# Patient Record
Sex: Female | Born: 1950 | Race: White | Hispanic: No | Marital: Married | Smoking: Never smoker
Health system: Southern US, Community
[De-identification: ages and names within clinical notes are randomized; demographics above are authoritative.]

## PROBLEM LIST (undated history)

## (undated) DIAGNOSIS — N200 Calculus of kidney: Secondary | ICD-10-CM

## (undated) DIAGNOSIS — E785 Hyperlipidemia, unspecified: Secondary | ICD-10-CM

## (undated) DIAGNOSIS — M1990 Unspecified osteoarthritis, unspecified site: Secondary | ICD-10-CM

## (undated) DIAGNOSIS — J189 Pneumonia, unspecified organism: Secondary | ICD-10-CM

## (undated) DIAGNOSIS — D369 Benign neoplasm, unspecified site: Secondary | ICD-10-CM

## (undated) DIAGNOSIS — H269 Unspecified cataract: Secondary | ICD-10-CM

## (undated) DIAGNOSIS — K219 Gastro-esophageal reflux disease without esophagitis: Secondary | ICD-10-CM

## (undated) DIAGNOSIS — B019 Varicella without complication: Secondary | ICD-10-CM

## (undated) DIAGNOSIS — T8859XA Other complications of anesthesia, initial encounter: Secondary | ICD-10-CM

## (undated) DIAGNOSIS — T7840XA Allergy, unspecified, initial encounter: Secondary | ICD-10-CM

## (undated) DIAGNOSIS — I10 Essential (primary) hypertension: Secondary | ICD-10-CM

## (undated) HISTORY — DX: Pneumonia, unspecified organism: J18.9

## (undated) HISTORY — PX: SPINE SURGERY: SHX786

## (undated) HISTORY — DX: Calculus of kidney: N20.0

## (undated) HISTORY — DX: Gastro-esophageal reflux disease without esophagitis: K21.9

## (undated) HISTORY — PX: EYE SURGERY: SHX253

## (undated) HISTORY — DX: Varicella without complication: B01.9

## (undated) HISTORY — DX: Essential (primary) hypertension: I10

## (undated) HISTORY — DX: Unspecified cataract: H26.9

## (undated) HISTORY — DX: Benign neoplasm, unspecified site: D36.9

## (undated) HISTORY — DX: Allergy, unspecified, initial encounter: T78.40XA

## (undated) HISTORY — DX: Hyperlipidemia, unspecified: E78.5

## (undated) HISTORY — DX: Unspecified osteoarthritis, unspecified site: M19.90

---

## 2004-11-15 ENCOUNTER — Ambulatory Visit: Payer: Self-pay | Admitting: Unknown Physician Specialty

## 2004-11-17 ENCOUNTER — Ambulatory Visit: Payer: Self-pay | Admitting: Urology

## 2004-11-18 ENCOUNTER — Ambulatory Visit: Payer: Self-pay | Admitting: Urology

## 2005-12-06 ENCOUNTER — Ambulatory Visit: Payer: Self-pay | Admitting: Unknown Physician Specialty

## 2006-12-05 ENCOUNTER — Ambulatory Visit: Payer: Self-pay | Admitting: Gastroenterology

## 2006-12-07 ENCOUNTER — Ambulatory Visit: Payer: Self-pay | Admitting: Unknown Physician Specialty

## 2007-12-11 ENCOUNTER — Ambulatory Visit: Payer: Self-pay | Admitting: Unknown Physician Specialty

## 2008-02-28 ENCOUNTER — Ambulatory Visit: Payer: Self-pay | Admitting: Specialist

## 2008-05-21 ENCOUNTER — Ambulatory Visit: Payer: Self-pay | Admitting: Specialist

## 2008-10-24 DIAGNOSIS — D369 Benign neoplasm, unspecified site: Secondary | ICD-10-CM

## 2008-10-24 HISTORY — DX: Benign neoplasm, unspecified site: D36.9

## 2008-12-15 ENCOUNTER — Ambulatory Visit: Payer: Self-pay | Admitting: Internal Medicine

## 2009-12-17 ENCOUNTER — Ambulatory Visit: Payer: Self-pay | Admitting: Internal Medicine

## 2010-10-04 ENCOUNTER — Ambulatory Visit: Payer: Self-pay | Admitting: Specialist

## 2010-11-19 ENCOUNTER — Ambulatory Visit: Payer: Self-pay | Admitting: Anesthesiology

## 2010-12-08 ENCOUNTER — Ambulatory Visit: Payer: Self-pay | Admitting: Anesthesiology

## 2010-12-18 ENCOUNTER — Ambulatory Visit: Payer: Self-pay | Admitting: Surgery

## 2010-12-21 ENCOUNTER — Ambulatory Visit: Payer: Self-pay | Admitting: Surgery

## 2010-12-21 ENCOUNTER — Ambulatory Visit: Payer: Self-pay | Admitting: Internal Medicine

## 2010-12-22 ENCOUNTER — Ambulatory Visit: Payer: Self-pay | Admitting: Urology

## 2011-03-25 ENCOUNTER — Other Ambulatory Visit (HOSPITAL_COMMUNITY): Payer: Self-pay | Admitting: Neurosurgery

## 2011-03-25 ENCOUNTER — Encounter (HOSPITAL_COMMUNITY)
Admission: RE | Admit: 2011-03-25 | Discharge: 2011-03-25 | Disposition: A | Discharge status: Home / Self Care | Payer: BC Managed Care – PPO | Source: Ambulatory Visit | Attending: Neurosurgery | Admitting: Neurosurgery

## 2011-03-25 ENCOUNTER — Ambulatory Visit (HOSPITAL_COMMUNITY)
Admission: RE | Admit: 2011-03-25 | Discharge: 2011-03-25 | Disposition: A | Discharge status: Home / Self Care | Payer: BC Managed Care – PPO | Source: Ambulatory Visit | Attending: Neurosurgery | Admitting: Neurosurgery

## 2011-03-25 DIAGNOSIS — M5416 Radiculopathy, lumbar region: Secondary | ICD-10-CM

## 2011-03-25 DIAGNOSIS — Z01818 Encounter for other preprocedural examination: Secondary | ICD-10-CM | POA: Insufficient documentation

## 2011-03-25 DIAGNOSIS — Z01810 Encounter for preprocedural cardiovascular examination: Secondary | ICD-10-CM | POA: Insufficient documentation

## 2011-03-25 DIAGNOSIS — Z01812 Encounter for preprocedural laboratory examination: Secondary | ICD-10-CM | POA: Insufficient documentation

## 2011-03-25 DIAGNOSIS — IMO0002 Reserved for concepts with insufficient information to code with codable children: Secondary | ICD-10-CM | POA: Insufficient documentation

## 2011-03-25 DIAGNOSIS — M47816 Spondylosis without myelopathy or radiculopathy, lumbar region: Secondary | ICD-10-CM

## 2011-03-25 DIAGNOSIS — M47817 Spondylosis without myelopathy or radiculopathy, lumbosacral region: Secondary | ICD-10-CM | POA: Insufficient documentation

## 2011-03-25 LAB — COMPREHENSIVE METABOLIC PANEL
AST: 31 U/L (ref 0–37)
BUN: 10 mg/dL (ref 6–23)
CO2: 30 mEq/L (ref 19–32)
Calcium: 9.6 mg/dL (ref 8.4–10.5)
Chloride: 102 mEq/L (ref 96–112)
Creatinine, Ser: 0.64 mg/dL (ref 0.4–1.2)
GFR calc Af Amer: >60 mL/min (ref >60)
GFR calc non Af Amer: >60 mL/min (ref >60)
Glucose, Bld: 87 mg/dL (ref 70–99)
Total Bilirubin: 0.4 mg/dL (ref 0.3–1.2)

## 2011-03-25 LAB — TYPE AND SCREEN
ABO/RH(D): B POS
Antibody Screen: NEGATIVE

## 2011-03-25 LAB — SURGICAL PCR SCREEN: Staphylococcus aureus: POSITIVE — AB

## 2011-03-25 LAB — CBC
HCT: 40.5 % (ref 36.0–46.0)
Hemoglobin: 13.7 g/dL (ref 12.0–15.0)
MCH: 31.3 pg (ref 26.0–34.0)
MCHC: 33.8 g/dL (ref 30.0–36.0)
MCV: 92.5 fL (ref 78.0–100.0)
RBC: 4.38 MIL/uL (ref 3.87–5.11)

## 2011-03-25 LAB — ABO/RH: ABO/RH(D): B POS

## 2011-03-31 ENCOUNTER — Inpatient Hospital Stay (HOSPITAL_COMMUNITY)
Admission: RE | Admit: 2011-03-31 | Discharge: 2011-04-01 | DRG: 756 | Disposition: A | Discharge status: Home / Self Care | Payer: BC Managed Care – PPO | Source: Ambulatory Visit | Attending: Neurosurgery | Admitting: Neurosurgery

## 2011-03-31 ENCOUNTER — Inpatient Hospital Stay (HOSPITAL_COMMUNITY): Payer: BC Managed Care – PPO

## 2011-03-31 DIAGNOSIS — M48061 Spinal stenosis, lumbar region without neurogenic claudication: Principal | ICD-10-CM | POA: Diagnosis present

## 2011-03-31 DIAGNOSIS — I10 Essential (primary) hypertension: Secondary | ICD-10-CM | POA: Diagnosis present

## 2011-03-31 DIAGNOSIS — M4310 Spondylolisthesis, site unspecified: Secondary | ICD-10-CM | POA: Diagnosis present

## 2011-04-21 NOTE — Op Note (Signed)
[name], [name]                ACCOUNT NO.:  [id number]  MEDICAL RECORD NO.:  [id number]  LOCATION:  3038                         FACILITY:  [facility]  PHYSICIAN:  Reinaldo Meeker, M.D. 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  PROCEDURE:  Left L4-5, maximum access TLIF with PEEK interbody spacer followed by invasive GBR pedicle screw fixation of L4-5.  SURGEON:  Reinaldo Meeker, MD.  PROCEDURE IN DETAIL:  After placing in the prone position, the patient's back was prepped and draped in the usual sterile fashion.  AP fluoroscopy was used prior to incision to identify the pedicles of L4- L5.  Small incisions were made lateral to the pedicles and Jamshidi needle was passed from lateral to medial direction through the pedicle. This was followed in AP and lateral fluoroscopy and also with intraoperative EMG monitoring and showed no breech of the pedicle. Jamshidi needles were removed.  Guidewires were placed through.  The two incisions were then connected.  Sequential dilation was carried out and then we tapped at L4 and placed a 6.5 x 40-mm screw at that level and tapped at L5, and placed a 6.5 x 35-mm screw at that level.  This was followed in excellent position with the retractor blades attached. Tract was then attached to the screws through the hoop shim and we then opened the retractor in standard fashion after secured into the table arm.  We then placed the mediolateral blade.  We then spent approximately 10 minutes removing soft tissue from the anatomy to identify the lamina of L4-L5 and the facet joint.  We then did a generous laminotomy by removing the inferior three-quarters of the L4 lamina, the medial three-quarters of facet joint, the  superior third of the L5 lamina.  Residual bone and bone dressing were removed and saved for use later in the case.  L4-L5 nerve roots were both identified and decompressed more so than needed for transforaminal lumbar interbody fusion.  We then coagulated the disk and thoroughly cleaned it out with a variety of pituitary rongeurs and curettes.  At this time, we did sequential distraction and distracted the disk space up to 10 mm size. We felt this was a good fit for cage.  We used rotating cutter.  Prior to placing the interbody spacer, we placed a mixture of EquivaBone, autologous bone, and OsteoSet Plus in the midline to help with the interbody fusion.  We then placed a 10-mm TLIF cage in standard fashion after they were filled with the same mixture.  Fluoroscopy showed to be in excellent position. We kicked into a transverse position.  We then irrigated copiously and any bleeding controlled bipolar coagulation and Gelfoam.  Then removed the hoop shim from the top of the screws, attached to the polyaxial heads.  We then measured and chose a 25-mm dual ball rod.  We then advanced the screws in final position, attached the dual ball rod from bottom to the top, and then placed  the top loading nuts with torque and counter torque.  Final fluoroscopy in AP and lateral direction showed excellent placement of the interbody spacer, screws, and rod.  Wound was then closed in multiple layers of Vicryl in the muscle fascia, subcutaneous, subcuticular tissues, and staples were placed on the skin.  Sterile dressings then applied.  The patient was extubated and taken to recovery room in stable condition.          ______________________________ Reinaldo Meeker, M.D.     ROK/MEDQ  D:  03/31/2011  T:  04/01/2011  Job:  [id number]  Electronically Signed by Aliene Beams M.D. on 04/21/2011 10:36:10 AM

## 2011-09-06 ENCOUNTER — Other Ambulatory Visit: Payer: Self-pay | Admitting: Internal Medicine

## 2011-11-02 ENCOUNTER — Other Ambulatory Visit: Payer: Self-pay | Admitting: Internal Medicine

## 2012-01-05 ENCOUNTER — Ambulatory Visit: Payer: Self-pay | Admitting: Internal Medicine

## 2012-01-24 ENCOUNTER — Other Ambulatory Visit: Payer: Self-pay | Admitting: Internal Medicine

## 2012-01-24 NOTE — Telephone Encounter (Signed)
Left message asking patient to return my call.

## 2012-01-24 NOTE — Telephone Encounter (Signed)
Patient has not been seen yet.

## 2012-01-25 LAB — HM COLONOSCOPY: HM Colonoscopy: NORMAL

## 2012-01-26 MED ORDER — LISINOPRIL 5 MG PO TABS: 5.0000 mg | ORAL_TABLET | Freq: Every day | ORAL | Status: DC

## 2012-02-13 ENCOUNTER — Ambulatory Visit: Payer: Self-pay | Admitting: Internal Medicine

## 2012-02-26 IMAGING — CR DG CHEST 2V
2 series · 2 of 2 positions shown · non-contrast
Comparison: None.

CLINICAL DATA: Preop for cervical spine fusion

CHEST - 2 VIEW

[view not recorded (1 of 2)]
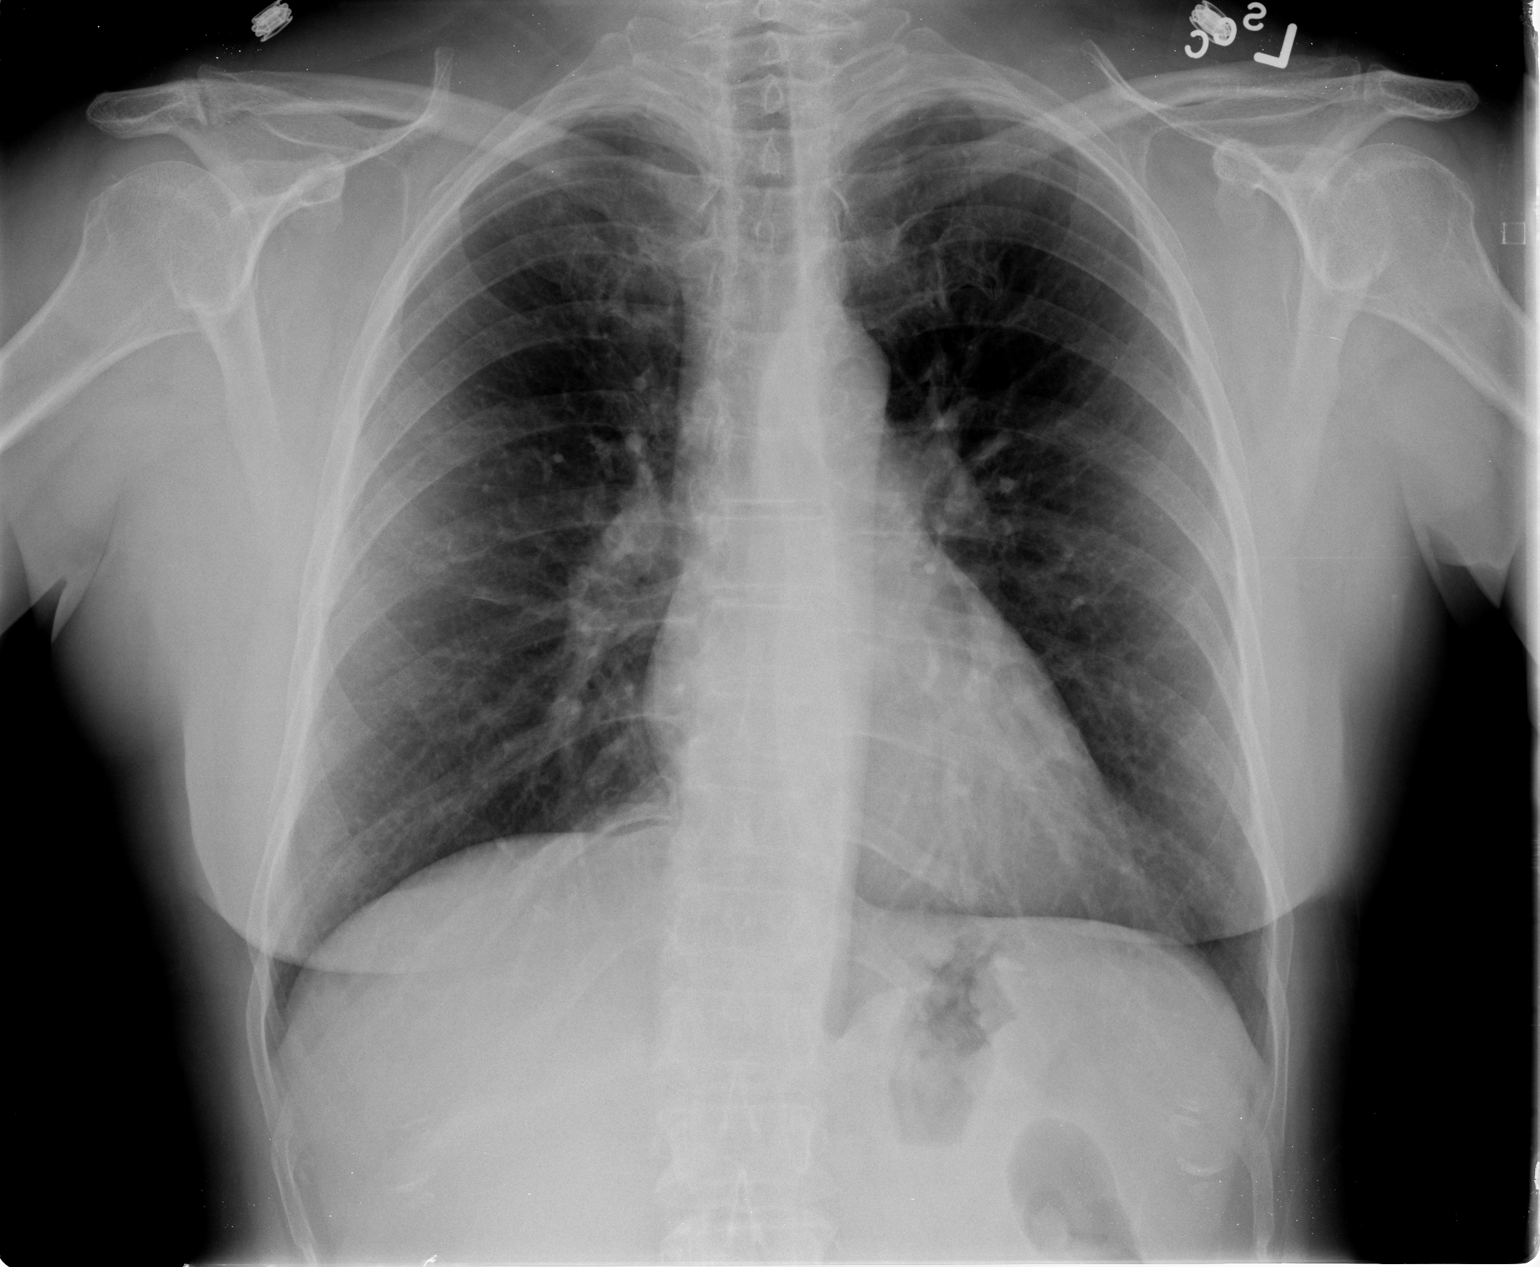

[view not recorded (2 of 2)]
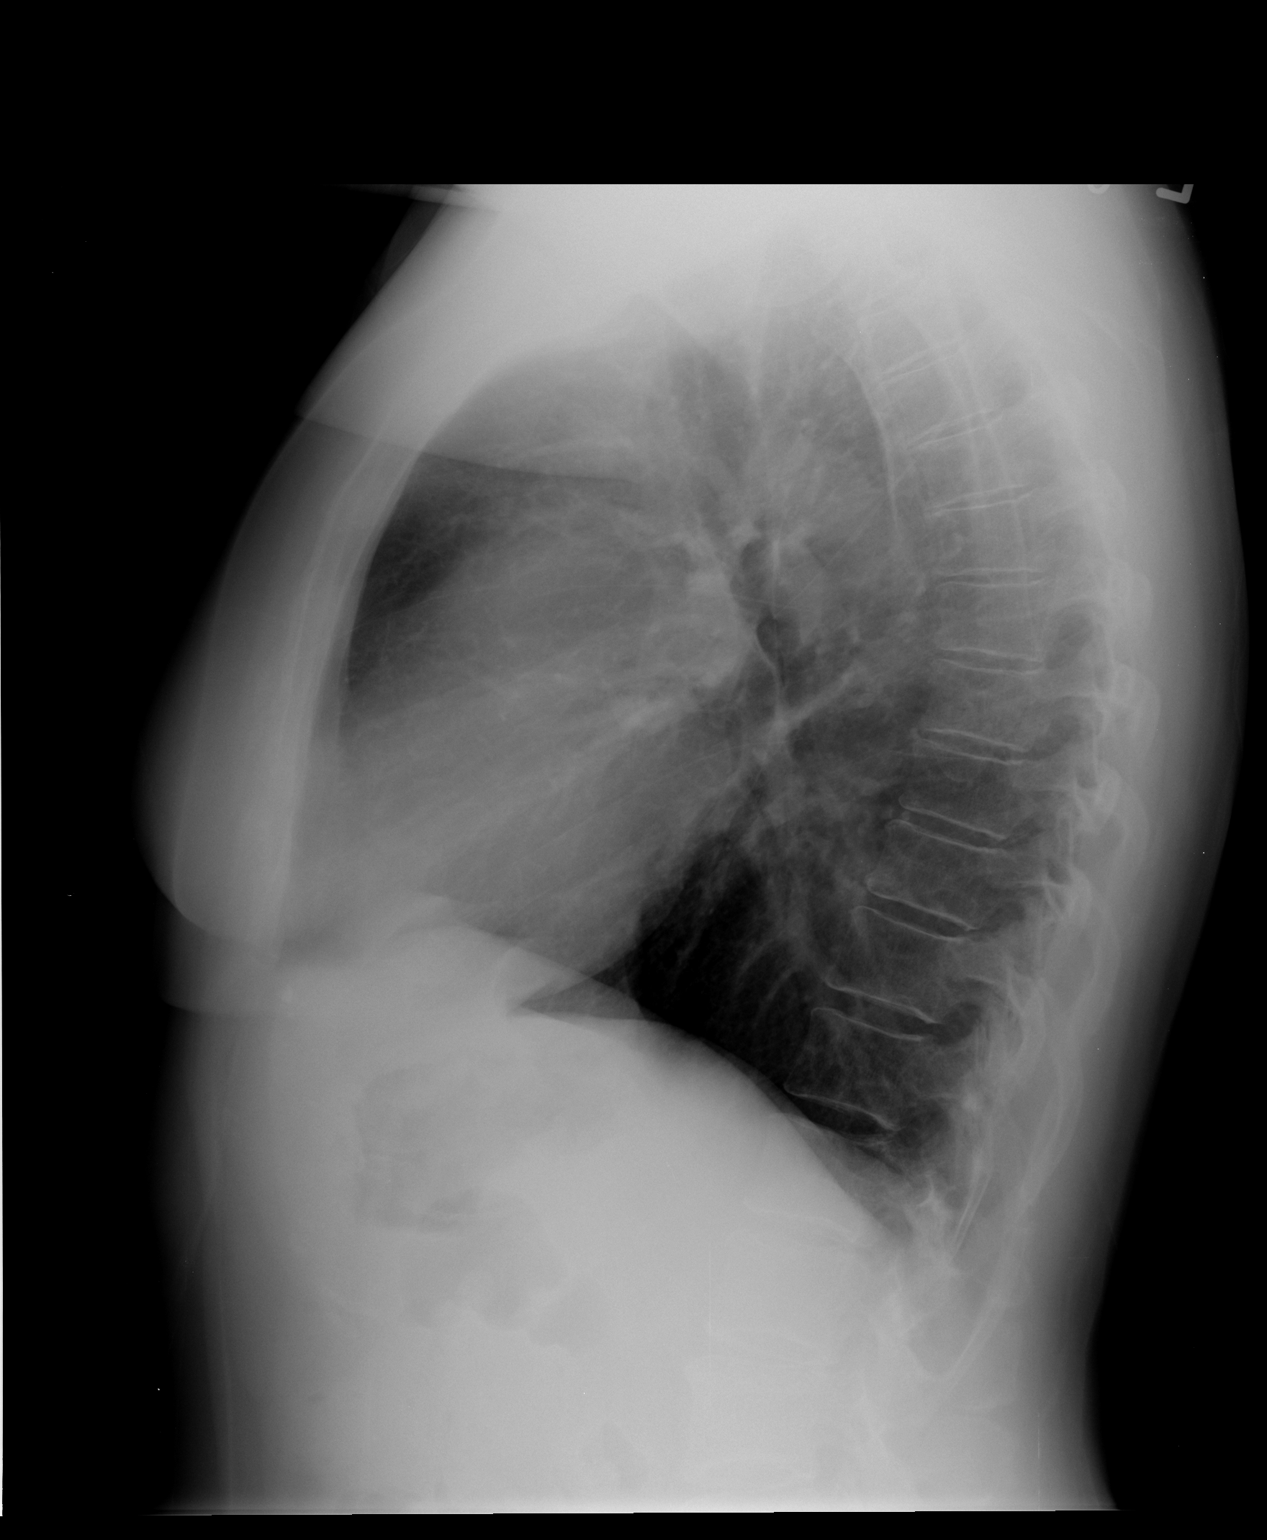

[2 of 2 positions shown; findings below may reference images not displayed]

FINDINGS: The lungs are clear.  Mediastinal contours appear normal.
The heart is within normal limits in size.  No bony abnormality is
seen.
IMPRESSION: No active lung disease.

## 2012-03-03 IMAGING — RF DG LUMBAR SPINE 2-3V
1 series · 2 of 2 positions shown · non-contrast
Comparison: None.

CLINICAL DATA: L4-5 fusion.

LUMBAR SPINE - 2-3 VIEW

[Series 1: run · 2 of 2 slices shown]
[im 1/2]
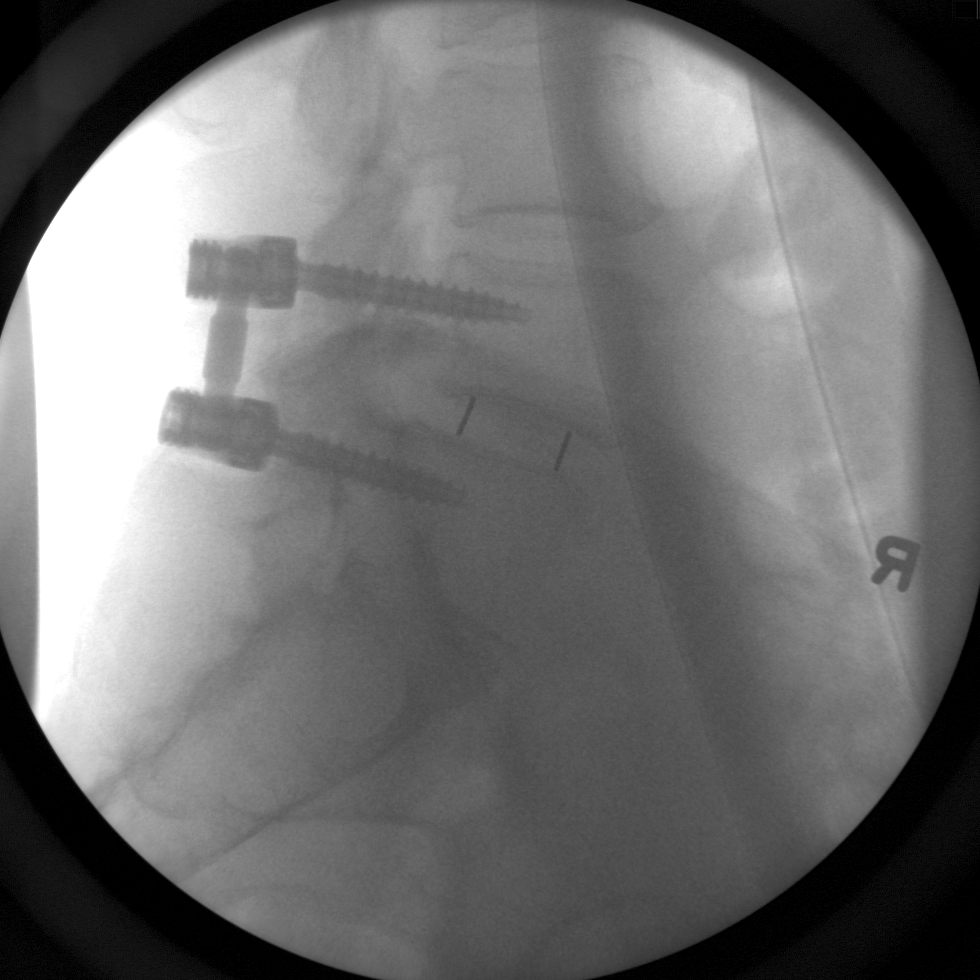
[im 2/2]
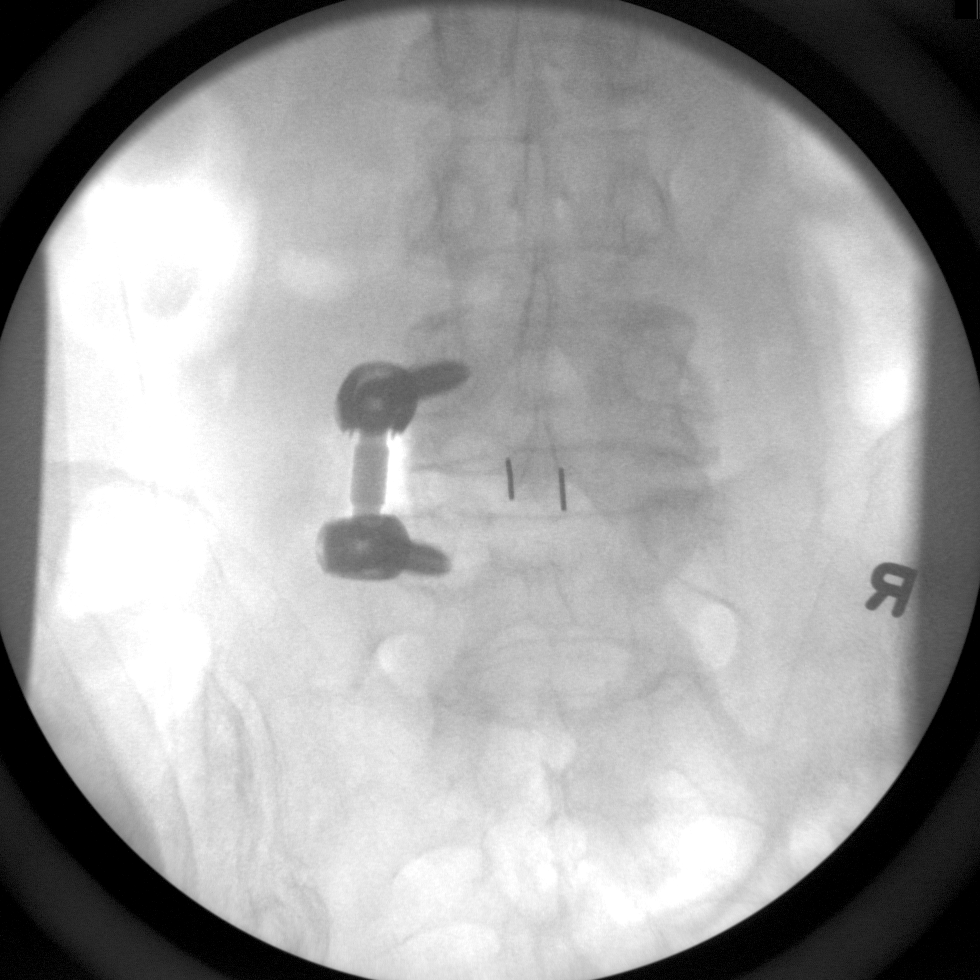

[2 of 2 positions shown; findings below may reference images not displayed]

FINDINGS: Two intraoperative spot images demonstrate posterior
fusion with left pedicle screws at L4-5.  Slight anterolisthesis of
L4 on L5.  No hardware complicating feature.
IMPRESSION: L4-5 posterior fusion.

## 2013-02-13 ENCOUNTER — Ambulatory Visit: Payer: Self-pay | Admitting: Internal Medicine

## 2013-06-26 ENCOUNTER — Encounter: Payer: Self-pay | Admitting: Internal Medicine

## 2013-06-26 ENCOUNTER — Ambulatory Visit (INDEPENDENT_AMBULATORY_CARE_PROVIDER_SITE_OTHER): Payer: BC Managed Care – PPO | Admitting: Internal Medicine

## 2013-06-26 VITALS — BP 132/72 | HR 91 | Temp 98.2°F | Resp 14 | Ht 62.0 in | Wt 140.5 lb

## 2013-06-26 DIAGNOSIS — Z832 Family history of diseases of the blood and blood-forming organs and certain disorders involving the immune mechanism: Secondary | ICD-10-CM

## 2013-06-26 DIAGNOSIS — I10 Essential (primary) hypertension: Secondary | ICD-10-CM

## 2013-06-26 DIAGNOSIS — E785 Hyperlipidemia, unspecified: Secondary | ICD-10-CM

## 2013-06-26 NOTE — Progress Notes (Signed)
Patient ID: [name], female   DOB: Aug 26, 1951, 62 y.o.   MRN: [id number]   Patient Active Problem List   Diagnosis Date Noted  . Essential hypertension, benign 06/26/2013  . Other and unspecified hyperlipidemia 06/26/2013  . Family history of clotting disorder 06/26/2013    Subjective:  CC:   Chief Complaint  Patient presents with  . Establish Care    HPI:   [name] is a 62 y.o. female who presents as a new patient to establish primary care with themultiple chronic complaints .   Former patient of Veneda Melter for the last 2 years,  Due for blood work.  To include a workup for a clotting disorder bc of a half sister who was diagnosed with one recently ,  However has no history of VTE or bleeding diathesis and had uneventful back surgery a few years ago with no problems  Mother died in 2023-05-20 in [place] .  Has been travelling and spending a lot of time with family and friends, and [place] in [place],  [place] and [place].  Chronic sinus problems.resolved for several years,  Now recurring since late august.  Fullness in ears, and sinuses worse on the right side.,  Has discomfort with chewing   Developed allergic reaction to mildew while mowing grass at the [place].   Aug 26th developed dizziness, facial pain while doing the yardwork,  Lasted several days.   Started using nasonex .  Having some dizziness with sudden position changes .  Has not taken meclizine or decongestants because she has developed elevated blood pressure  Prior treatment for chronic sinusitis with prolonged septra dose for 14 days (stopped it early from 21 intended due to allergic reaction ) by [facility] for 3-4 years was symptom free, And is scheduled to see [employer] tomorrow.    History of back surgery June 2012  By Kritzer L4-L5 fusion with instrumentation for persistent back pain and radiculitis and spondylolisthesis   History of multiple tick bites with diagnosis of erlichiosis  remotely. Had 3 months of intermittent symptoms of arthralgias, tinnitus,    Had tick bites in 20-May-2023 with "hot spots" (2nd [facility] joint right hand ,  Right knee) that lasted a few hours.   Past Medical History  Diagnosis Date  . Arthritis   . Chicken pox   . GERD (gastroesophageal reflux disease)   . Allergy   . Hyperlipidemia   . Hypertension   . Adenomatous polyps 2010    colon  . Pneumonia     Past Surgical History  Procedure Laterality Date  . Spine surgery      Family History  Problem Relation Age of Onset  . Cancer Mother   . Stroke Mother   . Heart disease Mother   . Hypertension Mother   . Diabetes Mother   . Heart disease Father   . Hypertension Father     History   Social History  . Marital Status: Married    Spouse Name: N/A    Number of Children: N/A  . Years of Education: N/A   Occupational History  . Not on file.   Social History Main Topics  . Smoking status: Never Smoker   . Smokeless tobacco: Never Used  . Alcohol Use: 4.2 oz/week    7 Glasses of wine per week  . Drug Use: No  . Sexual Activity: Yes   Other Topics Concern  . Not on file   Social History Narrative  . No  narrative on file    Allergies  Allergen Reactions  . Contrast Media [Iodinated Diagnostic Agents] Swelling  . Iodine Swelling  . Milk-Related Compounds Diarrhea and Nausea And Vomiting  . Corn-Containing Products     headaches  . Penicillins     Childhood does not know reaction  . Sulfa Antibiotics Hives     Review of Systems:   The remainder of the review of systems was negative except those addressed in the HPI.    Objective:  BP 132/72  Pulse 91  Temp(Src) 98.2 F (36.8 C) (Oral)  Resp 14  Ht 5\' 2"  (1.575 m)  Wt 140 lb 8 oz (63.73 kg)  BMI 25.69 kg/m2  SpO2 98%  General appearance: alert, cooperative and appears stated age Ears: normal TM's and external ear canals both ears Throat: lips, mucosa, and tongue normal; teeth and gums normal Neck: no  adenopathy, no carotid bruit, supple, symmetrical, trachea midline and thyroid not enlarged, symmetric, no tenderness/mass/nodules Back: symmetric, no curvature. ROM normal. No CVA tenderness. Lungs: clear to auscultation bilaterally Heart: regular rate and rhythm, S1, S2 normal, no murmur, click, rub or gallop Abdomen: soft, non-tender; bowel sounds normal; no masses,  no organomegaly Pulses: 2+ and symmetric Skin: Skin color, texture, turgor normal. No rashes or lesions Lymph nodes: Cervical, supraclavicular, and axillary nodes normal.  Assessment and Plan:  Family history of clotting disorder Unclear what clotting disorder .  Patient ha sno history of VTE and no history of excessive bleeding with prior surgeries but is requesting screening.  Will wait for records.    Other and unspecified hyperlipidemia She is opposed to resuming statins sincde her memory reportedly improved.  Return for repeat panel   Essential hypertension, benign Well controlled on current regimen. Renal function reportedly stable, no changes today.   A total of 45 minutes was spent with patient more than half of which was spent in counseling, reviewing records from other prviders and coordination of care.  Updated Medication List Outpatient Encounter Prescriptions as of 06/26/2013  Medication Sig Dispense Refill  . lisinopril (PRINIVIL,ZESTRIL) 5 MG tablet Take 1 tablet (5 mg total) by mouth daily.  30 tablet  0  . mometasone (NASONEX) 50 MCG/ACT nasal spray Place 2 sprays into the nose as needed.      Marland Kitchen omeprazole (PRILOSEC) 20 MG capsule TAKE ONE CAPSULE BY MOUTH EVERY DAY  90 capsule  3  . [DISCONTINUED] SINGULAIR 10 MG tablet TAKE 1 TABLET AT BEDTIME  30 tablet  5   No facility-administered encounter medications on file as of 06/26/2013.     Orders Placed This Encounter  Procedures  . HM MAMMOGRAPHY  . HM DEXA SCAN  . HM PAP SMEAR  . HM COLONOSCOPY    No Follow-up on file.

## 2013-06-26 NOTE — Patient Instructions (Addendum)
You can try Afrin nasal spray twice daily for a maximum of 5 daysif your dizziness is still bothering you   Sudafed PE is an oral decongestant that you may also tolerate without a significant rise in blood pressure. 10 mg every 6 hours as needed   If you find the order for the clotting test,  Let me know   We will schedule your annual physical  In 3 months and fasting labs as soon as we get the records from Dr Alison Murray.

## 2013-06-29 ENCOUNTER — Encounter: Payer: Self-pay | Admitting: Internal Medicine

## 2013-06-29 NOTE — Assessment & Plan Note (Signed)
Well controlled on current regimen. Renal function  reportedly  stable, no changes today. 

## 2013-06-29 NOTE — Assessment & Plan Note (Signed)
Unclear what clotting disorder .  Patient ha sno history of VTE and no history of excessive bleeding with prior surgeries but is requesting screening.  Will wait for records.

## 2013-06-29 NOTE — Assessment & Plan Note (Signed)
She is opposed to resuming statins sincde her memory reportedly improved.  Return for repeat panel

## 2013-08-27 ENCOUNTER — Telehealth: Payer: Self-pay | Admitting: Internal Medicine

## 2013-08-27 NOTE — Telephone Encounter (Signed)
Chart updated

## 2013-08-27 NOTE — Telephone Encounter (Signed)
Mr. Lei brought in script from [facility] stating that [name] has received her flu shot.  Copy made for scanning.

## 2013-08-28 ENCOUNTER — Other Ambulatory Visit: Payer: Self-pay | Admitting: Internal Medicine

## 2013-09-26 ENCOUNTER — Other Ambulatory Visit (HOSPITAL_COMMUNITY)
Admission: RE | Admit: 2013-09-26 | Discharge: 2013-09-26 | Disposition: A | Discharge status: Home / Self Care | Payer: BC Managed Care – PPO | Source: Ambulatory Visit | Attending: Internal Medicine | Admitting: Internal Medicine

## 2013-09-26 ENCOUNTER — Ambulatory Visit (INDEPENDENT_AMBULATORY_CARE_PROVIDER_SITE_OTHER): Payer: BC Managed Care – PPO | Admitting: Internal Medicine

## 2013-09-26 ENCOUNTER — Encounter: Payer: Self-pay | Admitting: Internal Medicine

## 2013-09-26 VITALS — BP 122/70 | HR 80 | Temp 98.7°F | Resp 12 | Ht 62.0 in | Wt 136.5 lb

## 2013-09-26 DIAGNOSIS — Z124 Encounter for screening for malignant neoplasm of cervix: Secondary | ICD-10-CM

## 2013-09-26 DIAGNOSIS — Z23 Encounter for immunization: Secondary | ICD-10-CM

## 2013-09-26 DIAGNOSIS — Z888 Allergy status to other drugs, medicaments and biological substances status: Secondary | ICD-10-CM

## 2013-09-26 DIAGNOSIS — R5381 Other malaise: Secondary | ICD-10-CM

## 2013-09-26 DIAGNOSIS — J329 Chronic sinusitis, unspecified: Secondary | ICD-10-CM

## 2013-09-26 DIAGNOSIS — Z789 Other specified health status: Secondary | ICD-10-CM

## 2013-09-26 DIAGNOSIS — Z1239 Encounter for other screening for malignant neoplasm of breast: Secondary | ICD-10-CM

## 2013-09-26 DIAGNOSIS — H6590 Unspecified nonsuppurative otitis media, unspecified ear: Secondary | ICD-10-CM

## 2013-09-26 DIAGNOSIS — Z01419 Encounter for gynecological examination (general) (routine) without abnormal findings: Secondary | ICD-10-CM | POA: Insufficient documentation

## 2013-09-26 DIAGNOSIS — E785 Hyperlipidemia, unspecified: Secondary | ICD-10-CM

## 2013-09-26 DIAGNOSIS — Z1151 Encounter for screening for human papillomavirus (HPV): Secondary | ICD-10-CM | POA: Insufficient documentation

## 2013-09-26 DIAGNOSIS — E559 Vitamin D deficiency, unspecified: Secondary | ICD-10-CM

## 2013-09-26 DIAGNOSIS — Z0000 Encounter for general adult medical examination without abnormal findings: Secondary | ICD-10-CM

## 2013-09-26 LAB — CBC WITH DIFFERENTIAL/PLATELET
Eosinophils Absolute: 0.1 10*3/uL (ref 0.0–0.7)
Lymphocytes Relative: 11.2 % — ABNORMAL LOW (ref 12.0–46.0)
MCHC: 34.1 g/dL (ref 30.0–36.0)
MCV: 90.7 fl (ref 78.0–100.0)
Monocytes Absolute: 0.6 10*3/uL (ref 0.1–1.0)
Neutrophils Relative %: 79.8 % — ABNORMAL HIGH (ref 43.0–77.0)
Platelets: 186 10*3/uL (ref 150.0–400.0)
RBC: 4.78 Mil/uL (ref 3.87–5.11)
WBC: 8.1 10*3/uL (ref 4.5–10.5)

## 2013-09-26 LAB — COMPREHENSIVE METABOLIC PANEL
ALT: 21 U/L (ref 0–35)
AST: 22 U/L (ref 0–37)
Albumin: 4.2 g/dL (ref 3.5–5.2)
BUN: 14 mg/dL (ref 6–23)
CO2: 28 mEq/L (ref 19–32)
Calcium: 9.6 mg/dL (ref 8.4–10.5)
Chloride: 103 mEq/L (ref 96–112)
Creatinine, Ser: 0.8 mg/dL (ref 0.4–1.2)
GFR: 77.79 mL/min (ref >60.00)
Potassium: 4.5 mEq/L (ref 3.5–5.1)

## 2013-09-26 LAB — LIPID PANEL
Cholesterol: 244 mg/dL — ABNORMAL HIGH (ref 0–200)
Triglycerides: 76 mg/dL (ref 0.0–149.0)

## 2013-09-26 LAB — TSH: TSH: 2.46 u[IU]/mL (ref 0.35–5.50)

## 2013-09-26 MED ORDER — LEVOFLOXACIN 500 MG PO TABS: 500.0000 mg | ORAL_TABLET | Freq: Every day | ORAL | Status: DC

## 2013-09-26 MED ORDER — AZELASTINE-FLUTICASONE 137-50 MCG/ACT NA SUSP: 2.0000 | Freq: Every day | NASAL | Status: DC

## 2013-09-26 NOTE — Patient Instructions (Addendum)
You had your annual wellness exam today  We will schedule your 3D mammogram next year at [facility] at the appropriate time on or after April .  You received the Shingles vaccine today.  We will contact you with the bloodwork results  Tell Ed that I do NOT check my e mail every day because I prefer to remain "old school"  But here it is [email or web address]   You have mild irritation of the right eardrum .  It does not appear to be a bacterial infection (yet) but if your sympotms do not improve with the following regimen,  Start the levaquin and a probiotic.    I also advise use of the following OTC meds to help with your other symptoms.   Take generic OTC benadryl 25 mg up to every 8 hours for the drainage,  Sudafed PE  10 to 30 mg every 8 hours for the congestion, you may substitute Afrin nasal spray for the nighttime dose of sudafed PE  If needed to prevent insomnia.  flushes your sinuses twice daily with Simply Saline (do over the sink because if you do it right you will spit out globs of mucus)  Gargle with salt water as needed for sore throat.   Please take a probiotic ( Align, Floraque or Culturelle) any time you take an  antibiotic to prevent a serious antibiotic associated diarrhea  Called clostirudium dificile colitis (as well as to prevent  a vaginal yeast infection)

## 2013-09-26 NOTE — Progress Notes (Signed)
Patient ID: [name], female   DOB: [birth year], 62 y.o.   MRN: [id number]  Subjective:     [name] is a 62 y.o. female and is here for a comprehensive physical exam. The patient reports Annual exam persistent sinus congestion since August. She was treated recently by [facility] after her last visit with [employer] maneuvers and Dymista for vertigo and eustachian tube dysfunction.  Has not had sinus problems for several years since [facility] treated her for chronic sinusitis with extended therapy Septra (taken for 2-3 weeks until she developed a rash) .  Lost voice yesterday, has been having a lot of  PND   Mammogram was done in April at [place].     History   Social History  . Marital Status: Married    Spouse Name: N/A    Number of Children: N/A  . Years of Education: N/A   Occupational History  . Not on file.   Social History Main Topics  . Smoking status: Never Smoker   . Smokeless tobacco: Never Used  . Alcohol Use: 4.2 oz/week    7 Glasses of wine per week  . Drug Use: No  . Sexual Activity: Yes   Other Topics Concern  . Not on file   Social History Narrative  . No narrative on file   Health Maintenance  Topic Date Due  . Influenza Vaccine  05/24/2014  . Mammogram  02/14/2015  . Pap Smear  09/26/2016  . Tetanus/tdap  12/24/2020  . Colonoscopy  01/24/2022  . Zostavax  Completed    The following portions of the patient's history were reviewed and updated as appropriate: allergies, current medications, past family history, past medical history, past social history, past surgical history and problem list.  Review of Systems A comprehensive review of systems was negative.   Objective:   BP 122/70  Pulse 80  Temp(Src) 98.7 F (37.1 C) (Oral)  Resp 12  Ht 5\' 2"  (1.575 m)  Wt 136 lb 8 oz (61.916 kg)  BMI 24.96 kg/m2  SpO2 97%  General Appearance:    Alert, cooperative, no distress, appears stated age  Head:    Normocephalic, without  obvious abnormality, atraumatic  Eyes:    PERRL, conjunctiva/corneas clear, EOM's intact, fundi    benign, both eyes  Ears:    Normal TM's and external ear canals, both ears  Nose:   Nares normal, septum midline, mucosa normal, no drainage    or sinus tenderness  Throat:   Lips, mucosa, and tongue normal; teeth and gums normal  Neck:   Supple, symmetrical, trachea midline, no adenopathy;    thyroid:  no enlargement/tenderness/nodules; no carotid   bruit or JVD  Back:     Symmetric, no curvature, ROM normal, no CVA tenderness  Lungs:     Clear to auscultation bilaterally, respirations unlabored  Chest Wall:    No tenderness or deformity   Heart:    Regular rate and rhythm, S1 and S2 normal, no murmur, rub   or gallop  Breast Exam:    No tenderness, masses, or nipple abnormality  Abdomen:     Soft, non-tender, bowel sounds active all four quadrants,    no masses, no organomegaly  Genitalia:    Normal female without lesion, discharge or tenderness  Rectal:    deferred  Extremities:   Extremities normal, atraumatic, no cyanosis or edema  Pulses:   2+ and symmetric all extremities  Skin:   Skin color, texture, turgor  normal, no rashes or lesions  Lymph nodes:   Cervical, supraclavicular, and axillary nodes normal  Neurologic:   CNII-XII intact, normal strength, sensation and reflexes    throughout   Assessment and Plan:  Routine general medical examination at a health care facility Annual comprehensive exam was done including breast, pelvic and PAP smear. All screenings have been addressed .   Screening for malignant neoplasm of the cervix PAP smear was normal,  HPV screen negative.  Repeat in 2 to 3  years    Other and unspecified hyperlipidemia Treated LDL in Feb was 123.  Untreated LDL is 181,  She stopped statin therapy in March due to perceived deficits in short term memory which have reportedly improved in March .  10 yr risk of CAD is 8.8% using Framingham risk calculator.  New  ACC guidelines recommend starting patients aged 83 or higher on moderate intensity statin therapy for LDL between 70-189 and 10 yr risk of CAD >  7.5% .  Lab Results  Component Value Date   CHOL 244* 09/26/2013   HDL 52.90 09/26/2013   LDLDIRECT 181.4 09/26/2013   TRIG 76.0 09/26/2013   CHOLHDL 5 09/26/2013      Statin intolerance She stopped statin therapy in March due to perceived deficits in short term memory which have reportedly improved in March .  Sinusitis, chronic Exam was abnormal with bilateral injected TMs and sinus congestion.  She has had symptloms singce August. Levaquin, Dymista, decongestant, probiotic.  Referral to [facility] per patient request   Updated Medication List Outpatient Encounter Prescriptions as of 09/26/2013  Medication Sig  . Azelastine-Fluticasone 137-50 MCG/ACT SUSP Place 1 spray into the nose 2 (two) times daily.  Marland Kitchen omeprazole (PRILOSEC) 20 MG capsule TAKE ONE CAPSULE BY MOUTH EVERY DAY  . Azelastine-Fluticasone (DYMISTA) 137-50 MCG/ACT SUSP Place 2 Squirts into the nose daily. In each nostril  . levofloxacin (LEVAQUIN) 500 MG tablet Take 1 tablet (500 mg total) by mouth daily.  Marland Kitchen lisinopril (PRINIVIL,ZESTRIL) 5 MG tablet Take 1 tablet (5 mg total) by mouth daily.  . [DISCONTINUED] levofloxacin (LEVAQUIN) 500 MG tablet Take 1 tablet (500 mg total) by mouth daily.  . [DISCONTINUED] mometasone (NASONEX) 50 MCG/ACT nasal spray Place 2 sprays into the nose as needed.

## 2013-09-26 NOTE — Progress Notes (Signed)
Pre-visit discussion using our clinic review tool. No additional management support is needed unless otherwise documented below in the visit note.  

## 2013-09-28 ENCOUNTER — Encounter: Payer: Self-pay | Admitting: Internal Medicine

## 2013-09-28 DIAGNOSIS — Z0000 Encounter for general adult medical examination without abnormal findings: Secondary | ICD-10-CM | POA: Insufficient documentation

## 2013-09-28 DIAGNOSIS — J329 Chronic sinusitis, unspecified: Secondary | ICD-10-CM | POA: Insufficient documentation

## 2013-09-28 DIAGNOSIS — Z124 Encounter for screening for malignant neoplasm of cervix: Secondary | ICD-10-CM | POA: Insufficient documentation

## 2013-09-28 DIAGNOSIS — Z789 Other specified health status: Secondary | ICD-10-CM | POA: Insufficient documentation

## 2013-09-28 NOTE — Assessment & Plan Note (Signed)
She stopped statin therapy in March due to perceived deficits in short term memory which have reportedly improved in March .

## 2013-09-28 NOTE — Assessment & Plan Note (Addendum)
Exam was abnormal with bilateral injected TMs and sinus congestion.  She has had symptloms singce August. Levaquin, Dymista, decongestant, probiotic.  Referral to [facility] per patient request

## 2013-09-28 NOTE — Assessment & Plan Note (Signed)
PAP smear was normal,  HPV screen negative.  Repeat in 2 to 3  years

## 2013-09-28 NOTE — Assessment & Plan Note (Addendum)
Treated LDL in Feb was 123.  Untreated LDL is 181,  She stopped statin therapy in March due to perceived deficits in short term memory which have reportedly improved in March .  10 yr risk of CAD is 8.8% using Framingham risk calculator.  New ACC guidelines recommend starting patients aged 64 or higher on moderate intensity statin therapy for LDL between 70-189 and 10 yr risk of CAD >  7.5% .  Lab Results  Component Value Date   CHOL 244* 09/26/2013   HDL 52.90 09/26/2013   LDLDIRECT 181.4 09/26/2013   TRIG 76.0 09/26/2013   CHOLHDL 5 09/26/2013

## 2013-09-28 NOTE — Assessment & Plan Note (Signed)
Annual comprehensive exam was done including breast, pelvic and PAP smear. All screenings have been addressed .  

## 2013-09-30 ENCOUNTER — Encounter: Payer: Self-pay | Admitting: *Deleted

## 2013-10-01 LAB — HM PAP SMEAR: HM Pap smear: NORMAL

## 2013-10-31 ENCOUNTER — Telehealth: Payer: Self-pay | Admitting: *Deleted

## 2013-10-31 DIAGNOSIS — J329 Chronic sinusitis, unspecified: Secondary | ICD-10-CM

## 2013-10-31 MED ORDER — AZELASTINE-FLUTICASONE 137-50 MCG/ACT NA SUSP: 2.0000 | Freq: Every day | NASAL | Status: DC

## 2013-10-31 MED ORDER — OMEPRAZOLE 20 MG PO CPDR: DELAYED_RELEASE_CAPSULE | ORAL | Status: DC

## 2013-10-31 NOTE — Telephone Encounter (Signed)
Patient called for refill on dymista and omeprazole sent to pharmacy electronically.

## 2013-11-26 ENCOUNTER — Telehealth: Payer: Self-pay | Admitting: Emergency Medicine

## 2013-11-26 ENCOUNTER — Encounter (INDEPENDENT_AMBULATORY_CARE_PROVIDER_SITE_OTHER): Payer: Self-pay

## 2013-11-26 ENCOUNTER — Ambulatory Visit: Payer: BC Managed Care – PPO | Admitting: Internal Medicine

## 2013-11-26 ENCOUNTER — Encounter: Payer: Self-pay | Admitting: Adult Health

## 2013-11-26 ENCOUNTER — Ambulatory Visit (INDEPENDENT_AMBULATORY_CARE_PROVIDER_SITE_OTHER): Payer: BC Managed Care – PPO | Admitting: Adult Health

## 2013-11-26 VITALS — BP 130/80 | HR 80 | Temp 98.3°F | Wt 140.0 lb

## 2013-11-26 DIAGNOSIS — R300 Dysuria: Secondary | ICD-10-CM

## 2013-11-26 LAB — POCT URINALYSIS DIPSTICK
Bilirubin, UA: NEGATIVE
Glucose, UA: NEGATIVE
Ketones, UA: NEGATIVE
LEUKOCYTES UA: NEGATIVE
NITRITE UA: NEGATIVE
PH UA: 6
PROTEIN UA: NEGATIVE
Spec Grav, UA: >=1.03
UROBILINOGEN UA: 0.2

## 2013-11-26 MED ORDER — CIPROFLOXACIN HCL 250 MG PO TABS: 250.0000 mg | ORAL_TABLET | Freq: Two times a day (BID) | ORAL | Status: DC

## 2013-11-26 NOTE — Telephone Encounter (Signed)
Patient has been r/s for tomorrow on Raquel's schedule. The patient has a possible uti? Can we work her in today? Please advise

## 2013-11-26 NOTE — Progress Notes (Signed)
   Subjective:    Patient ID: [name], female    DOB: 22-Mar-1951, 63 y.o.   MRN: [id number]  HPI Patient is a 63 year old female who presents to clinic with dysuria. Symptoms have worsened in the last 4 days. She denies fever, hematuria. She has not taken any over-the-counter products.  Current Outpatient Prescriptions on File Prior to Visit  Medication Sig Dispense Refill  . Azelastine-Fluticasone (DYMISTA) 137-50 MCG/ACT SUSP Place 2 Squirts into the nose daily. In each nostril  23 g  11  . omeprazole (PRILOSEC) 20 MG capsule TAKE ONE CAPSULE BY MOUTH EVERY DAY  90 capsule  3   No current facility-administered medications on file prior to visit.     Review of Systems  Constitutional: Negative for fever and chills.  Genitourinary: Positive for dysuria, urgency and frequency. Negative for hematuria and flank pain.  All other systems reviewed and are negative.       Objective:   Physical Exam  Constitutional: She is oriented to person, place, and time. No distress.  Cardiovascular: Normal rate and regular rhythm.   Pulmonary/Chest: Effort normal. No respiratory distress.  Genitourinary:  No costovertebral angle tenderness or suprapubic tenderness  Musculoskeletal: Normal range of motion.  Neurological: She is alert and oriented to person, place, and time.  Skin: Skin is warm and dry.  Psychiatric: She has a normal mood and affect. Her behavior is normal. Judgment and thought content normal.          Assessment & Plan:

## 2013-11-26 NOTE — Telephone Encounter (Signed)
Already addressed

## 2013-11-26 NOTE — Addendum Note (Signed)
Addended by: Despina Hidden on: 11/26/2013 09:57 AM   Modules accepted: Orders

## 2013-11-26 NOTE — Assessment & Plan Note (Signed)
UA shows possible urinary tract infection. Send urine for culture. Start Cipro. RTC if symptoms are not improved within 4-5 days.

## 2013-11-26 NOTE — Telephone Encounter (Signed)
Pt placed on RR schedule for 930a

## 2013-11-26 NOTE — Patient Instructions (Signed)
  Start Cipro 1 tablet twice a day for 5 days.  We will notify you of the results of the urine culture once it is available and I have a chance to review it.  Drink plenty of fluids to keep urine straw-colored.  Please let us know if her symptoms persist.

## 2013-11-26 NOTE — Progress Notes (Signed)
Pre-visit discussion using our clinic review tool. No additional management support is needed unless otherwise documented below in the visit note.  

## 2013-11-27 ENCOUNTER — Ambulatory Visit: Payer: BC Managed Care – PPO | Admitting: Adult Health

## 2013-12-20 ENCOUNTER — Telehealth: Payer: Self-pay | Admitting: Internal Medicine

## 2013-12-20 MED ORDER — LISINOPRIL 5 MG PO TABS: 5.0000 mg | ORAL_TABLET | Freq: Every day | ORAL | Status: DC

## 2013-12-20 NOTE — Telephone Encounter (Signed)
Patient needs her lisinopril 5 mg called into [facility] on [address]. This is a new prescription from our office.

## 2013-12-20 NOTE — Telephone Encounter (Signed)
Refill sent.

## 2014-01-21 ENCOUNTER — Encounter: Payer: Self-pay | Admitting: Emergency Medicine

## 2014-02-14 ENCOUNTER — Ambulatory Visit: Payer: Self-pay | Admitting: Internal Medicine

## 2014-03-12 ENCOUNTER — Telehealth: Payer: Self-pay | Admitting: Internal Medicine

## 2014-03-12 MED ORDER — LISINOPRIL 5 MG PO TABS: 5.0000 mg | ORAL_TABLET | Freq: Every day | ORAL | Status: DC

## 2014-03-12 NOTE — Telephone Encounter (Signed)
Dr Derrel Nip called wanting to get refill  On lisinopril   [facility] [address]  He would like this called in today they are leaving town tomorrow

## 2014-03-12 NOTE — Telephone Encounter (Signed)
Patient notified script sent 

## 2014-03-18 ENCOUNTER — Encounter: Payer: Self-pay | Admitting: Internal Medicine

## 2014-03-28 ENCOUNTER — Ambulatory Visit: Payer: BC Managed Care – PPO | Admitting: Internal Medicine

## 2014-04-07 ENCOUNTER — Encounter: Payer: Self-pay | Admitting: Internal Medicine

## 2014-04-07 ENCOUNTER — Ambulatory Visit (INDEPENDENT_AMBULATORY_CARE_PROVIDER_SITE_OTHER): Payer: BC Managed Care – PPO | Admitting: Internal Medicine

## 2014-04-07 VITALS — BP 124/66 | HR 69 | Resp 16 | Ht 62.0 in | Wt 138.2 lb

## 2014-04-07 DIAGNOSIS — Z79899 Other long term (current) drug therapy: Secondary | ICD-10-CM

## 2014-04-07 DIAGNOSIS — I10 Essential (primary) hypertension: Secondary | ICD-10-CM

## 2014-04-07 DIAGNOSIS — E785 Hyperlipidemia, unspecified: Secondary | ICD-10-CM

## 2014-04-07 MED ORDER — DOXYCYCLINE HYCLATE 100 MG PO CAPS: 100.0000 mg | ORAL_CAPSULE | Freq: Two times a day (BID) | ORAL | Status: DC

## 2014-04-07 NOTE — Progress Notes (Signed)
Patient ID: [name], female   DOB: 15-Oct-1951, 63 y.o.   MRN: [id number]   Patient Active Problem List   Diagnosis Date Noted  . Routine general medical examination at a health care facility 09/28/2013  . Screening for malignant neoplasm of the cervix 09/28/2013  . Statin intolerance 09/28/2013  . Sinusitis, chronic 09/28/2013  . Essential hypertension, benign 06/26/2013  . Other and unspecified hyperlipidemia 06/26/2013  . Family history of clotting disorder 06/26/2013    Subjective:  CC:   Chief Complaint  Patient presents with  . Follow-up    6 month followup    HPI:   [name] is a 63 y.o. female who presents for 6 month follow up on chronic conditions including hyperlipidemia and chronic sinusitis  She has developed a light rash on the left side of her neck after working in the yard.  Using hydrocortisone cream on it. .    Wants to consider the newer alternative to statin therapy due to history of memory issues with pravastatin   Had another tick bite 4 weeks ago on left posterior thigh while at [facility].  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 Arthritis   . Chicken pox   . GERD (gastroesophageal reflux disease)   . Allergy   . Hyperlipidemia   . Hypertension   . Adenomatous polyps 2010    colon  . Pneumonia     Past Surgical History  Procedure Laterality Date  . Spine surgery         The following portions of the patient's history were reviewed and updated as appropriate: Allergies, current medications, and problem list.    Review of Systems:   Patient denies headache, fevers, malaise, unintentional weight loss, skin rash, eye pain, sinus congestion and sinus pain, sore throat, dysphagia,  hemoptysis , cough, dyspnea, wheezing, chest pain, palpitations, orthopnea, edema,  abdominal pain, nausea, melena, diarrhea, constipation, flank pain, dysuria, hematuria, urinary  Frequency, nocturia, numbness, tingling, seizures,  Focal weakness, Loss of consciousness,  Tremor, insomnia, depression, anxiety, and suicidal ideation.     History   Social History  . Marital Status: Married    Spouse Name: N/A    Number of Children: N/A  . Years of Education: N/A   Occupational History  . Not on file.   Social History Main Topics  . Smoking status: Never Smoker   . Smokeless tobacco: Never Used  . Alcohol Use: 4.2 oz/week    7 Glasses of wine per week  . Drug Use: No  . Sexual Activity: Yes   Other Topics Concern  . Not on file   Social History Narrative  . No narrative on file    Objective:  Filed Vitals:   04/07/14 1647  BP: 124/66  Pulse: 69  Resp: 16     General appearance: alert, cooperative and appears stated age Ears: normal TM's and external ear canals both ears Throat: lips, mucosa, and tongue normal; teeth and gums normal Neck: no adenopathy, no carotid bruit, supple, symmetrical, trachea midline and thyroid not enlarged, symmetric, no tenderness/mass/nodules Back: symmetric, no curvature. ROM normal. No CVA tenderness. Lungs: clear to auscultation bilaterally Heart: regular rate and rhythm, S1, S2 normal, no murmur, click, rub or gallop Abdomen: soft, non-tender; bowel sounds normal; no masses,  no  organomegaly Pulses: 2+ and symmetric Skin: faint macular rash on left anterior neck  Lymph nodes: Cervical, supraclavicular, and axillary nodes normal.  Assessment and Plan:  Essential hypertension, benign Well controlled on current regimen. Renal function stable, no changes today.  Lab Results  Component Value Date   CREATININE 0.8 04/07/2014    Lab Results  Component Value Date   NA 139 04/07/2014   K 4.0 04/07/2014   CL 107 04/07/2014   CO2 25 04/07/2014     Other and unspecified hyperlipidemia Untreated due to perceived memory  loss while taking statin .    A total of 25 minutes was spent with patient more than half of which was spent in counseling, reviewing prior records with patient and coordination of care.  Updated Medication List Outpatient Encounter Prescriptions as of 04/07/2014  Medication Sig  . lisinopril (PRINIVIL,ZESTRIL) 5 MG tablet Take 1 tablet (5 mg total) by mouth daily.  Marland Kitchen omeprazole (PRILOSEC) 20 MG capsule TAKE ONE CAPSULE BY MOUTH EVERY DAY  . doxycycline (VIBRAMYCIN) 100 MG capsule Take 1 capsule (100 mg total) by mouth 2 (two) times daily.  . [DISCONTINUED] Azelastine-Fluticasone (DYMISTA) 137-50 MCG/ACT SUSP Place 2 Squirts into the nose daily. In each nostril  . [DISCONTINUED] ciprofloxacin (CIPRO) 250 MG tablet Take 1 tablet (250 mg total) by mouth 2 (two) times daily.     Orders Placed This Encounter  Procedures  . Comp Met (CMET)    Return in about 6 months (around 10/07/2014).

## 2014-04-07 NOTE — Progress Notes (Signed)
Pre visit review using our clinic review tool, if applicable. No additional management support is needed unless otherwise documented below in the visit note. 

## 2014-04-07 NOTE — Patient Instructions (Signed)
You are doing well  No changes today  Will e mail you your labs from today  Ask Ed which rx he would prefer

## 2014-04-08 LAB — COMPREHENSIVE METABOLIC PANEL
ALT: 22 U/L (ref 0–35)
AST: 22 U/L (ref 0–37)
Albumin: 4.2 g/dL (ref 3.5–5.2)
Alkaline Phosphatase: 54 U/L (ref 39–117)
BILIRUBIN TOTAL: 0.4 mg/dL (ref 0.2–1.2)
BUN: 17 mg/dL (ref 6–23)
CO2: 25 meq/L (ref 19–32)
CREATININE: 0.8 mg/dL (ref 0.4–1.2)
Calcium: 9.5 mg/dL (ref 8.4–10.5)
Chloride: 107 mEq/L (ref 96–112)
GFR: 82.46 mL/min (ref >60.00)
Glucose, Bld: 108 mg/dL — ABNORMAL HIGH (ref 70–99)
Potassium: 4 mEq/L (ref 3.5–5.1)
Sodium: 139 mEq/L (ref 135–145)
Total Protein: 6.7 g/dL (ref 6.0–8.3)

## 2014-04-08 NOTE — Assessment & Plan Note (Signed)
Untreated due to perceived memory loss while taking statin .

## 2014-04-08 NOTE — Assessment & Plan Note (Signed)
Well controlled on current regimen. Renal function stable, no changes today.  Lab Results  Component Value Date   CREATININE 0.8 04/07/2014    Lab Results  Component Value Date   NA 139 04/07/2014   K 4.0 04/07/2014   CL 107 04/07/2014   CO2 25 04/07/2014

## 2014-04-09 ENCOUNTER — Encounter: Payer: Self-pay | Admitting: *Deleted

## 2014-06-09 ENCOUNTER — Telehealth: Payer: Self-pay | Admitting: Internal Medicine

## 2014-06-09 MED ORDER — LISINOPRIL 5 MG PO TABS: 5.0000 mg | ORAL_TABLET | Freq: Every day | ORAL | Status: DC

## 2014-06-09 MED ORDER — OMEPRAZOLE 20 MG PO CPDR: DELAYED_RELEASE_CAPSULE | ORAL | Status: DC

## 2014-06-09 NOTE — Telephone Encounter (Signed)
Scripts have been called to pharmacy as requested.

## 2014-06-09 NOTE — Telephone Encounter (Signed)
The patient is going out of town today needing prescription sent to the pharmacy and a call when it has been done.   lisinopril (PRINIVIL,ZESTRIL) 5 MG tablet  omeprazole (PRILOSEC) 20 MG capsule

## 2014-06-19 DIAGNOSIS — J018 Other acute sinusitis: Secondary | ICD-10-CM | POA: Diagnosis not present

## 2014-06-19 DIAGNOSIS — R490 Dysphonia: Secondary | ICD-10-CM | POA: Diagnosis not present

## 2014-06-19 DIAGNOSIS — K219 Gastro-esophageal reflux disease without esophagitis: Secondary | ICD-10-CM | POA: Diagnosis not present

## 2014-06-19 DIAGNOSIS — R6889 Other general symptoms and signs: Secondary | ICD-10-CM | POA: Diagnosis not present

## 2014-06-19 DIAGNOSIS — R1310 Dysphagia, unspecified: Secondary | ICD-10-CM | POA: Diagnosis not present

## 2014-07-03 ENCOUNTER — Ambulatory Visit: Payer: Self-pay | Admitting: Otolaryngology

## 2014-07-03 ENCOUNTER — Telehealth: Payer: Self-pay | Admitting: *Deleted

## 2014-07-03 DIAGNOSIS — Z803 Family history of malignant neoplasm of breast: Secondary | ICD-10-CM

## 2014-07-03 DIAGNOSIS — R1310 Dysphagia, unspecified: Secondary | ICD-10-CM | POA: Diagnosis not present

## 2014-07-03 NOTE — Telephone Encounter (Signed)
Dr Derrel Nip called states his wife's mother and sister has a history of Breast Cancer and is wanting to know if Lysha should have the BRCA 1 & 2 Genetic Testing.  Please advise

## 2014-07-04 NOTE — Telephone Encounter (Signed)
Yes, but I prefer to send her to a Comptroller for the testing and pretest counselling.  The cancer center at [facility] offers this, so unless they want it done elsewhere,  ll make a referral there

## 2014-07-04 NOTE — Telephone Encounter (Signed)
Referral is in process as requested 

## 2014-07-04 NOTE — Telephone Encounter (Signed)
Pt's husband notified, verbalized understanding. Agreeable to [facility] referral for genetics counseling. Advised [facility] would call once appointment scheduled.

## 2014-07-11 ENCOUNTER — Ambulatory Visit: Payer: Self-pay | Admitting: Internal Medicine

## 2014-07-31 DIAGNOSIS — J387 Other diseases of larynx: Secondary | ICD-10-CM | POA: Diagnosis not present

## 2014-07-31 DIAGNOSIS — H903 Sensorineural hearing loss, bilateral: Secondary | ICD-10-CM | POA: Diagnosis not present

## 2014-07-31 DIAGNOSIS — H6982 Other specified disorders of Eustachian tube, left ear: Secondary | ICD-10-CM | POA: Diagnosis not present

## 2014-09-02 ENCOUNTER — Ambulatory Visit (INDEPENDENT_AMBULATORY_CARE_PROVIDER_SITE_OTHER): Payer: Medicare Other | Admitting: *Deleted

## 2014-09-02 ENCOUNTER — Other Ambulatory Visit: Payer: Self-pay | Admitting: Internal Medicine

## 2014-09-02 DIAGNOSIS — J320 Chronic maxillary sinusitis: Secondary | ICD-10-CM

## 2014-09-02 DIAGNOSIS — Z23 Encounter for immunization: Secondary | ICD-10-CM

## 2014-09-02 MED ORDER — PHENYLEPHRINE HCL 10 MG PO TABS
10.0000 mg | ORAL_TABLET | ORAL | Status: DC | PRN
Start: 2014-09-02 — End: 2015-04-01

## 2014-09-02 MED ORDER — AZELASTINE-FLUTICASONE 137-50 MCG/ACT NA SUSP: 2.0000 | Freq: Every day | NASAL | Status: DC

## 2014-09-10 DIAGNOSIS — N898 Other specified noninflammatory disorders of vagina: Secondary | ICD-10-CM | POA: Diagnosis not present

## 2014-09-10 DIAGNOSIS — N950 Postmenopausal bleeding: Secondary | ICD-10-CM | POA: Diagnosis not present

## 2014-09-10 DIAGNOSIS — R350 Frequency of micturition: Secondary | ICD-10-CM | POA: Diagnosis not present

## 2014-09-29 ENCOUNTER — Encounter: Payer: BC Managed Care – PPO | Admitting: Internal Medicine

## 2014-10-01 ENCOUNTER — Ambulatory Visit (INDEPENDENT_AMBULATORY_CARE_PROVIDER_SITE_OTHER): Payer: Medicare Other | Admitting: Internal Medicine

## 2014-10-01 ENCOUNTER — Encounter: Payer: Self-pay | Admitting: Internal Medicine

## 2014-10-01 VITALS — BP 126/78 | HR 71 | Temp 98.6°F | Resp 14 | Ht 61.5 in | Wt 135.5 lb

## 2014-10-01 DIAGNOSIS — E559 Vitamin D deficiency, unspecified: Secondary | ICD-10-CM

## 2014-10-01 DIAGNOSIS — R5383 Other fatigue: Secondary | ICD-10-CM

## 2014-10-01 DIAGNOSIS — Z0000 Encounter for general adult medical examination without abnormal findings: Secondary | ICD-10-CM | POA: Diagnosis not present

## 2014-10-01 DIAGNOSIS — E785 Hyperlipidemia, unspecified: Secondary | ICD-10-CM

## 2014-10-01 DIAGNOSIS — Z803 Family history of malignant neoplasm of breast: Secondary | ICD-10-CM

## 2014-10-01 MED ORDER — CIPROFLOXACIN HCL 250 MG PO TABS: 250.0000 mg | ORAL_TABLET | Freq: Two times a day (BID) | ORAL | Status: DC

## 2014-10-01 MED ORDER — OMEPRAZOLE 40 MG PO CPDR: 40.0000 mg | DELAYED_RELEASE_CAPSULE | Freq: Every day | ORAL | Status: DC

## 2014-10-01 MED ORDER — LEVOFLOXACIN 500 MG PO TABS: 500.0000 mg | ORAL_TABLET | Freq: Every day | ORAL | Status: DC

## 2014-10-01 NOTE — Progress Notes (Signed)
Patient ID: [name], female   DOB: 04-28-1951, 63 y.o.   MRN: [id number]  The patient is here for annual Medicare wellness examination and management of other chronic and acute problems.   The risk factors are reflected in the social history.  The roster of all physicians providing medical care to patient - is listed in the Snapshot section of the chart.  Activities of daily living:  The patient is 100% independent in all ADLs: dressing, toileting, feeding as well as independent mobility  Home safety : The patient has smoke detectors in the home. They wear seatbelts.  There are no firearms at home. There is no violence in the home.   There is no risks for hepatitis, STDs or HIV. There is no   history of blood transfusion. They have no travel history to infectious disease endemic areas of the world.  The patient has seen their dentist in the last six month. They have seen their eye doctor in the last year. They admit to slight hearing difficulty with regard to whispered voices and some television programs.  They have deferred audiologic testing in the last year.  They do not  have excessive sun exposure. Discussed the need for sun protection: hats, long sleeves and use of sunscreen if there is significant sun exposure.   Diet: the importance of a healthy diet is discussed. They do have a healthy diet.  The benefits of regular aerobic exercise were discussed. She walks 4 times per week ,  20 minutes.   Depression screen: there are no signs or vegative symptoms of depression- irritability, change in appetite, anhedonia, sadness/tearfullness.  Cognitive assessment: the patient manages all their financial and personal affairs and is actively engaged. They could relate day,date,year and events; recalled 2/3 objects at 3 minutes; performed clock-face test normally.  The following portions of the patient's history were reviewed and updated as appropriate: allergies, current medications, past  family history, past medical history,  past surgical history, past social history  and problem list.  Visual acuity was not assessed per patient preference since she has regular follow up with her ophthalmologist. Hearing and body mass index were assessed and reviewed.   During the course of the visit the patient was educated and counseled about appropriate screening and preventive services including : fall prevention , diabetes screening, nutrition counseling, colorectal cancer screening, and recommended immunizations.    Objective:  BP 126/78 mmHg  Pulse 71  Temp(Src) 98.6 F (37 C) (Oral)  Resp 14  Ht 5' 1.5" (1.562 m)  Wt 135 lb 8 oz (61.462 kg)  BMI 25.19 kg/m2  SpO2 98% General appearance: alert, cooperative and appears stated age Head: Normocephalic, without obvious abnormality, atraumatic Eyes: conjunctivae/corneas clear. PERRL, EOM's intact. Fundi benign. Ears: normal TM's and external ear canals both ears Nose: Nares normal. Septum midline. Mucosa normal. No drainage or sinus tenderness. Throat: lips, mucosa, and tongue normal; teeth and gums normal Neck: no adenopathy, no carotid bruit, no JVD, supple, symmetrical, trachea midline and thyroid not enlarged, symmetric, no tenderness/mass/nodules Lungs: clear to auscultation bilaterally Breasts: normal appearance, no masses or tenderness Heart: regular rate and rhythm, S1, S2 normal, no murmur, click, rub or gallop Abdomen: soft, non-tender; bowel sounds normal; no masses,  no organomegaly Extremities: extremities normal, atraumatic, no cyanosis or edema Pulses: 2+ and symmetric Skin: Skin color, texture, turgor normal. No rashes or lesions Neurologic: Alert and oriented X 3, normal strength and tone. Normal symmetric reflexes. Normal coordination and gait.  Assesment and plan  Medicare welcome visit Annual wellness  exam was done as well as a comprehensive physical exam and management of acute and chronic conditions .   During the course of the visit the patient was educated and counseled about appropriate screening and preventive services including :  diabetes screening, lipid analysis with projected  10 year  risk for CAD , nutrition counseling, colorectal cancer screening, and recommended immunizations.  Printed recommendations for health maintenance screenings was given.    Updated Medication List Outpatient Encounter Prescriptions as of 10/01/2014  Medication Sig  . lisinopril (PRINIVIL,ZESTRIL) 5 MG tablet Take 1 tablet (5 mg total) by mouth daily.  Marland Kitchen omeprazole (PRILOSEC) 40 MG capsule Take 1 capsule (40 mg total) by mouth daily.  . phenylephrine (SUDAFED PE) 10 MG TABS tablet Take 1 tablet (10 mg total) by mouth every 4 (four) hours as needed.  . [DISCONTINUED] omeprazole (PRILOSEC) 20 MG capsule TAKE ONE CAPSULE BY MOUTH EVERY DAY (Patient taking differently: 40 mg. TAKE ONE CAPSULE BY MOUTH EVERY DAY)  . [DISCONTINUED] omeprazole (PRILOSEC) 40 MG capsule Take 40 mg by mouth daily.  . Azelastine-Fluticasone (DYMISTA) 137-50 MCG/ACT SUSP Place 2 Squirts into the nose daily. In each nostril (Patient not taking: Reported on 10/01/2014)  . ciprofloxacin (CIPRO) 250 MG tablet Take 1 tablet (250 mg total) by mouth 2 (two) times daily.  Marland Kitchen doxycycline (VIBRAMYCIN) 100 MG capsule Take 1 capsule (100 mg total) by mouth 2 (two) times daily. (Patient not taking: Reported on 10/01/2014)  . levofloxacin (LEVAQUIN) 500 MG tablet Take 1 tablet (500 mg total) by mouth daily.  . [DISCONTINUED] ciprofloxacin (CIPRO) 250 MG tablet Take 1 tablet (250 mg total) by mouth 2 (two) times daily.

## 2014-10-01 NOTE — Assessment & Plan Note (Signed)

## 2014-10-01 NOTE — Patient Instructions (Signed)
You had your annual Medicare wellness exam today  We will schedule your mammogram next April.  Please make an appt for fasting abs at your leisure  We will contact you with the bloodwork results  Health Maintenance Adopting a healthy lifestyle and getting preventive care can go a long way to promote health and wellness. Talk with your health care provider about what schedule of regular examinations is right for you. This is a good chance for you to check in with your provider about disease prevention and staying healthy. In between checkups, there are plenty of things you can do on your own. Experts have done a lot of research about which lifestyle changes and preventive measures are most likely to keep you healthy. Ask your health care provider for more information. WEIGHT AND DIET  Eat a healthy diet  Be sure to include plenty of vegetables, fruits, low-fat dairy products, and lean protein.  Do not eat a lot of foods high in solid fats, added sugars, or salt.  Get regular exercise. This is one of the most important things you can do for your health.  Most adults should exercise for at least 150 minutes each week. The exercise should increase your heart rate and make you sweat (moderate-intensity exercise).  Most adults should also do strengthening exercises at least twice a week. This is in addition to the moderate-intensity exercise.  Maintain a healthy weight  Body mass index (BMI) is a measurement that can be used to identify possible weight problems. It estimates body fat based on height and weight. Your health care provider can help determine your BMI and help you achieve or maintain a healthy weight.  For females 68 years of age and older:   A BMI below 18.5 is considered underweight.  A BMI of 18.5 to 24.9 is normal.  A BMI of 25 to 29.9 is considered overweight.  A BMI of 30 and above is considered obese.  Watch levels of cholesterol and blood lipids  You should  start having your blood tested for lipids and cholesterol at 65 years of age, then have this test every 5 years.  You may need to have your cholesterol levels checked more often if:  Your lipid or cholesterol levels are high.  You are older than 65 years of age.  You are at high risk for heart disease.  CANCER SCREENING   Lung Cancer  Lung cancer screening is recommended for adults 15-26 years old who are at high risk for lung cancer because of a history of smoking.  A yearly low-dose CT scan of the lungs is recommended for people who:  Currently smoke.  Have quit within the past 15 years.  Have at least a 30-pack-year history of smoking. A pack year is smoking an average of one pack of cigarettes a day for 1 year.  Yearly screening should continue until it has been 15 years since you quit.  Yearly screening should stop if you develop a health problem that would prevent you from having lung cancer treatment.  Breast Cancer  Practice breast self-awareness. This means understanding how your breasts normally appear and feel.  It also means doing regular breast self-exams. Let your health care provider know about any changes, no matter how small.  If you are in your 20s or 30s, you should have a clinical breast exam (CBE) by a health care provider every 1-3 years as part of a regular health exam.  If you are 40 or older,  have a CBE every year. Also consider having a breast X-ray (mammogram) every year.  If you have a family history of breast cancer, talk to your health care provider about genetic screening.  If you are at high risk for breast cancer, talk to your health care provider about having an MRI and a mammogram every year.  Breast cancer gene (BRCA) assessment is recommended for women who have family members with BRCA-related cancers. BRCA-related cancers include:  Breast.  Ovarian.  Tubal.  Peritoneal cancers.  Results of the assessment will determine the  need for genetic counseling and BRCA1 and BRCA2 testing. Cervical Cancer Routine pelvic examinations to screen for cervical cancer are no longer recommended for nonpregnant women who are considered low risk for cancer of the pelvic organs (ovaries, uterus, and vagina) and who do not have symptoms. A pelvic examination may be necessary if you have symptoms including those associated with pelvic infections. Ask your health care provider if a screening pelvic exam is right for you.   The Pap test is the screening test for cervical cancer for women who are considered at risk.  If you had a hysterectomy for a problem that was not cancer or a condition that could lead to cancer, then you no longer need Pap tests.  If you are older than 65 years, and you have had normal Pap tests for the past 10 years, you no longer need to have Pap tests.  If you have had past treatment for cervical cancer or a condition that could lead to cancer, you need Pap tests and screening for cancer for at least 20 years after your treatment.  If you no longer get a Pap test, assess your risk factors if they change (such as having a new sexual partner). This can affect whether you should start being screened again.  Some women have medical problems that increase their chance of getting cervical cancer. If this is the case for you, your health care provider may recommend more frequent screening and Pap tests.  The human papillomavirus (HPV) test is another test that may be used for cervical cancer screening. The HPV test looks for the virus that can cause cell changes in the cervix. The cells collected during the Pap test can be tested for HPV.  The HPV test can be used to screen women 26 years of age and older. Getting tested for HPV can extend the interval between normal Pap tests from three to five years.  An HPV test also should be used to screen women of any age who have unclear Pap test results.  After 65 years of age,  women should have HPV testing as often as Pap tests.  Colorectal Cancer  This type of cancer can be detected and often prevented.  Routine colorectal cancer screening usually begins at 65 years of age and continues through 65 years of age.  Your health care provider may recommend screening at an earlier age if you have risk factors for colon cancer.  Your health care provider may also recommend using home test kits to check for hidden blood in the stool.  A small camera at the end of a tube can be used to examine your colon directly (sigmoidoscopy or colonoscopy). This is done to check for the earliest forms of colorectal cancer.  Routine screening usually begins at age 87.  Direct examination of the colon should be repeated every 5-10 years through 65 years of age. However, you may need to be screened  more often if early forms of precancerous polyps or small growths are found. Skin Cancer  Check your skin from head to toe regularly.  Tell your health care provider about any new moles or changes in moles, especially if there is a change in a mole's shape or color.  Also tell your health care provider if you have a mole that is larger than the size of a pencil eraser.  Always use sunscreen. Apply sunscreen liberally and repeatedly throughout the day.  Protect yourself by wearing long sleeves, pants, a wide-brimmed hat, and sunglasses whenever you are outside. HEART DISEASE, DIABETES, AND HIGH BLOOD PRESSURE   Have your blood pressure checked at least every 1-2 years. High blood pressure causes heart disease and increases the risk of stroke.  If you are between 73 years and 47 years old, ask your health care provider if you should take aspirin to prevent strokes.  Have regular diabetes screenings. This involves taking a blood sample to check your fasting blood sugar level.  If you are at a normal weight and have a low risk for diabetes, have this test once every three years after 65  years of age.  If you are overweight and have a high risk for diabetes, consider being tested at a younger age or more often. PREVENTING INFECTION  Hepatitis B  If you have a higher risk for hepatitis B, you should be screened for this virus. You are considered at high risk for hepatitis B if:  You were born in a country where hepatitis B is common. Ask your health care provider which countries are considered high risk.  Your parents were born in a high-risk country, and you have not been immunized against hepatitis B (hepatitis B vaccine).  You have HIV or AIDS.  You use needles to inject street drugs.  You live with someone who has hepatitis B.  You have had sex with someone who has hepatitis B.  You get hemodialysis treatment.  You take certain medicines for conditions, including cancer, organ transplantation, and autoimmune conditions. Hepatitis C  Blood testing is recommended for:  Everyone born from [birth year] through 1967.  Anyone with known risk factors for hepatitis C. Sexually transmitted infections (STIs)  You should be screened for sexually transmitted infections (STIs) including gonorrhea and chlamydia if:  You are sexually active and are younger than 65 years of age.  You are older than 65 years of age and your health care provider tells you that you are at risk for this type of infection.  Your sexual activity has changed since you were last screened and you are at an increased risk for chlamydia or gonorrhea. Ask your health care provider if you are at risk.  If you do not have HIV, but are at risk, it may be recommended that you take a prescription medicine daily to prevent HIV infection. This is called pre-exposure prophylaxis (PrEP). You are considered at risk if:  You are sexually active and do not regularly use condoms or know the HIV status of your partner(s).  You take drugs by injection.  You are sexually active with a partner who has HIV. Talk with  your health care provider about whether you are at high risk of being infected with HIV. If you choose to begin PrEP, you should first be tested for HIV. You should then be tested every 3 months for as long as you are taking PrEP.  PREGNANCY   If you are premenopausal and you may  become pregnant, ask your health care provider about preconception counseling.  If you may become pregnant, take 400 to 800 micrograms (mcg) of folic acid every day.  If you want to prevent pregnancy, talk to your health care provider about birth control (contraception). OSTEOPOROSIS AND MENOPAUSE   Osteoporosis is a disease in which the bones lose minerals and strength with aging. This can result in serious bone fractures. Your risk for osteoporosis can be identified using a bone density scan.  If you are 16 years of age or older, or if you are at risk for osteoporosis and fractures, ask your health care provider if you should be screened.  Ask your health care provider whether you should take a calcium or vitamin D supplement to lower your risk for osteoporosis.  Menopause may have certain physical symptoms and risks.  Hormone replacement therapy may reduce some of these symptoms and risks. Talk to your health care provider about whether hormone replacement therapy is right for you.  HOME CARE INSTRUCTIONS   Schedule regular health, dental, and eye exams.  Stay current with your immunizations.   Do not use any tobacco products including cigarettes, chewing tobacco, or electronic cigarettes.  If you are pregnant, do not drink alcohol.  If you are breastfeeding, limit how much and how often you drink alcohol.  Limit alcohol intake to no more than 1 drink per day for nonpregnant women. One drink equals 12 ounces of beer, 5 ounces of wine, or 1 ounces of hard liquor.  Do not use street drugs.  Do not share needles.  Ask your health care provider for help if you need support or information about quitting  drugs.  Tell your health care provider if you often feel depressed.  Tell your health care provider if you have ever been abused or do not feel safe at home. Document Released: 04/25/2011 Document Revised: 02/24/2014 Document Reviewed: 09/11/2013 [facility] Patient Information 2015 [employer] [place], [place]. This information is not intended to replace advice given to you by your health care provider. Make sure you discuss any questions you have with your health care provider.

## 2014-10-08 ENCOUNTER — Other Ambulatory Visit (INDEPENDENT_AMBULATORY_CARE_PROVIDER_SITE_OTHER): Payer: Medicare Other

## 2014-10-08 DIAGNOSIS — R5383 Other fatigue: Secondary | ICD-10-CM | POA: Diagnosis not present

## 2014-10-08 DIAGNOSIS — E559 Vitamin D deficiency, unspecified: Secondary | ICD-10-CM

## 2014-10-08 DIAGNOSIS — E785 Hyperlipidemia, unspecified: Secondary | ICD-10-CM | POA: Diagnosis not present

## 2014-10-08 LAB — LIPID PANEL
Cholesterol: 223 mg/dL — ABNORMAL HIGH (ref 0–200)
HDL: 51.8 mg/dL (ref >39.00)
LDL CALC: 159 mg/dL — AB (ref 0–99)
NonHDL: 171.2
Total CHOL/HDL Ratio: 4
Triglycerides: 62 mg/dL (ref 0.0–149.0)
VLDL: 12.4 mg/dL (ref 0.0–40.0)

## 2014-10-08 LAB — COMPREHENSIVE METABOLIC PANEL
ALK PHOS: 52 U/L (ref 39–117)
ALT: 29 U/L (ref 0–35)
AST: 30 U/L (ref 0–37)
Albumin: 4.1 g/dL (ref 3.5–5.2)
BILIRUBIN TOTAL: 0.7 mg/dL (ref 0.2–1.2)
BUN: 17 mg/dL (ref 6–23)
CO2: 25 mEq/L (ref 19–32)
Calcium: 9.3 mg/dL (ref 8.4–10.5)
Chloride: 109 mEq/L (ref 96–112)
Creatinine, Ser: 0.8 mg/dL (ref 0.4–1.2)
GFR: 79.87 mL/min (ref >60.00)
Glucose, Bld: 92 mg/dL (ref 70–99)
Potassium: 4.3 mEq/L (ref 3.5–5.1)
SODIUM: 141 meq/L (ref 135–145)
TOTAL PROTEIN: 7 g/dL (ref 6.0–8.3)

## 2014-10-08 LAB — CBC WITH DIFFERENTIAL/PLATELET
Basophils Absolute: 0 10*3/uL (ref 0.0–0.1)
Basophils Relative: 0.4 % (ref 0.0–3.0)
EOS ABS: 0.1 10*3/uL (ref 0.0–0.7)
Eosinophils Relative: 3 % (ref 0.0–5.0)
HEMATOCRIT: 42.2 % (ref 36.0–46.0)
Hemoglobin: 14 g/dL (ref 12.0–15.0)
LYMPHS ABS: 1.2 10*3/uL (ref 0.7–4.0)
Lymphocytes Relative: 32.6 % (ref 12.0–46.0)
MCHC: 33.2 g/dL (ref 30.0–36.0)
MCV: 91.4 fl (ref 78.0–100.0)
MONO ABS: 0.4 10*3/uL (ref 0.1–1.0)
Monocytes Relative: 11.1 % (ref 3.0–12.0)
NEUTROS PCT: 52.9 % (ref 43.0–77.0)
Neutro Abs: 1.9 10*3/uL (ref 1.4–7.7)
PLATELETS: 168 10*3/uL (ref 150.0–400.0)
RBC: 4.62 Mil/uL (ref 3.87–5.11)
RDW: 13.1 % (ref 11.5–15.5)
WBC: 3.5 10*3/uL — ABNORMAL LOW (ref 4.0–10.5)

## 2014-10-08 LAB — TSH: TSH: 4.55 u[IU]/mL — ABNORMAL HIGH (ref 0.35–4.50)

## 2014-10-08 LAB — VITAMIN D 25 HYDROXY (VIT D DEFICIENCY, FRACTURES): VITD: 21.93 ng/mL — ABNORMAL LOW (ref 30.00–100.00)

## 2014-10-09 ENCOUNTER — Telehealth: Payer: Self-pay | Admitting: Internal Medicine

## 2014-10-12 ENCOUNTER — Other Ambulatory Visit: Payer: Self-pay | Admitting: Internal Medicine

## 2014-10-12 ENCOUNTER — Encounter: Payer: Self-pay | Admitting: Internal Medicine

## 2014-10-12 DIAGNOSIS — E785 Hyperlipidemia, unspecified: Secondary | ICD-10-CM | POA: Insufficient documentation

## 2014-10-12 MED ORDER — ERGOCALCIFEROL 1.25 MG (50000 UT) PO CAPS: 50000.0000 [IU] | ORAL_CAPSULE | ORAL | Status: DC

## 2014-12-01 ENCOUNTER — Other Ambulatory Visit: Payer: Self-pay | Admitting: Internal Medicine

## 2015-01-21 ENCOUNTER — Telehealth: Payer: Self-pay | Admitting: Internal Medicine

## 2015-01-21 NOTE — Telephone Encounter (Signed)
error 

## 2015-02-16 ENCOUNTER — Ambulatory Visit
Admit: 2015-02-16 | Disposition: A | Discharge status: Home / Self Care | Payer: Self-pay | Attending: Internal Medicine | Admitting: Internal Medicine

## 2015-02-16 DIAGNOSIS — Z1231 Encounter for screening mammogram for malignant neoplasm of breast: Secondary | ICD-10-CM | POA: Diagnosis not present

## 2015-02-23 ENCOUNTER — Other Ambulatory Visit: Payer: Self-pay | Admitting: Internal Medicine

## 2015-03-30 ENCOUNTER — Ambulatory Visit: Payer: Medicare Other | Admitting: Internal Medicine

## 2015-04-01 ENCOUNTER — Ambulatory Visit (INDEPENDENT_AMBULATORY_CARE_PROVIDER_SITE_OTHER): Payer: Medicare Other | Admitting: Internal Medicine

## 2015-04-01 ENCOUNTER — Encounter: Payer: Self-pay | Admitting: Internal Medicine

## 2015-04-01 VITALS — BP 118/64 | HR 66 | Temp 97.7°F | Resp 14 | Ht 62.0 in | Wt 133.0 lb

## 2015-04-01 DIAGNOSIS — L989 Disorder of the skin and subcutaneous tissue, unspecified: Secondary | ICD-10-CM

## 2015-04-01 DIAGNOSIS — Z803 Family history of malignant neoplasm of breast: Secondary | ICD-10-CM | POA: Diagnosis not present

## 2015-04-01 DIAGNOSIS — E785 Hyperlipidemia, unspecified: Secondary | ICD-10-CM

## 2015-04-01 DIAGNOSIS — I10 Essential (primary) hypertension: Secondary | ICD-10-CM

## 2015-04-01 LAB — COMPREHENSIVE METABOLIC PANEL
ALT: 18 U/L (ref 0–35)
AST: 20 U/L (ref 0–37)
Albumin: 4.5 g/dL (ref 3.5–5.2)
Alkaline Phosphatase: 62 U/L (ref 39–117)
BUN: 17 mg/dL (ref 6–23)
CO2: 31 mEq/L (ref 19–32)
Calcium: 9.7 mg/dL (ref 8.4–10.5)
Chloride: 103 mEq/L (ref 96–112)
Creatinine, Ser: 0.71 mg/dL (ref 0.40–1.20)
GFR: 87.58 mL/min (ref >60.00)
Glucose, Bld: 85 mg/dL (ref 70–99)
Potassium: 4.4 mEq/L (ref 3.5–5.1)
Sodium: 139 mEq/L (ref 135–145)
Total Bilirubin: 0.5 mg/dL (ref 0.2–1.2)
Total Protein: 6.9 g/dL (ref 6.0–8.3)

## 2015-04-01 MED ORDER — FAMOTIDINE 20 MG PO TABS: 20.0000 mg | ORAL_TABLET | Freq: Two times a day (BID) | ORAL | Status: DC

## 2015-04-01 MED ORDER — MUPIROCIN 2 % EX OINT: 1.0000 "application " | TOPICAL_OINTMENT | Freq: Two times a day (BID) | CUTANEOUS | Status: DC

## 2015-04-01 NOTE — Progress Notes (Signed)
Subjective:  Patient ID: [name], female    DOB: 07/20/1951  Age: 64 y.o. MRN: [id number]  CC: The primary encounter diagnosis was Skin lesion of cheek. Diagnoses of Essential hypertension, Family history of breast cancer, Hyperlipidemia, and Essential hypertension, benign were also pertinent to this visit.  HPI [name] presents for 6 month follow up on chronic conditions including hypertension and hyperlipidemia. She feels generally well except for the following minor issues:    sinus congestion only at night while lying on that side,  Has not improved with nasonex trial   Vit d def:  Took 3 months of Vit d 50K weekly but has not resumed a daily supplement yet. 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  denies swelling and redness of joints.  Mostly hands and feet .  improve with use of OTC NSAIDs .  No systemic symptoms .  Outpatient Prescriptions Prior to Visit  Medication Sig Dispense Refill  . Azelastine-Fluticasone (DYMISTA) 137-50 MCG/ACT SUSP Place 2 Squirts into the nose daily. In each nostril 23 g 11  . lisinopril (PRINIVIL,ZESTRIL) 5 MG tablet TAKE ONE TABLET BY MOUTH ONCE DAILY 90 tablet 1  . omeprazole (PRILOSEC) 40 MG capsule Take 1 capsule (40 mg total) by mouth daily. 90 capsule 2  . ciprofloxacin (CIPRO) 250 MG tablet Take 1 tablet (250 mg total) by mouth 2 (two) times daily. 10 tablet 0  . doxycycline (VIBRAMYCIN) 100 MG capsule Take 1 capsule (100 mg total) by mouth 2 (two) times daily. (Patient not taking: Reported on 10/01/2014) 20 capsule 0  . ergocalciferol (DRISDOL) 50000 UNITS capsule Take 1 capsule (50,000 Units total) by mouth once a week. (Patient not taking: Reported on 04/01/2015) 12 capsule 0  . levofloxacin (LEVAQUIN) 500 MG tablet Take 1 tablet (500 mg total) by mouth  daily. 7 tablet 0  . phenylephrine (SUDAFED PE) 10 MG TABS tablet Take 1 tablet (10 mg total) by mouth every 4 (four) hours as needed. 60 tablet 1   No facility-administered medications prior to visit.    Review of Systems;  Patient denies headache, fevers, malaise, unintentional weight loss, skin rash, eye pain,and sinus pain, sore throat, dysphagia,  hemoptysis , cough, dyspnea, wheezing, chest pain, palpitations, orthopnea, edema, abdominal pain, nausea, melena, diarrhea, constipation, flank pain, dysuria, hematuria, urinary  Frequency, nocturia, numbness, tingling, seizures,  Focal weakness, Loss of consciousness,  Tremor, insomnia, depression, anxiety, and suicidal ideation.      Objective:  BP 118/64 mmHg  Pulse 66  Temp(Src) 97.7 F (36.5 C) (Oral)  Resp 14  Ht '5\' 2"'  (1.575 m)  Wt 133 lb (60.328 kg)  BMI 24.32 kg/m2  SpO2 97%  BP Readings from Last 3 Encounters:  04/01/15 118/64  10/01/14 126/78  04/07/14 124/66    Wt Readings from Last 3 Encounters:  04/01/15 133 lb (60.328 kg)  10/01/14 135 lb 8 oz (61.462 kg)  04/07/14 138 lb 4 oz (62.71 kg)    General appearance: alert, cooperative and appears stated age Ears: normal TM's and external ear canals both ears Throat: lips, mucosa, and tongue normal; teeth and gums normal Neck: no adenopathy, no carotid bruit, supple, symmetrical, trachea midline and thyroid not enlarged, symmetric, no tenderness/mass/nodules Back: symmetric, no curvature. ROM normal. No CVA tenderness. Lungs: clear to  auscultation bilaterally Heart: regular rate and rhythm, S1, S2 normal, no murmur, click, rub or gallop Abdomen: soft, non-tender; bowel sounds normal; no masses,  no organomegaly Pulses: 2+ and symmetric Skin: Skin color, texture, turgor normal. No rashes or lesions Lymph nodes: Cervical, supraclavicular, and axillary nodes normal.  No results found for: HGBA1C  Lab Results  Component Value Date   CREATININE 0.71 04/01/2015    CREATININE 0.8 10/08/2014   CREATININE 0.8 04/07/2014    Lab Results  Component Value Date   WBC 3.5* 10/08/2014   HGB 14.0 10/08/2014   HCT 42.2 10/08/2014   PLT 168.0 10/08/2014   GLUCOSE 85 04/01/2015   CHOL 223* 10/08/2014   TRIG 62.0 10/08/2014   HDL 51.80 10/08/2014   LDLDIRECT 181.4 09/26/2013   LDLCALC 159* 10/08/2014   ALT 18 04/01/2015   AST 20 04/01/2015   NA 139 04/01/2015   K 4.4 04/01/2015   CL 103 04/01/2015   CREATININE 0.71 04/01/2015   BUN 17 04/01/2015   CO2 31 04/01/2015   TSH 4.55* 10/08/2014    Mm Screening Breast Tomo Bilateral  02/16/2015   CLINICAL DATA:  Screening.  EXAM: DIGITAL SCREENING BILATERAL MAMMOGRAM WITH 3D TOMO WITH CAD  COMPARISON:  Previous exam(s).  ACR Breast Density Category b: There are scattered areas of fibroglandular density.  FINDINGS: There are no findings suspicious for malignancy. Images were processed with CAD.  IMPRESSION: No mammographic evidence of malignancy. A result letter of this screening mammogram will be mailed directly to the patient.  RECOMMENDATION: Screening mammogram in one year. (Code:[id number]-01Y)  BI-RADS CATEGORY  1: Negative.   Electronically Signed   By: Altamese Cabal M.D.   On: 02/16/2015 11:21    Assessment & Plan:   Problem List Items Addressed This Visit    Essential hypertension, benign    Well controlled on current regimen. Renal function stable, no changes today.  Lab Results  Component Value Date   CREATININE 0.71 04/01/2015   Lab Results  Component Value Date   NA 139 04/01/2015   K 4.4 04/01/2015   CL 103 04/01/2015   CO2 31 04/01/2015         Family history of breast cancer    BRCA testing was negative in October. Mammogram normal April       Hyperlipidemia    10 yr risk risk of CAD is 9% using Framingham risk calculator. Statin intolerance noted. No meds indicated.  Lab Results  Component Value Date   CHOL 223* 10/08/2014   HDL 51.80 10/08/2014   LDLCALC 159* 10/08/2014    LDLDIRECT 181.4 09/26/2013   TRIG 62.0 10/08/2014   CHOLHDL 4 10/08/2014         Skin lesion of cheek - Primary    Referral to Dr Kellie Moor for definitive treatment      Relevant Orders   Ambulatory referral to Dermatology    Other Visit Diagnoses    Essential hypertension        Relevant Orders    Comp Met (CMET) (Completed)      A total of 25 minutes of face to face time was spent with patient more than half of which was spent in counselling and coordination of care   I have discontinued Ms. Ballantine's doxycycline, phenylephrine, omeprazole, levofloxacin, ciprofloxacin, and ergocalciferol. I am also having her start on mupirocin ointment and famotidine. Additionally, I am having her maintain her Azelastine-Fluticasone, lisinopril, Calcium Carbonate (CALTRATE 600 PO), and multivitamin with minerals.  Meds ordered this  encounter  Medications  . Calcium Carbonate (CALTRATE 600 PO)    Sig: Take 1 tablet by mouth daily.  . Multiple Vitamins-Minerals (MULTIVITAMIN WITH MINERALS) tablet    Sig: Take 1 tablet by mouth daily.  . mupirocin ointment (BACTROBAN) 2 %    Sig: Place 1 application into the nose 2 (two) times daily. As needed    Dispense:  22 g    Refill:  5  . famotidine (PEPCID) 20 MG tablet    Sig: Take 1 tablet (20 mg total) by mouth 2 (two) times daily.    Dispense:  180 tablet    Refill:  1    Medications Discontinued During This Encounter  Medication Reason  . ciprofloxacin (CIPRO) 250 MG tablet Completed Course  . doxycycline (VIBRAMYCIN) 100 MG capsule Completed Course  . levofloxacin (LEVAQUIN) 500 MG tablet Completed Course  . phenylephrine (SUDAFED PE) 10 MG TABS tablet Completed Course  . ciprofloxacin (CIPRO) 250 MG tablet Completed Course  . ergocalciferol (DRISDOL) 50000 UNITS capsule   . phenylephrine (SUDAFED PE) 10 MG TABS tablet Completed Course  . levofloxacin (LEVAQUIN) 500 MG tablet Completed Course  . omeprazole (PRILOSEC) 40 MG capsule      Follow-up: Return in about 6 months (around 10/01/2015).   [name], MD

## 2015-04-01 NOTE — Patient Instructions (Addendum)
I recommend getting the majority of your calcium and Vitamin D  through diet rather than supplements given the recent association of calcium supplements with increased coronary artery calcium scores  Soy milk, nuts,  Dark green leafy veggies ,  Tofu and beans are goo non dairy sources (see list)   Try the almond and cashew milks that most grocery stores  now carry  in the dairy  Section>   They are lactose free:  Silk brand Almond Light,  Original formula.  Delicious,  Low carb,  Low cal,  Cholesterol free   Trial of famotidine once or twice daily instead of omeprazole

## 2015-04-01 NOTE — Progress Notes (Signed)
Pre-visit discussion using our clinic review tool. No additional management support is needed unless otherwise documented below in the visit note.  

## 2015-04-02 ENCOUNTER — Encounter: Payer: Self-pay | Admitting: *Deleted

## 2015-04-03 NOTE — Assessment & Plan Note (Deleted)
Lab Results  Component Value Date   CHOL 223* 10/08/2014   HDL 51.80 10/08/2014   LDLCALC 159* 10/08/2014   LDLDIRECT 181.4 09/26/2013   TRIG 62.0 10/08/2014   CHOLHDL 4 10/08/2014

## 2015-04-03 NOTE — Assessment & Plan Note (Signed)
Well controlled on current regimen. Renal function stable, no changes today.  Lab Results  Component Value Date   CREATININE 0.71 04/01/2015   Lab Results  Component Value Date   NA 139 04/01/2015   K 4.4 04/01/2015   CL 103 04/01/2015   CO2 31 04/01/2015

## 2015-04-03 NOTE — Assessment & Plan Note (Signed)
10 yr risk risk of CAD is 9% using Framingham risk calculator. Statin intolerance noted. No meds indicated.  Lab Results  Component Value Date   CHOL 223* 10/08/2014   HDL 51.80 10/08/2014   LDLCALC 159* 10/08/2014   LDLDIRECT 181.4 09/26/2013   TRIG 62.0 10/08/2014   CHOLHDL 4 10/08/2014

## 2015-04-03 NOTE — Assessment & Plan Note (Signed)
Referral to Dr Kellie Moor for definitive treatment

## 2015-04-03 NOTE — Assessment & Plan Note (Signed)
Recommended trial fo sinus rinse with [facility]' at night

## 2015-04-03 NOTE — Assessment & Plan Note (Signed)
BRCA testing was negative in October. Mammogram normal April

## 2015-04-06 ENCOUNTER — Ambulatory Visit: Payer: Medicare Other | Admitting: Internal Medicine

## 2015-05-05 DIAGNOSIS — L570 Actinic keratosis: Secondary | ICD-10-CM | POA: Diagnosis not present

## 2015-05-05 DIAGNOSIS — L573 Poikiloderma of Civatte: Secondary | ICD-10-CM | POA: Diagnosis not present

## 2015-05-05 DIAGNOSIS — X32XXXA Exposure to sunlight, initial encounter: Secondary | ICD-10-CM | POA: Diagnosis not present

## 2015-08-04 DIAGNOSIS — X32XXXA Exposure to sunlight, initial encounter: Secondary | ICD-10-CM | POA: Diagnosis not present

## 2015-08-04 DIAGNOSIS — L573 Poikiloderma of Civatte: Secondary | ICD-10-CM | POA: Diagnosis not present

## 2015-08-04 DIAGNOSIS — L814 Other melanin hyperpigmentation: Secondary | ICD-10-CM | POA: Diagnosis not present

## 2015-08-10 ENCOUNTER — Ambulatory Visit (INDEPENDENT_AMBULATORY_CARE_PROVIDER_SITE_OTHER): Payer: Medicare Other | Admitting: Internal Medicine

## 2015-08-10 ENCOUNTER — Encounter: Payer: Self-pay | Admitting: Internal Medicine

## 2015-08-10 VITALS — BP 138/78 | HR 81 | Temp 98.4°F | Wt 136.0 lb

## 2015-08-10 DIAGNOSIS — M064 Inflammatory polyarthropathy: Secondary | ICD-10-CM | POA: Diagnosis not present

## 2015-08-10 DIAGNOSIS — M791 Myalgia: Secondary | ICD-10-CM | POA: Diagnosis not present

## 2015-08-10 DIAGNOSIS — M609 Myositis, unspecified: Secondary | ICD-10-CM

## 2015-08-10 DIAGNOSIS — M130 Polyarthritis, unspecified: Secondary | ICD-10-CM

## 2015-08-10 DIAGNOSIS — IMO0001 Reserved for inherently not codable concepts without codable children: Secondary | ICD-10-CM

## 2015-08-10 DIAGNOSIS — R109 Unspecified abdominal pain: Secondary | ICD-10-CM

## 2015-08-10 DIAGNOSIS — R1032 Left lower quadrant pain: Secondary | ICD-10-CM

## 2015-08-10 DIAGNOSIS — R1030 Lower abdominal pain, unspecified: Secondary | ICD-10-CM

## 2015-08-10 LAB — POCT URINALYSIS DIPSTICK
Bilirubin, UA: NEGATIVE
GLUCOSE UA: NEGATIVE
Ketones, UA: NEGATIVE
Leukocytes, UA: NEGATIVE
NITRITE UA: NEGATIVE
PROTEIN UA: NEGATIVE
Spec Grav, UA: >=1.03
UROBILINOGEN UA: 0.2
pH, UA: 5.5

## 2015-08-10 MED ORDER — DOXYCYCLINE HYCLATE 100 MG PO CAPS: 100.0000 mg | ORAL_CAPSULE | Freq: Two times a day (BID) | ORAL | Status: DC

## 2015-08-10 NOTE — Progress Notes (Signed)
Pre visit review using our clinic review tool, if applicable. No additional management support is needed unless otherwise documented below in the visit note. 

## 2015-08-10 NOTE — Progress Notes (Signed)
Subjective:  Patient ID: [name], female    DOB: 1951/09/12  Age: 64 y.o. MRN: [id number]  CC: The primary encounter diagnosis was Polyarthritis. Diagnoses of Myalgia and myositis, Lt flank pain, Polyarthritis of multiple sites [facility]), and Left inguinal pain were also pertinent to this visit.  HPI [name] presents for urgent work in for acute illness.    For the past week she has been experiencing migratory joint pain accompanied by joint swelling and myalgias.  The symptoms involve the upper body and low back, and she had one epsiode of lfet knee stiffness as well. She has had  documented fevers  To 101.2  Orally.  She reports bilateral frontal headaches for the past week, which have been intermittent  But occurring in the  Afternoon almost daily, accompanied by fatigue.  She is physically very active , works outside in her yard boh here and at [employer] She has a remote history of ehrlichiosis and is concerned that her current symptoms are reminiscent of her prior illness.   Recent episode of left inguinal pain, concerned that she may have a ureteral calculi as there is  A history of 4 renal calculi seen on prior CT,  Non o bstructing . Denies hematuria    Outpatient Prescriptions Prior to Visit  Medication Sig Dispense Refill  . Azelastine-Fluticasone (DYMISTA) 137-50 MCG/ACT SUSP Place 2 Squirts into the nose daily. In each nostril 23 g 11  . Calcium Carbonate (CALTRATE 600 PO) Take 1 tablet by mouth daily.    . famotidine (PEPCID) 20 MG tablet Take 1 tablet (20 mg total) by mouth 2 (two) times daily. 180 tablet 1  . lisinopril (PRINIVIL,ZESTRIL) 5 MG tablet TAKE ONE TABLET BY MOUTH ONCE DAILY 90 tablet 1  . Multiple Vitamins-Minerals (MULTIVITAMIN WITH MINERALS) tablet Take 1 tablet by mouth daily.    . mupirocin ointment (BACTROBAN) 2 % Place 1 application into the nose 2 (two) times daily. As needed 22 g 5   No facility-administered medications prior to visit.     Review of Systems;  Patient denies  unintentional weight loss, skin rash, eye pain, sinus congestion and sinus pain, sore throat, dysphagia,  hemoptysis , cough, dyspnea, wheezing, chest pain, palpitations, orthopnea, edema, abdominal pain, nausea, melena, diarrhea, constipation,  dysuria, hematuria, urinary  Frequency, nocturia, numbness, tingling, seizures,  Focal weakness, Loss of consciousness,  Tremor, insomnia, depression, anxiety, and suicidal ideation.      Objective:  BP 138/78 mmHg  Pulse 81  Temp(Src) 98.4 F (36.9 C) (Oral)  Wt 136 lb (61.689 kg)  SpO2 96%  BP Readings from Last 3 Encounters:  08/10/15 138/78  04/01/15 118/64  10/01/14 126/78    Wt Readings from Last 3 Encounters:  08/10/15 136 lb (61.689 kg)  04/01/15 133 lb (60.328 kg)  10/01/14 135 lb 8 oz (61.462 kg)    General appearance: alert, cooperative and appears stated age.  Not ill appearing  Ears: normal TM's and external ear canals both ears Throat: lips, mucosa, and tongue normal; teeth and gums normal Neck: no adenopathy, no carotid bruit, supple, symmetrical, trachea midline and thyroid not enlarged, symmetric, no tenderness/mass/nodules Back: symmetric, no curvature. ROM normal. No CVA tenderness. Lungs: clear to auscultation bilaterally Heart: regular rate and rhythm, S1, S2 normal, no murmur, click, rub or gallop Abdomen: soft, non-tender; bowel sounds normal; no masses,  no organomegaly Pulses: 2+ and symmetric Skin: Skin color, texture, turgor normal. No rashes or lesions Lymph nodes: Cervical,  supraclavicular, and axillary nodes normal. MSK: enlarged PIPS bilaterally in fingers 2-4 without synovitis of hands, wrists,  Knees, elbows or shoulders.   No results found for: HGBA1C  Lab Results  Component Value Date   CREATININE 0.71 04/01/2015   CREATININE 0.8 10/08/2014   CREATININE 0.8 04/07/2014    Lab Results  Component Value Date   WBC 3.5* 10/08/2014   HGB 14.0 10/08/2014    HCT 42.2 10/08/2014   PLT 168.0 10/08/2014   GLUCOSE 85 04/01/2015   CHOL 223* 10/08/2014   TRIG 62.0 10/08/2014   HDL 51.80 10/08/2014   LDLDIRECT 181.4 09/26/2013   LDLCALC 159* 10/08/2014   ALT 18 04/01/2015   AST 20 04/01/2015   NA 139 04/01/2015   K 4.4 04/01/2015   CL 103 04/01/2015   CREATININE 0.71 04/01/2015   BUN 17 04/01/2015   CO2 31 04/01/2015   TSH 4.55* 10/08/2014    Mm Screening Breast Tomo Bilateral  02/16/2015  CLINICAL DATA:  Screening. EXAM: DIGITAL SCREENING BILATERAL MAMMOGRAM WITH 3D TOMO WITH CAD COMPARISON:  Previous exam(s). ACR Breast Density Category b: There are scattered areas of fibroglandular density. FINDINGS: There are no findings suspicious for malignancy. Images were processed with CAD. IMPRESSION: No mammographic evidence of malignancy. A result letter of this screening mammogram will be mailed directly to the patient. RECOMMENDATION: Screening mammogram in one year. (Code:SM-B-01Y) BI-RADS CATEGORY  1: Negative. Electronically Signed   By: Altamese Cabal M.D.   On: 02/16/2015 11:21    Assessment & Plan:   Problem List Items Addressed This Visit    Polyarthritis of multiple sites [facility])    With headaches, objective fevers,  Tick exposure and remote history of ehrlichiosis.  Discussed empiric treatment with doxycyline if the ESR or CRP are abnormal.        Left inguinal pain    Likely muscular due to current illness,  But trace hematuria on dipstick noted and has history of calculi by prior CT,  Formal UA pending.        Other Visit Diagnoses    Polyarthritis    -  Primary    Relevant Orders    Sedimentation rate    C-reactive protein    CBC with Differential/Platelet    Ehrlichia antibody panel    Comprehensive metabolic panel    CBC with Differential/Platelet    Myalgia and myositis        Relevant Orders    CK    Lt flank pain        Relevant Orders    POCT urinalysis dipstick (Completed)    Urinalysis, Routine w reflex  microscopic    Urine Culture       I am having Ms. Gerdeman start on doxycycline. I am also having her maintain her Azelastine-Fluticasone, lisinopril, Calcium Carbonate (CALTRATE 600 PO), multivitamin with minerals, mupirocin ointment, and famotidine.  Meds ordered this encounter  Medications  . doxycycline (VIBRAMYCIN) 100 MG capsule    Sig: Take 1 capsule (100 mg total) by mouth 2 (two) times daily.    Dispense:  20 capsule    Refill:  0    There are no discontinued medications.  Follow-up: No Follow-up on file.   [name], MD

## 2015-08-11 ENCOUNTER — Encounter: Payer: Self-pay | Admitting: Internal Medicine

## 2015-08-11 ENCOUNTER — Telehealth: Payer: Self-pay | Admitting: Internal Medicine

## 2015-08-11 DIAGNOSIS — R1032 Left lower quadrant pain: Secondary | ICD-10-CM | POA: Insufficient documentation

## 2015-08-11 DIAGNOSIS — M130 Polyarthritis, unspecified: Secondary | ICD-10-CM | POA: Insufficient documentation

## 2015-08-11 LAB — SEDIMENTATION RATE: Sed Rate: 90 mm/hr — ABNORMAL HIGH (ref 0–22)

## 2015-08-11 LAB — CBC WITH DIFFERENTIAL/PLATELET
Basophils Absolute: 0 10*3/uL (ref 0.0–0.1)
Basophils Relative: 0.5 % (ref 0.0–3.0)
EOS ABS: 0.1 10*3/uL (ref 0.0–0.7)
Eosinophils Relative: 1.8 % (ref 0.0–5.0)
HCT: 35.6 % — ABNORMAL LOW (ref 36.0–46.0)
Hemoglobin: 11.9 g/dL — ABNORMAL LOW (ref 12.0–15.0)
LYMPHS PCT: 18.3 % (ref 12.0–46.0)
Lymphs Abs: 0.9 10*3/uL (ref 0.7–4.0)
MCHC: 33.5 g/dL (ref 30.0–36.0)
MCV: 92.1 fl (ref 78.0–100.0)
MONOS PCT: 11 % (ref 3.0–12.0)
Monocytes Absolute: 0.5 10*3/uL (ref 0.1–1.0)
NEUTROS PCT: 68.4 % (ref 43.0–77.0)
Neutro Abs: 3.4 10*3/uL (ref 1.4–7.7)
Platelets: 204 10*3/uL (ref 150.0–400.0)
RBC: 3.86 Mil/uL — AB (ref 3.87–5.11)
RDW: 13.1 % (ref 11.5–15.5)
WBC: 5 10*3/uL (ref 4.0–10.5)

## 2015-08-11 LAB — COMPREHENSIVE METABOLIC PANEL
ALT: 13 U/L (ref 0–35)
AST: 15 U/L (ref 0–37)
Albumin: 3.9 g/dL (ref 3.5–5.2)
Alkaline Phosphatase: 57 U/L (ref 39–117)
BILIRUBIN TOTAL: 0.5 mg/dL (ref 0.2–1.2)
BUN: 18 mg/dL (ref 6–23)
CO2: 28 meq/L (ref 19–32)
CREATININE: 0.7 mg/dL (ref 0.40–1.20)
Calcium: 9.4 mg/dL (ref 8.4–10.5)
Chloride: 104 mEq/L (ref 96–112)
GFR: 88.93 mL/min (ref >60.00)
Glucose, Bld: 85 mg/dL (ref 70–99)
Potassium: 3.9 mEq/L (ref 3.5–5.1)
SODIUM: 140 meq/L (ref 135–145)
TOTAL PROTEIN: 6.7 g/dL (ref 6.0–8.3)

## 2015-08-11 LAB — C-REACTIVE PROTEIN: CRP: 5.7 mg/dL (ref 0.5–20.0)

## 2015-08-11 LAB — CK: Total CK: 107 U/L (ref 7–177)

## 2015-08-11 NOTE — Assessment & Plan Note (Signed)
Likely muscular due to current illness,  But trace hematuria on dipstick noted and has history of calculi by prior CT,  Formal UA pending.

## 2015-08-11 NOTE — Assessment & Plan Note (Signed)
With headaches, objective fevers,  Tick exposure and remote history of ehrlichiosis.  Discussed empiric treatment with doxycyline if the ESR or CRP are abnormal.

## 2015-08-11 NOTE — Telephone Encounter (Signed)
Wanting labs posted to [facility].

## 2015-08-12 LAB — URINE CULTURE
COLONY COUNT: NO GROWTH
Organism ID, Bacteria: NO GROWTH

## 2015-08-13 ENCOUNTER — Encounter: Payer: Self-pay | Admitting: Internal Medicine

## 2015-08-20 ENCOUNTER — Telehealth: Payer: Self-pay | Admitting: Internal Medicine

## 2015-08-20 ENCOUNTER — Other Ambulatory Visit: Payer: Self-pay | Admitting: Internal Medicine

## 2015-08-20 NOTE — Telephone Encounter (Signed)
She can have it on the medication per Dr. Derrel Nip.

## 2015-08-20 NOTE — Telephone Encounter (Signed)
Pt would like to get the Flu shot,but she wants to know if she has to be off of the Doxycycline? She'll be done on Sunday. Thank You!

## 2015-08-25 ENCOUNTER — Ambulatory Visit (INDEPENDENT_AMBULATORY_CARE_PROVIDER_SITE_OTHER): Payer: Medicare Other

## 2015-08-25 DIAGNOSIS — Z23 Encounter for immunization: Secondary | ICD-10-CM

## 2015-08-28 DIAGNOSIS — H2513 Age-related nuclear cataract, bilateral: Secondary | ICD-10-CM | POA: Diagnosis not present

## 2015-09-03 ENCOUNTER — Other Ambulatory Visit: Payer: Medicare Other

## 2015-09-06 ENCOUNTER — Encounter: Payer: Self-pay | Admitting: Internal Medicine

## 2015-09-06 LAB — EHRLICHIA ANTIBODY PANEL
E chaffeensis (HGE) Ab, IgG: <1:64 {titer}
E chaffeensis (HGE) Ab, IgM: <1:20 {titer}

## 2015-09-11 ENCOUNTER — Other Ambulatory Visit: Payer: Self-pay | Admitting: Internal Medicine

## 2015-10-05 ENCOUNTER — Other Ambulatory Visit: Payer: PRIVATE HEALTH INSURANCE

## 2015-10-05 ENCOUNTER — Other Ambulatory Visit (INDEPENDENT_AMBULATORY_CARE_PROVIDER_SITE_OTHER): Payer: Medicare Other

## 2015-10-05 DIAGNOSIS — M130 Polyarthritis, unspecified: Secondary | ICD-10-CM

## 2015-10-05 LAB — CBC WITH DIFFERENTIAL/PLATELET
Basophils Absolute: 0 10*3/uL (ref 0.0–0.1)
Basophils Relative: 0.5 % (ref 0.0–3.0)
EOS PCT: 1.3 % (ref 0.0–5.0)
Eosinophils Absolute: 0.1 10*3/uL (ref 0.0–0.7)
HEMATOCRIT: 41 % (ref 36.0–46.0)
HEMOGLOBIN: 13.5 g/dL (ref 12.0–15.0)
Lymphocytes Relative: 25.3 % (ref 12.0–46.0)
Lymphs Abs: 1.1 10*3/uL (ref 0.7–4.0)
MCHC: 32.9 g/dL (ref 30.0–36.0)
MCV: 92.9 fl (ref 78.0–100.0)
MONOS PCT: 8.3 % (ref 3.0–12.0)
Monocytes Absolute: 0.4 10*3/uL (ref 0.1–1.0)
Neutro Abs: 2.9 10*3/uL (ref 1.4–7.7)
Neutrophils Relative %: 64.6 % (ref 43.0–77.0)
Platelets: 181 10*3/uL (ref 150.0–400.0)
RBC: 4.42 Mil/uL (ref 3.87–5.11)
RDW: 13.6 % (ref 11.5–15.5)
WBC: 4.5 10*3/uL (ref 4.0–10.5)

## 2015-10-07 ENCOUNTER — Encounter: Payer: Self-pay | Admitting: Internal Medicine

## 2015-10-07 ENCOUNTER — Ambulatory Visit (INDEPENDENT_AMBULATORY_CARE_PROVIDER_SITE_OTHER): Payer: Medicare Other | Admitting: Internal Medicine

## 2015-10-07 VITALS — BP 122/74 | HR 61 | Temp 97.8°F | Resp 12 | Ht 61.5 in | Wt 128.8 lb

## 2015-10-07 DIAGNOSIS — I10 Essential (primary) hypertension: Secondary | ICD-10-CM

## 2015-10-07 DIAGNOSIS — Z23 Encounter for immunization: Secondary | ICD-10-CM | POA: Diagnosis not present

## 2015-10-07 DIAGNOSIS — E559 Vitamin D deficiency, unspecified: Secondary | ICD-10-CM

## 2015-10-07 DIAGNOSIS — M064 Inflammatory polyarthropathy: Secondary | ICD-10-CM

## 2015-10-07 DIAGNOSIS — M130 Polyarthritis, unspecified: Secondary | ICD-10-CM

## 2015-10-07 DIAGNOSIS — Z1159 Encounter for screening for other viral diseases: Secondary | ICD-10-CM

## 2015-10-07 DIAGNOSIS — Z1239 Encounter for other screening for malignant neoplasm of breast: Secondary | ICD-10-CM

## 2015-10-07 DIAGNOSIS — R5383 Other fatigue: Secondary | ICD-10-CM

## 2015-10-07 DIAGNOSIS — E785 Hyperlipidemia, unspecified: Secondary | ICD-10-CM

## 2015-10-07 DIAGNOSIS — Z0000 Encounter for general adult medical examination without abnormal findings: Secondary | ICD-10-CM

## 2015-10-07 LAB — CBC WITH DIFFERENTIAL/PLATELET
Basophils Absolute: 0 10*3/uL (ref 0.0–0.1)
Basophils Relative: 0.6 % (ref 0.0–3.0)
EOS PCT: 2.2 % (ref 0.0–5.0)
Eosinophils Absolute: 0.1 10*3/uL (ref 0.0–0.7)
HCT: 40.1 % (ref 36.0–46.0)
Hemoglobin: 13.5 g/dL (ref 12.0–15.0)
LYMPHS ABS: 1 10*3/uL (ref 0.7–4.0)
Lymphocytes Relative: 26 % (ref 12.0–46.0)
MCHC: 33.7 g/dL (ref 30.0–36.0)
MCV: 91.7 fl (ref 78.0–100.0)
MONOS PCT: 7.9 % (ref 3.0–12.0)
Monocytes Absolute: 0.3 10*3/uL (ref 0.1–1.0)
NEUTROS ABS: 2.4 10*3/uL (ref 1.4–7.7)
Neutrophils Relative %: 63.3 % (ref 43.0–77.0)
Platelets: 171 10*3/uL (ref 150.0–400.0)
RBC: 4.38 Mil/uL (ref 3.87–5.11)
RDW: 13.4 % (ref 11.5–15.5)
WBC: 3.9 10*3/uL — ABNORMAL LOW (ref 4.0–10.5)

## 2015-10-07 LAB — LIPID PANEL
Cholesterol: 202 mg/dL — ABNORMAL HIGH (ref 0–200)
HDL: 56.1 mg/dL (ref >39.00)
LDL Cholesterol: 133 mg/dL — ABNORMAL HIGH (ref 0–99)
NONHDL: 145.6
Total CHOL/HDL Ratio: 4
Triglycerides: 65 mg/dL (ref 0.0–149.0)
VLDL: 13 mg/dL (ref 0.0–40.0)

## 2015-10-07 LAB — VITAMIN D 25 HYDROXY (VIT D DEFICIENCY, FRACTURES): VITD: 31 ng/mL (ref 30.00–100.00)

## 2015-10-07 NOTE — Patient Instructions (Signed)

## 2015-10-07 NOTE — Progress Notes (Signed)
Patient ID: [name], female    DOB: [birth year]  Age: 64 y.o. MRN: [id number]  The patient is here for annual Medicare wellness examination and management of other chronic and acute problems.   Mammogram ordered  Colonoscopy 2013,  Oh  5 yr follow up due to remote  polyp (per patient)  History of stones,  5 on the left pre last urology eval, had an epiodse of back and flank pain  Recently .  Did not feel like she passed one,  Pain resolved,    The risk factors are reflected in the social history.  The roster of all physicians providing medical care to patient - is listed in the Snapshot section of the chart.  Activities of daily living:  The patient is 100% independent in all ADLs: dressing, toileting, feeding as well as independent mobility  Home safety : The patient has smoke detectors in the home. They wear seatbelts.  There are no firearms at home. There is no violence in the home.   There is no risks for hepatitis, STDs or HIV. There is no   history of blood transfusion. They have no travel history to infectious disease endemic areas of the world.  The patient has seen their dentist in the last six month. They have seen their eye doctor in the last year. They admit to slight hearing difficulty with regard to whispered voices and some television programs.  They have deferred audiologic testing in the last year.  They do not  have excessive sun exposure. Discussed the need for sun protection: hats, long sleeves and use of sunscreen if there is significant sun exposure.   Diet: the importance of a healthy diet is discussed. They do have a healthy diet.  The benefits of regular aerobic exercise were discussed. She walks 4 times per week ,  20 minutes.   Depression screen: there are no signs or vegative symptoms of depression- irritability, change in appetite, anhedonia, sadness/tearfullness.  Cognitive assessment: the patient manages all their financial and personal affairs and is  actively engaged. They could relate day,date,year and events; recalled 2/3 objects at 3 minutes; performed clock-face test normally.  The following portions of the patient's history were reviewed and updated as appropriate: allergies, current medications, past family history, past medical history,  past surgical history, past social history  and problem list.  Visual acuity was not assessed per patient preference since she has regular follow up with her ophthalmologist. Hearing and body mass index were assessed and reviewed.   During the course of the visit the patient was educated and counseled about appropriate screening and preventive services including : fall prevention , diabetes screening, nutrition counseling, colorectal cancer screening, and recommended immunizations.    CC: The primary encounter diagnosis was Breast cancer screening. Diagnoses of Other fatigue, Vitamin D deficiency, Hyperlipidemia with target LDL less than 160, Need for hepatitis C screening test, Need for prophylactic vaccination against Streptococcus pneumoniae (pneumococcus), Essential hypertension, benign, Hyperlipidemia with target LDL less than 130, Polyarthritis of multiple sites [facility]), and Encounter for preventive health examination were also pertinent to this visit.  Her previous episode of polyathralgias and extreme fatigue has resolved spontaneously.  Reviewed prior serologies.. History [name] has a past medical history of Arthritis; Chicken pox; GERD (gastroesophageal reflux disease); Allergy; Hyperlipidemia; Hypertension; Adenomatous polyps (2010); and Pneumonia.   She has past surgical history that includes Spine surgery.   Her family history includes Cancer in her mother; Diabetes in her mother; Heart disease  in her father and mother; Hypertension in her father and mother; Stroke in her mother.She reports that she has never smoked. She has never used smokeless tobacco. She reports that she drinks about 4.2 oz  of alcohol per week. She reports that she does not use illicit drugs.  Outpatient Prescriptions Prior to Visit  Medication Sig Dispense Refill  . famotidine (PEPCID) 20 MG tablet TAKE ONE TABLET BY MOUTH TWICE DAILY 180 tablet 0  . lisinopril (PRINIVIL,ZESTRIL) 5 MG tablet TAKE ONE TABLET BY MOUTH ONCE DAILY 90 tablet 0  . Multiple Vitamins-Minerals (MULTIVITAMIN WITH MINERALS) tablet Take 1 tablet by mouth daily.    . mupirocin ointment (BACTROBAN) 2 % Place 1 application into the nose 2 (two) times daily. As needed 22 g 5  . Azelastine-Fluticasone (DYMISTA) 137-50 MCG/ACT SUSP Place 2 Squirts into the nose daily. In each nostril (Patient not taking: Reported on 10/07/2015) 23 g 11  . Calcium Carbonate (CALTRATE 600 PO) Take 1 tablet by mouth daily. Reported on 10/07/2015    . doxycycline (VIBRAMYCIN) 100 MG capsule Take 1 capsule (100 mg total) by mouth 2 (two) times daily. (Patient not taking: Reported on 10/07/2015) 20 capsule 0   No facility-administered medications prior to visit.    Review of Systems   Patient denies headache, fevers, malaise, unintentional weight loss, skin rash, eye pain, sinus congestion and sinus pain, sore throat, dysphagia,  hemoptysis , cough, dyspnea, wheezing, chest pain, palpitations, orthopnea, edema, abdominal pain, nausea, melena, diarrhea, constipation, flank pain, dysuria, hematuria, urinary  Frequency, nocturia, numbness, tingling, seizures,  Focal weakness, Loss of consciousness,  Tremor, insomnia, depression, anxiety, and suicidal ideation.     Objective:  BP 122/74 mmHg  Pulse 61  Temp(Src) 97.8 F (36.6 C) (Oral)  Resp 12  Ht 5' 1.5" (1.562 m)  Wt 128 lb 12 oz (58.401 kg)  BMI 23.94 kg/m2  SpO2 99%  Physical Exam   General appearance: alert, cooperative and appears stated age Head: Normocephalic, without obvious abnormality, atraumatic Eyes: conjunctivae/corneas clear. PERRL, EOM's intact. Fundi benign. Ears: normal TM's and external  ear canals both ears Nose: Nares normal. Septum midline. Mucosa normal. No drainage or sinus tenderness. Throat: lips, mucosa, and tongue normal; teeth and gums normal Neck: no adenopathy, no carotid bruit, no JVD, supple, symmetrical, trachea midline and thyroid not enlarged, symmetric, no tenderness/mass/nodules Lungs: clear to auscultation bilaterally Breasts: normal appearance, no masses or tenderness Heart: regular rate and rhythm, S1, S2 normal, no murmur, click, rub or gallop Abdomen: soft, non-tender; bowel sounds normal; no masses,  no organomegaly Extremities: extremities normal, atraumatic, no cyanosis or edema Pulses: 2+ and symmetric Skin: Skin color, texture, turgor normal. No rashes or lesions Neurologic: Alert and oriented X 3, normal strength and tone. Normal symmetric reflexes. Normal coordination and gait.     Assessment & Plan:   Problem List Items Addressed This Visit    Essential hypertension, benign    Well controlled on current regimen. Renal function stable, no changes today.  Lab Results  Component Value Date   CREATININE 0.70 08/10/2015   Lab Results  Component Value Date   NA 140 08/10/2015   K 3.9 08/10/2015   CL 104 08/10/2015   CO2 28 08/10/2015         Hyperlipidemia with target LDL less than 130    10 yr risk risk of CAD is 9% using Framingham risk calculator. Statin intolerance noted. No meds indicated.  Lab Results  Component Value Date   CHOL  202* 10/07/2015   HDL 56.10 10/07/2015   LDLCALC 133* 10/07/2015   LDLDIRECT 181.4 09/26/2013   TRIG 65.0 10/07/2015   CHOLHDL 4 10/07/2015            Polyarthritis of multiple sites ([place])    With headaches, objective fevers,  Tick exposure and remote history of ehrlichiosis.  ESR was very elevated,  But erhrlichiosis panel was negative.   All symptoms have resolved      Encounter for preventive health examination    Annual comprehensive preventive exam was done as well as an  evaluation and management of chronic conditions .  During the course of the visit the patient was educated and counseled about appropriate screening and preventive services including :  diabetes screening, lipid analysis with projected  10 year  risk for CAD , nutrition counseling, colorectal cancer screening, and recommended immunizations.  Printed recommendations for health maintenance screenings was given.        Other Visit Diagnoses    Breast cancer screening    -  Primary    Relevant Orders    MM DIGITAL SCREENING BILATERAL    Other fatigue        Relevant Orders    CBC with Differential/Platelet (Completed)    T4 AND TSH (Completed)    Vitamin D deficiency        Relevant Orders    VITAMIN D 25 Hydroxy (Vit-D Deficiency, Fractures) (Completed)    Hyperlipidemia with target LDL less than 160        Relevant Orders    Lipid panel (Completed)    Need for hepatitis C screening test        Relevant Orders    Hepatitis C antibody (Completed)    Need for prophylactic vaccination against Streptococcus pneumoniae (pneumococcus)        Relevant Orders    Pneumococcal polysaccharide vaccine 23-valent greater than or equal to 2yo subcutaneous/IM (Completed)      A total of 40 minutes was spent with patient more than half of which was spent in counseling patient on the above mentioned issues , reviewing and explaining recent labs and imaging studies done, and coordination of care.   I am having Ms. Zaldivar maintain her Azelastine-Fluticasone, Calcium Carbonate (CALTRATE 600 PO), multivitamin with minerals, mupirocin ointment, doxycycline, lisinopril, and famotidine.  No orders of the defined types were placed in this encounter.    There are no discontinued medications.  Follow-up: Return for [employer] wellness with denisa.   [name], MD

## 2015-10-07 NOTE — Progress Notes (Signed)
Pre-visit discussion using our clinic review tool. No additional management support is needed unless otherwise documented below in the visit note.  

## 2015-10-08 ENCOUNTER — Encounter: Payer: Self-pay | Admitting: Internal Medicine

## 2015-10-08 LAB — HEPATITIS C ANTIBODY: HCV AB: NEGATIVE

## 2015-10-08 LAB — T4 AND TSH
T4, Total: 6.5 ug/dL (ref 4.5–12.0)
TSH: 3.12 u[IU]/mL (ref 0.450–4.500)

## 2015-10-09 DIAGNOSIS — Z0000 Encounter for general adult medical examination without abnormal findings: Secondary | ICD-10-CM | POA: Insufficient documentation

## 2015-10-09 NOTE — Assessment & Plan Note (Signed)
Annual comprehensive preventive exam was done as well as an evaluation and management of chronic conditions .  During the course of the visit the patient was educated and counseled about appropriate screening and preventive services including :  diabetes screening, lipid analysis with projected  10 year  risk for CAD , nutrition counseling, colorectal cancer screening, and recommended immunizations.  Printed recommendations for health maintenance screenings was given.   

## 2015-10-09 NOTE — Assessment & Plan Note (Signed)
10 yr risk risk of CAD is 9% using Framingham risk calculator. Statin intolerance noted. No meds indicated.  Lab Results  Component Value Date   CHOL 202* 10/07/2015   HDL 56.10 10/07/2015   LDLCALC 133* 10/07/2015   LDLDIRECT 181.4 09/26/2013   TRIG 65.0 10/07/2015   CHOLHDL 4 10/07/2015

## 2015-10-09 NOTE — Assessment & Plan Note (Addendum)
With headaches, objective fevers,  Tick exposure and remote history of ehrlichiosis.  ESR was very elevated,  But erhrlichiosis panel was negative.   All symptoms have resolved

## 2015-10-09 NOTE — Assessment & Plan Note (Signed)
Well controlled on current regimen. Renal function stable, no changes today.  Lab Results  Component Value Date   CREATININE 0.70 08/10/2015   Lab Results  Component Value Date   NA 140 08/10/2015   K 3.9 08/10/2015   CL 104 08/10/2015   CO2 28 08/10/2015

## 2015-10-15 ENCOUNTER — Ambulatory Visit (INDEPENDENT_AMBULATORY_CARE_PROVIDER_SITE_OTHER): Payer: Medicare Other

## 2015-10-15 VITALS — BP 124/68 | HR 64 | Temp 97.1°F | Resp 12 | Ht 61.5 in | Wt 132.0 lb

## 2015-10-15 DIAGNOSIS — Z0000 Encounter for general adult medical examination without abnormal findings: Secondary | ICD-10-CM | POA: Diagnosis not present

## 2015-10-15 NOTE — Progress Notes (Signed)
Subjective:   [name] is a 64 y.o. female who presents for an Initial Medicare Annual Wellness Visit.  Review of Systems    No ROS.  Medicare Wellness Visit.  Cardiac Risk Factors include: advanced age (>84men, >39 women);sedentary lifestyle;hypertension     Objective:    Today's Vitals   10/15/15 0809  BP: 124/68  Pulse: 64  Temp: 97.1 F (36.2 C)  TempSrc: Oral  Resp: 12  Height: 5' 1.5" (1.562 m)  Weight: 132 lb (59.875 kg)  SpO2: 99%    Current Medications (verified) Outpatient Encounter Prescriptions as of 10/15/2015  Medication Sig  . Azelastine-Fluticasone (DYMISTA) 137-50 MCG/ACT SUSP Place 2 Squirts into the nose daily. In each nostril  . Calcium Carbonate (CALTRATE 600 PO) Take 1 tablet by mouth daily. Reported on 10/07/2015  . doxycycline (VIBRAMYCIN) 100 MG capsule Take 1 capsule (100 mg total) by mouth 2 (two) times daily.  . famotidine (PEPCID) 20 MG tablet TAKE ONE TABLET BY MOUTH TWICE DAILY  . lisinopril (PRINIVIL,ZESTRIL) 5 MG tablet TAKE ONE TABLET BY MOUTH ONCE DAILY  . Multiple Vitamins-Minerals (MULTIVITAMIN WITH MINERALS) tablet Take 1 tablet by mouth daily.  . mupirocin ointment (BACTROBAN) 2 % Place 1 application into the nose 2 (two) times daily. As needed   No facility-administered encounter medications on file as of 10/15/2015.    Allergies (verified) Contrast media; Iodine; Milk-related compounds; Corn-containing products; Penicillins; and Sulfa antibiotics   History: Past Medical History  Diagnosis Date  . Arthritis   . Chicken pox   . GERD (gastroesophageal reflux disease)   . Allergy   . Hyperlipidemia   . Hypertension   . Adenomatous polyps 2010    colon  . Pneumonia    Past Surgical History  Procedure Laterality Date  . Spine surgery     Family History  Problem Relation Age of Onset  . Cancer Mother   . Stroke Mother   . Heart disease Mother   . Hypertension Mother   . Diabetes Mother   . Heart disease  Father   . Hypertension Father    Social History   Occupational History  . Not on file.   Social History Main Topics  . Smoking status: Never Smoker   . Smokeless tobacco: Never Used  . Alcohol Use: 4.2 oz/week    7 Glasses of wine per week  . Drug Use: No  . Sexual Activity: Yes    Tobacco Counseling Counseling given: Not Answered   Activities of Daily Living In your present state of health, do you have any difficulty performing the following activities: 10/15/2015  Hearing? N  Vision? N  Difficulty concentrating or making decisions? N  Walking or climbing stairs? N  Dressing or bathing? N  Doing errands, shopping? N  Preparing Food and eating ? N  Using the Toilet? N  In the past six months, have you accidently leaked urine? N  Do you have problems with loss of bowel control? N  Managing your Medications? N  Managing your Finances? N  Housekeeping or managing your Housekeeping? N    Immunizations and Health Maintenance Immunization History  Administered Date(s) Administered  . Influenza Split 07/26/2012, 08/27/2013  . Influenza, High Dose Seasonal PF 08/25/2015  . Influenza-Unspecified 08/29/2014  . Pneumococcal Conjugate-13 09/02/2014  . Pneumococcal Polysaccharide-23 06/27/2007, 10/07/2015  . Tdap 12/25/2010  . Zoster 09/26/2013   There are no preventive care reminders to display for this patient.  Patient Care Team: [name], MD  as PCP - General (Internal Medicine)  Indicate any recent Medical Services you may have received from other than [facility] providers in the past year (date may be approximate).     Assessment:   This is a routine wellness examination for [name].  The goal of the wellness visit is to assist the patient how to close the gaps in care and create a preventative care plan for the patient.   VIT D Calcium as appropriate/ Osteoporosis risk reviewed.  Medications reviewed; taking without issues or barriers.   Safety issues  reviewed; smoke detectors in the home. Firearms locked in a safe and secure area in the home.  Wears seatbelts when driving or riding with others. No violence in the home.  No identified risk were noted; The patient was oriented x 3; appropriate in dress and manner and no objective failures at ADL's or IADL's.   Patient Concerns:None at this time.  Follow up with PCP as needed.    Hearing/Vision screen Hearing Screening Comments: Passes the whisper test Vision Screening Comments: Followed [facility], Dr. Mordecai Rasmussen Annual visits Wears glasses  Dietary issues and exercise activities discussed: Current Exercise Habits:: Home exercise routine, Type of exercise: walking;stretching, Time (Minutes): 30, Frequency (Times/Week): 4, Weekly Exercise (Minutes/Week): 120, Intensity: Moderate  Goals    . Healthy lifestyle     Maintain healthy lifestyle:  Continue walking with husband, skiing, monitoring intake of dairy products, drink plenty of water, eat fresh fruits and vegetables.      Depression Screen PHQ 2/9 Scores 10/15/2015 10/01/2014  PHQ - 2 Score 0 0    Fall Risk Fall Risk  10/15/2015 10/01/2014  Falls in the past year? No No    Cognitive Function: MMSE - Mini Mental State Exam 10/15/2015  Orientation to time 5  Orientation to Place 5  Registration 3  Attention/ Calculation 5  Recall 3  Language- name 2 objects 2  Language- repeat 1  Language- follow 3 step command 3  Language- read & follow direction 1  Write a sentence 1  Copy design 1  Total score 30    Screening Tests Health Maintenance  Topic Date Due  . MAMMOGRAM  02/16/2016  . INFLUENZA VACCINE  05/24/2016  . TETANUS/TDAP  12/24/2020  . COLONOSCOPY  01/24/2022  . DEXA SCAN  Completed  . ZOSTAVAX  Completed  . Hepatitis C Screening  Completed  . PNA vac Low Risk Adult  Completed      Plan:   End of life planning was discussed; Advanced aging and Advanced Directives pertaining to Healthcare  Power of [place], Living Will and DNR. Education provided. Total time spent with discussion of HCPOA/Living Will short form is 20 minutes.   During the course of the visit, [name] was educated and counseled about the following appropriate screening and preventive services:   Vaccines to include Pneumoccal, Influenza, Hepatitis B, Td, Zostavax, HCV  Electrocardiogram  Cardiovascular disease screening  Colorectal cancer screening  Bone density screening  Diabetes screening  Glaucoma screening  Mammography/PAP  Nutrition counseling  Smoking cessation counseling  Patient Instructions (the written plan) were given to the patient.    Varney Biles, LPN   [id number]

## 2015-10-15 NOTE — Progress Notes (Signed)
  I have reviewed the above information and agree with above.   Abdirahman Chittum, MD 

## 2015-10-15 NOTE — Patient Instructions (Addendum)
[name],  Thank you for taking time to come for your Medicare Wellness Visit.  I appreciate your ongoing commitment to your health goals. Please review the following plan we discussed and let me know if I can assist you in the future.  Happy Holidays!   Health Maintenance, Female Adopting a healthy lifestyle and getting preventive care can go a long way to promote health and wellness. Talk with your health care provider about what schedule of regular examinations is right for you. This is a good chance for you to check in with your provider about disease prevention and staying healthy. In between checkups, there are plenty of things you can do on your own. Experts have done a lot of research about which lifestyle changes and preventive measures are most likely to keep you healthy. Ask your health care provider for more information. WEIGHT AND DIET  Eat a healthy diet  Be sure to include plenty of vegetables, fruits, low-fat dairy products, and lean protein.  Do not eat a lot of foods high in solid fats, added sugars, or salt.  Get regular exercise. This is one of the most important things you can do for your health.  Most adults should exercise for at least 150 minutes each week. The exercise should increase your heart rate and make you sweat (moderate-intensity exercise).  Most adults should also do strengthening exercises at least twice a week. This is in addition to the moderate-intensity exercise.  Maintain a healthy weight  Body mass index (BMI) is a measurement that can be used to identify possible weight problems. It estimates body fat based on height and weight. Your health care provider can help determine your BMI and help you achieve or maintain a healthy weight.  For females 66 years of age and older:   A BMI below 18.5 is considered underweight.  A BMI of 18.5 to 24.9 is normal.  A BMI of 25 to 29.9 is considered overweight.  A BMI of 30 and above is considered  obese.  Watch levels of cholesterol and blood lipids  You should start having your blood tested for lipids and cholesterol at 66 years of age, then have this test every 5 years.  You may need to have your cholesterol levels checked more often if:  Your lipid or cholesterol levels are high.  You are older than 66 years of age.  You are at high risk for heart disease.  CANCER SCREENING   Lung Cancer  Lung cancer screening is recommended for adults 21-67 years old who are at high risk for lung cancer because of a history of smoking.  A yearly low-dose CT scan of the lungs is recommended for people who:  Currently smoke.  Have quit within the past 15 years.  Have at least a 30-pack-year history of smoking. A pack year is smoking an average of one pack of cigarettes a day for 1 year.  Yearly screening should continue until it has been 15 years since you quit.  Yearly screening should stop if you develop a health problem that would prevent you from having lung cancer treatment.  Breast Cancer  Practice breast self-awareness. This means understanding how your breasts normally appear and feel.  It also means doing regular breast self-exams. Let your health care provider know about any changes, no matter how small.  If you are in your 20s or 30s, you should have a clinical breast exam (CBE) by a health care provider every 1-3 years as  part of a regular health exam.  If you are 40 or older, have a CBE every year. Also consider having a breast X-ray (mammogram) every year.  If you have a family history of breast cancer, talk to your health care provider about genetic screening.  If you are at high risk for breast cancer, talk to your health care provider about having an MRI and a mammogram every year.  Breast cancer gene (BRCA) assessment is recommended for women who have family members with BRCA-related cancers. BRCA-related cancers  include:  Breast.  Ovarian.  Tubal.  Peritoneal cancers.  Results of the assessment will determine the need for genetic counseling and BRCA1 and BRCA2 testing. Cervical Cancer Your health care provider may recommend that you be screened regularly for cancer of the pelvic organs (ovaries, uterus, and vagina). This screening involves a pelvic examination, including checking for microscopic changes to the surface of your cervix (Pap test). You may be encouraged to have this screening done every 3 years, beginning at age 21.  For women ages 30-65, health care providers may recommend pelvic exams and Pap testing every 3 years, or they may recommend the Pap and pelvic exam, combined with testing for human papilloma virus (HPV), every 5 years. Some types of HPV increase your risk of cervical cancer. Testing for HPV may also be done on women of any age with unclear Pap test results.  Other health care providers may not recommend any screening for nonpregnant women who are considered low risk for pelvic cancer and who do not have symptoms. Ask your health care provider if a screening pelvic exam is right for you.  If you have had past treatment for cervical cancer or a condition that could lead to cancer, you need Pap tests and screening for cancer for at least 20 years after your treatment. If Pap tests have been discontinued, your risk factors (such as having a new sexual partner) need to be reassessed to determine if screening should resume. Some women have medical problems that increase the chance of getting cervical cancer. In these cases, your health care provider may recommend more frequent screening and Pap tests. Colorectal Cancer  This type of cancer can be detected and often prevented.  Routine colorectal cancer screening usually begins at 66 years of age and continues through 66 years of age.  Your health care provider may recommend screening at an earlier age if you have risk factors for  colon cancer.  Your health care provider may also recommend using home test kits to check for hidden blood in the stool.  A small camera at the end of a tube can be used to examine your colon directly (sigmoidoscopy or colonoscopy). This is done to check for the earliest forms of colorectal cancer.  Routine screening usually begins at age 50.  Direct examination of the colon should be repeated every 5-10 years through 66 years of age. However, you may need to be screened more often if early forms of precancerous polyps or small growths are found. Skin Cancer  Check your skin from head to toe regularly.  Tell your health care provider about any new moles or changes in moles, especially if there is a change in a mole's shape or color.  Also tell your health care provider if you have a mole that is larger than the size of a pencil eraser.  Always use sunscreen. Apply sunscreen liberally and repeatedly throughout the day.  Protect yourself by wearing long sleeves, pants,   a wide-brimmed hat, and sunglasses whenever you are outside. HEART DISEASE, DIABETES, AND HIGH BLOOD PRESSURE   High blood pressure causes heart disease and increases the risk of stroke. High blood pressure is more likely to develop in:  People who have blood pressure in the high end of the normal range (130-139/85-89 mm Hg).  People who are overweight or obese.  People who are African American.  If you are 18-39 years of age, have your blood pressure checked every 3-5 years. If you are 40 years of age or older, have your blood pressure checked every year. You should have your blood pressure measured twice--once when you are at a hospital or clinic, and once when you are not at a hospital or clinic. Record the average of the two measurements. To check your blood pressure when you are not at a hospital or clinic, you can use:  An automated blood pressure machine at a pharmacy.  A home blood pressure monitor.  If you  are between 55 years and 79 years old, ask your health care provider if you should take aspirin to prevent strokes.  Have regular diabetes screenings. This involves taking a blood sample to check your fasting blood sugar level.  If you are at a normal weight and have a low risk for diabetes, have this test once every three years after 66 years of age.  If you are overweight and have a high risk for diabetes, consider being tested at a younger age or more often. PREVENTING INFECTION  Hepatitis B  If you have a higher risk for hepatitis B, you should be screened for this virus. You are considered at high risk for hepatitis B if:  You were born in a country where hepatitis B is common. Ask your health care provider which countries are considered high risk.  Your parents were born in a high-risk country, and you have not been immunized against hepatitis B (hepatitis B vaccine).  You have HIV or AIDS.  You use needles to inject street drugs.  You live with someone who has hepatitis B.  You have had sex with someone who has hepatitis B.  You get hemodialysis treatment.  You take certain medicines for conditions, including cancer, organ transplantation, and autoimmune conditions. Hepatitis C  Blood testing is recommended for:  Everyone born from 1945 through 1965.  Anyone with known risk factors for hepatitis C. Sexually transmitted infections (STIs)  You should be screened for sexually transmitted infections (STIs) including gonorrhea and chlamydia if:  You are sexually active and are younger than 66 years of age.  You are older than 66 years of age and your health care provider tells you that you are at risk for this type of infection.  Your sexual activity has changed since you were last screened and you are at an increased risk for chlamydia or gonorrhea. Ask your health care provider if you are at risk.  If you do not have HIV, but are at risk, it may be recommended that you  take a prescription medicine daily to prevent HIV infection. This is called pre-exposure prophylaxis (PrEP). You are considered at risk if:  You are sexually active and do not regularly use condoms or know the HIV status of your partner(s).  You take drugs by injection.  You are sexually active with a partner who has HIV. Talk with your health care provider about whether you are at high risk of being infected with HIV. If you choose to begin   PrEP, you should first be tested for HIV. You should then be tested every 3 months for as long as you are taking PrEP.  PREGNANCY   If you are premenopausal and you may become pregnant, ask your health care provider about preconception counseling.  If you may become pregnant, take 400 to 800 micrograms (mcg) of folic acid every day.  If you want to prevent pregnancy, talk to your health care provider about birth control (contraception). OSTEOPOROSIS AND MENOPAUSE   Osteoporosis is a disease in which the bones lose minerals and strength with aging. This can result in serious bone fractures. Your risk for osteoporosis can be identified using a bone density scan.  If you are 41 years of age or older, or if you are at risk for osteoporosis and fractures, ask your health care provider if you should be screened.  Ask your health care provider whether you should take a calcium or vitamin D supplement to lower your risk for osteoporosis.  Menopause may have certain physical symptoms and risks.  Hormone replacement therapy may reduce some of these symptoms and risks. Talk to your health care provider about whether hormone replacement therapy is right for you.  HOME CARE INSTRUCTIONS   Schedule regular health, dental, and eye exams.  Stay current with your immunizations.   Do not use any tobacco products including cigarettes, chewing tobacco, or electronic cigarettes.  If you are pregnant, do not drink alcohol.  If you are breastfeeding, limit how  much and how often you drink alcohol.  Limit alcohol intake to no more than 1 drink per day for nonpregnant women. One drink equals 12 ounces of beer, 5 ounces of wine, or 1 ounces of hard liquor.  Do not use street drugs.  Do not share needles.  Ask your health care provider for help if you need support or information about quitting drugs.  Tell your health care provider if you often feel depressed.  Tell your health care provider if you have ever been abused or do not feel safe at home.   This information is not intended to replace advice given to you by your health care provider. Make sure you discuss any questions you have with your health care provider.   Document Released: 04/25/2011 Document Revised: 10/31/2014 Document Reviewed: 09/11/2013 Elsevier Interactive Patient Education [employer].

## 2015-10-25 HISTORY — PX: COLONOSCOPY: SHX174

## 2015-11-16 ENCOUNTER — Other Ambulatory Visit: Payer: Self-pay | Admitting: Internal Medicine

## 2015-11-16 DIAGNOSIS — L578 Other skin changes due to chronic exposure to nonionizing radiation: Secondary | ICD-10-CM | POA: Diagnosis not present

## 2015-11-16 NOTE — Telephone Encounter (Signed)
Received refill request electronically See allergy/contraindication Is it okay to refill?

## 2015-11-17 NOTE — Telephone Encounter (Signed)
Allergies and history of htn management  reviewed,  There is no ACE Inhibitor allergy listed .  Refilled.

## 2015-12-23 ENCOUNTER — Other Ambulatory Visit: Payer: Self-pay | Admitting: Internal Medicine

## 2016-02-24 DIAGNOSIS — J301 Allergic rhinitis due to pollen: Secondary | ICD-10-CM | POA: Diagnosis not present

## 2016-02-24 DIAGNOSIS — J329 Chronic sinusitis, unspecified: Secondary | ICD-10-CM | POA: Diagnosis not present

## 2016-03-01 ENCOUNTER — Ambulatory Visit: Payer: PRIVATE HEALTH INSURANCE | Admitting: Internal Medicine

## 2016-03-15 ENCOUNTER — Other Ambulatory Visit: Payer: Self-pay | Admitting: Internal Medicine

## 2016-03-15 ENCOUNTER — Ambulatory Visit
Admission: RE | Admit: 2016-03-15 | Discharge: 2016-03-15 | Disposition: A | Discharge status: Home / Self Care | Payer: Medicare Other | Source: Ambulatory Visit | Attending: Internal Medicine | Admitting: Internal Medicine

## 2016-03-15 DIAGNOSIS — Z1239 Encounter for other screening for malignant neoplasm of breast: Secondary | ICD-10-CM

## 2016-03-15 DIAGNOSIS — Z1231 Encounter for screening mammogram for malignant neoplasm of breast: Secondary | ICD-10-CM | POA: Insufficient documentation

## 2016-03-16 ENCOUNTER — Ambulatory Visit (INDEPENDENT_AMBULATORY_CARE_PROVIDER_SITE_OTHER): Payer: Medicare Other | Admitting: Internal Medicine

## 2016-03-16 ENCOUNTER — Encounter: Payer: Self-pay | Admitting: Internal Medicine

## 2016-03-16 VITALS — BP 144/70 | HR 68 | Temp 98.2°F | Resp 12 | Ht 62.0 in | Wt 135.5 lb

## 2016-03-16 DIAGNOSIS — Z8709 Personal history of other diseases of the respiratory system: Secondary | ICD-10-CM | POA: Diagnosis not present

## 2016-03-16 DIAGNOSIS — E785 Hyperlipidemia, unspecified: Secondary | ICD-10-CM | POA: Diagnosis not present

## 2016-03-16 DIAGNOSIS — Z803 Family history of malignant neoplasm of breast: Secondary | ICD-10-CM

## 2016-03-16 DIAGNOSIS — I10 Essential (primary) hypertension: Secondary | ICD-10-CM

## 2016-03-16 NOTE — Assessment & Plan Note (Addendum)
Untreated due to memory loss with statin use which subjectively improved with cessation.  She will return for fasting labs.   Lab Results  Component Value Date   CHOL 202* 10/07/2015   HDL 56.10 10/07/2015   LDLCALC 133* 10/07/2015   LDLDIRECT 181.4 09/26/2013   TRIG 65.0 10/07/2015   CHOLHDL 4 10/07/2015

## 2016-03-16 NOTE — Progress Notes (Signed)
Pre-visit discussion using our clinic review tool. No additional management support is needed unless otherwise documented below in the visit note.  

## 2016-03-16 NOTE — Assessment & Plan Note (Signed)
Elevated today,  May be due to emotional stress  aksked  to check BP at home  Will increase lisinopril to 10 mg daily if elevated at home.

## 2016-03-16 NOTE — Progress Notes (Signed)
Subjective:  Patient ID: [name], female    DOB: 11-15-1950  Age: 65 y.o. MRN: [id number]  CC: The primary encounter diagnosis was Essential hypertension, benign. Diagnoses of Hyperlipidemia with target LDL less than 130, Family history of breast cancer, and H/O acute sinusitis were also pertinent to this visit.  HPI [name] presents for follow up on hypertension , hyperlipidemia, and other issues.   Was treated with cefdinir and prednisone on May 3 for symptoms of sinusitis and otitis by ENT for 5 of 10 days  But stopped the antibiotics early due to diarrhea .Diarrhea resolved .  Has been drinking Kombucha and taking a probiotic .  Some loose stools due to eating black grapes i  BP elevated. She attributes the elevation to incresaed emotional stress worrying about her son who has graduated from dental school     Outpatient Prescriptions Prior to Visit  Medication Sig Dispense Refill  . Azelastine-Fluticasone (DYMISTA) 137-50 MCG/ACT SUSP Place 2 Squirts into the nose daily. In each nostril 23 g 11  . Calcium Carbonate (CALTRATE 600 PO) Take 1 tablet by mouth daily. Reported on 10/07/2015    . famotidine (PEPCID) 20 MG tablet TAKE ONE TABLET BY MOUTH TWICE DAILY 180 tablet 3  . lisinopril (PRINIVIL,ZESTRIL) 5 MG tablet TAKE ONE TABLET BY MOUTH ONCE DAILY 90 tablet 1  . Multiple Vitamins-Minerals (MULTIVITAMIN WITH MINERALS) tablet Take 1 tablet by mouth daily.    Marland Kitchen doxycycline (VIBRAMYCIN) 100 MG capsule Take 1 capsule (100 mg total) by mouth 2 (two) times daily. (Patient not taking: Reported on 03/16/2016) 20 capsule 0  . mupirocin ointment (BACTROBAN) 2 % Place 1 application into the nose 2 (two) times daily. As needed (Patient not taking: Reported on 03/16/2016) 22 g 5   No facility-administered medications prior to visit.    Review of Systems;  Patient denies headache, fevers, malaise, unintentional weight loss, skin rash, eye pain, sinus congestion and sinus pain, sore  throat, dysphagia,  hemoptysis , cough, dyspnea, wheezing, chest pain, palpitations, orthopnea, edema, abdominal pain, nausea, melena, diarrhea, constipation, flank pain, dysuria, hematuria, urinary  Frequency, nocturia, numbness, tingling, seizures,  Focal weakness, Loss of consciousness,  Tremor, insomnia, depression, anxiety, and suicidal ideation.      Objective:  BP 144/70 mmHg  Pulse 68  Temp(Src) 98.2 F (36.8 C) (Oral)  Resp 12  Ht 5\' 2"  (1.575 m)  Wt 135 lb 8 oz (61.462 kg)  BMI 24.78 kg/m2  SpO2 98%  BP Readings from Last 3 Encounters:  03/16/16 144/70  10/15/15 124/68  10/07/15 122/74    Wt Readings from Last 3 Encounters:  03/16/16 135 lb 8 oz (61.462 kg)  10/15/15 132 lb (59.875 kg)  10/07/15 128 lb 12 oz (58.401 kg)    General appearance: alert, cooperative and appears stated age Ears: normal TM's and external ear canals both ears Throat: lips, mucosa, and tongue normal; teeth and gums normal Neck: no adenopathy, no carotid bruit, supple, symmetrical, trachea midline and thyroid not enlarged, symmetric, no tenderness/mass/nodules Back: symmetric, no curvature. ROM normal. No CVA tenderness. Lungs: clear to auscultation bilaterally Heart: regular rate and rhythm, S1, S2 normal, no murmur, click, rub or gallop Abdomen: soft, non-tender; bowel sounds normal; no masses,  no organomegaly Pulses: 2+ and symmetric Skin: Skin color, texture, turgor normal. No rashes or lesions Lymph nodes: Cervical, supraclavicular, and axillary nodes normal.  No results found for: HGBA1C  Lab Results  Component Value Date   CREATININE 0.70 08/10/2015  CREATININE 0.71 04/01/2015   CREATININE 0.8 10/08/2014    Lab Results  Component Value Date   WBC 3.9* 10/07/2015   HGB 13.5 10/07/2015   HCT 40.1 10/07/2015   PLT 171.0 10/07/2015   GLUCOSE 85 08/10/2015   CHOL 202* 10/07/2015   TRIG 65.0 10/07/2015   HDL 56.10 10/07/2015   LDLDIRECT 181.4 09/26/2013   LDLCALC 133*  10/07/2015   ALT 13 08/10/2015   AST 15 08/10/2015   NA 140 08/10/2015   K 3.9 08/10/2015   CL 104 08/10/2015   CREATININE 0.70 08/10/2015   BUN 18 08/10/2015   CO2 28 08/10/2015   TSH 3.120 10/07/2015    Mm Digital Screening Bilateral  03/16/2016  CLINICAL DATA:  Screening. EXAM: DIGITAL SCREENING BILATERAL MAMMOGRAM WITH CAD COMPARISON:  Previous exam(s). ACR Breast Density Category b: There are scattered areas of fibroglandular density. FINDINGS: There are no findings suspicious for malignancy. Images were processed with CAD. IMPRESSION: No mammographic evidence of malignancy. A result letter of this screening mammogram will be mailed directly to the patient. RECOMMENDATION: Screening mammogram in one year. (Code:SM-B-01Y) BI-RADS CATEGORY  1: Negative. Electronically Signed   By: Franki Cabot M.D.   On: 03/16/2016 08:11    Assessment & Plan:   Problem List Items Addressed This Visit    Hyperlipidemia with target LDL less than 130    Untreated due to memory loss with statin use which subjectively improved with cessation.  She will return for fasting labs.   Lab Results  Component Value Date   CHOL 202* 10/07/2015   HDL 56.10 10/07/2015   LDLCALC 133* 10/07/2015   LDLDIRECT 181.4 09/26/2013   TRIG 65.0 10/07/2015   CHOLHDL 4 10/07/2015         Relevant Orders   Lipid panel   Family history of breast cancer    She was screened with annual mammogram but denied the 3D view by an employee of [employer] .        H/O acute sinusitis   Essential hypertension, benign - Primary    Elevated today,  May be due to emotional stress  aksked  to check BP at home  Will increase lisinopril to 10 mg daily if elevated at home.       Relevant Orders   Comprehensive metabolic panel    A total of 25 minutes of face to face time was spent with patient more than half of which was spent in counselling about the above mentioned conditions  and coordination of care   I am having [name]  maintain her Azelastine-Fluticasone, Calcium Carbonate (CALTRATE 600 PO), multivitamin with minerals, mupirocin ointment, doxycycline, lisinopril, famotidine, and vitamin E.  Meds ordered this encounter  Medications  . vitamin E (VITAMIN E) 1000 UNIT capsule    Sig: Take 2,000 Units by mouth daily.    There are no discontinued medications.  Follow-up: Return for fasting labs June 7.   [name], MD

## 2016-03-16 NOTE — Patient Instructions (Addendum)
Your blood pressure was elevated today (150/78 left arm,   150/84 right arm)  Please check it a few times over the next 2 weeks and send me the readings  If your readings at home are < 140/80,  We'll have you come in WITH your home machine to make sure it's accurate  If they are all > 140/80, I will increase the lisinopril to 10 mg daily     You should try [facility]'s Sinus rinse ;  It is a stong sinus "flush" using water and medicated salts.  Do it over the sink because it can be a bit messy. Use this AFTER YOU HAVE BEEN OUT IN THE YARD to flush out pollen,  Etc.  Return for fasting labs  On June 7 (gives [place] time to decide on a medication adjustment )    [email or web address]

## 2016-03-18 DIAGNOSIS — Z8709 Personal history of other diseases of the respiratory system: Secondary | ICD-10-CM | POA: Insufficient documentation

## 2016-03-18 NOTE — Assessment & Plan Note (Signed)
She was screened with annual mammogram but denied the 3D view by an employee of [employer] .

## 2016-03-18 NOTE — Assessment & Plan Note (Signed)
Treated in early may with an abbreviated course of antibiotics. Exam is normal

## 2016-03-30 ENCOUNTER — Other Ambulatory Visit (INDEPENDENT_AMBULATORY_CARE_PROVIDER_SITE_OTHER): Payer: Medicare Other

## 2016-03-30 DIAGNOSIS — I10 Essential (primary) hypertension: Secondary | ICD-10-CM | POA: Diagnosis not present

## 2016-03-30 DIAGNOSIS — E785 Hyperlipidemia, unspecified: Secondary | ICD-10-CM

## 2016-03-30 LAB — COMPREHENSIVE METABOLIC PANEL
ALT: 18 U/L (ref 0–35)
AST: 20 U/L (ref 0–37)
Albumin: 4.2 g/dL (ref 3.5–5.2)
Alkaline Phosphatase: 47 U/L (ref 39–117)
BUN: 16 mg/dL (ref 6–23)
CHLORIDE: 104 meq/L (ref 96–112)
CO2: 29 mEq/L (ref 19–32)
CREATININE: 0.73 mg/dL (ref 0.40–1.20)
Calcium: 9.5 mg/dL (ref 8.4–10.5)
GFR: 84.56 mL/min (ref >60.00)
GLUCOSE: 98 mg/dL (ref 70–99)
POTASSIUM: 4.2 meq/L (ref 3.5–5.1)
SODIUM: 139 meq/L (ref 135–145)
TOTAL PROTEIN: 6.8 g/dL (ref 6.0–8.3)
Total Bilirubin: 0.5 mg/dL (ref 0.2–1.2)

## 2016-03-30 LAB — LIPID PANEL
Cholesterol: 236 mg/dL — ABNORMAL HIGH (ref 0–200)
HDL: 56.9 mg/dL (ref >39.00)
LDL CALC: 159 mg/dL — AB (ref 0–99)
NONHDL: 178.7
Total CHOL/HDL Ratio: 4
Triglycerides: 101 mg/dL (ref 0.0–149.0)
VLDL: 20.2 mg/dL (ref 0.0–40.0)

## 2016-04-01 ENCOUNTER — Encounter: Payer: Self-pay | Admitting: Internal Medicine

## 2016-05-03 ENCOUNTER — Other Ambulatory Visit: Payer: Self-pay | Admitting: Internal Medicine

## 2016-08-15 ENCOUNTER — Ambulatory Visit (INDEPENDENT_AMBULATORY_CARE_PROVIDER_SITE_OTHER): Payer: Medicare Other

## 2016-08-15 DIAGNOSIS — Z23 Encounter for immunization: Secondary | ICD-10-CM

## 2016-08-15 NOTE — Progress Notes (Signed)
Patient came in for flu shot

## 2016-10-07 ENCOUNTER — Telehealth: Payer: Self-pay | Admitting: Internal Medicine

## 2016-10-07 MED ORDER — LEVOFLOXACIN 500 MG PO TABS: 500.0000 mg | ORAL_TABLET | Freq: Every day | ORAL | 0 refills | Status: DC

## 2016-10-07 NOTE — Telephone Encounter (Signed)
FYI

## 2016-10-07 NOTE — Telephone Encounter (Signed)
Pt husband called and stated that he sent Dr. Derrel Nip a my chart message in regards to his wife. He had not received an e-mail back and just wanted her to know that they are going to start doxycycline for pt. If any questions call [phone]

## 2016-10-07 NOTE — Telephone Encounter (Signed)
Patient husband has been notified. Medication was sent to [facility], [place]

## 2016-10-07 NOTE — Telephone Encounter (Signed)
Tell him the levaqjin 500 mg daily x 7 would be a better choice.   Sent it to [facility] in [place] per request

## 2016-10-12 ENCOUNTER — Ambulatory Visit (INDEPENDENT_AMBULATORY_CARE_PROVIDER_SITE_OTHER): Payer: Medicare Other

## 2016-10-12 ENCOUNTER — Telehealth: Payer: Self-pay | Admitting: Internal Medicine

## 2016-10-12 VITALS — BP 110/70 | HR 77 | Temp 98.1°F | Resp 12 | Ht 61.5 in | Wt 131.8 lb

## 2016-10-12 DIAGNOSIS — Z0000 Encounter for general adult medical examination without abnormal findings: Secondary | ICD-10-CM

## 2016-10-12 NOTE — Progress Notes (Signed)
Subjective:   [name] is a 65 y.o. female who presents for Medicare Annual (Subsequent) preventive examination.  Review of Systems:  No ROS.  Medicare Wellness Visit. Cardiac Risk Factors include: advanced age (>95men, >71 women);hypertension     Objective:     Vitals: BP 110/70 (BP Location: Left Arm, Patient Position: Sitting, Cuff Size: Normal)   Pulse 77   Temp 98.1 F (36.7 C) (Oral)   Resp 12   Ht 5' 1.5" (1.562 m)   Wt 131 lb 12.8 oz (59.8 kg)   SpO2 98%   BMI 24.50 kg/m   Body mass index is 24.5 kg/m.   Tobacco History  Smoking Status  . Never Smoker  Smokeless Tobacco  . Never Used     Counseling given: Not Answered   Past Medical History:  Diagnosis Date  . Adenomatous polyps 2010   colon  . Allergy   . Arthritis   . Chicken pox   . GERD (gastroesophageal reflux disease)   . Hyperlipidemia   . Hypertension   . Pneumonia    Past Surgical History:  Procedure Laterality Date  . SPINE SURGERY     Family History  Problem Relation Age of Onset  . Cancer Mother   . Stroke Mother   . Heart disease Mother   . Hypertension Mother   . Diabetes Mother   . Breast cancer Mother 47  . Heart disease Father   . Hypertension Father    History  Sexual Activity  . Sexual activity: Yes    Outpatient Encounter Prescriptions as of 10/12/2016  Medication Sig  . Azelastine-Fluticasone (DYMISTA) 137-50 MCG/ACT SUSP Place 2 Squirts into the nose daily. In each nostril  . Calcium Carbonate (CALTRATE 600 PO) Take 1 tablet by mouth daily. Reported on 10/07/2015  . famotidine (PEPCID) 20 MG tablet TAKE ONE TABLET BY MOUTH TWICE DAILY  . lisinopril (PRINIVIL,ZESTRIL) 5 MG tablet TAKE ONE TABLET BY MOUTH ONCE DAILY  . Multiple Vitamins-Minerals (MULTIVITAMIN WITH MINERALS) tablet Take 1 tablet by mouth daily.  . vitamin E (VITAMIN E) 1000 UNIT capsule Take 2,000 Units by mouth daily.  . [DISCONTINUED] doxycycline (VIBRAMYCIN) 100 MG capsule Take 1 capsule  (100 mg total) by mouth 2 (two) times daily.  . [DISCONTINUED] levofloxacin (LEVAQUIN) 500 MG tablet Take 1 tablet (500 mg total) by mouth daily.  . mupirocin ointment (BACTROBAN) 2 % Place 1 application into the nose 2 (two) times daily. As needed (Patient not taking: Reported on 10/12/2016)   No facility-administered encounter medications on file as of 10/12/2016.     Activities of Daily Living In your present state of health, do you have any difficulty performing the following activities: 10/12/2016 10/15/2015  Hearing? Y N  Vision? N N  Difficulty concentrating or making decisions? N N  Walking or climbing stairs? N N  Dressing or bathing? N N  Doing errands, shopping? N N  Preparing Food and eating ? N N  Using the Toilet? N N  In the past six months, have you accidently leaked urine? N N  Do you have problems with loss of bowel control? N N  Managing your Medications? N N  Managing your Finances? N N  Housekeeping or managing your Housekeeping? N N  Some recent data might be hidden    Patient Care Team: [name], MD as PCP - General (Internal Medicine)    Assessment:    This is a routine wellness examination for Cailey. The goal of  the wellness visit is to assist the patient how to close the gaps in care and create a preventative care plan for the patient.   Taking calcium as appropriate/Osteoporosis risk reviewed.  Medications reviewed; taking without issues or barriers.  Safety issues reviewed; lives with husband.  Smoke detectors in the home. No firearms in the home. Wears seatbelts when driving or riding with others. No violence in the home.  No identified risk were noted; The patient was oriented x 3; appropriate in dress and manner and no objective failures at ADL's or IADL's.   BMI; discussed the importance of a healthy diet, water intake and exercise. Educational material provided.  HTN; followed by PCP.  Health maintenance gaps;  closed.  Patient Concerns: None at this time. Follow up with PCP as needed.  Exercise Activities and Dietary recommendations Current Exercise Habits: Home exercise routine, Type of exercise: calisthenics;treadmill;stretching, Time (Minutes): 45, Frequency (Times/Week): 1, Weekly Exercise (Minutes/Week): 45, Intensity: Intense  Goals    . Healthy lifestyle          Stay active and continue exercise regimen Stay hydrated and drink plenty of water Low carb foods. Lean meats, vegetables       Fall Risk Fall Risk  10/12/2016 10/15/2015 10/01/2014  Falls in the past year? No No No   Depression Screen PHQ 2/9 Scores 10/12/2016 10/15/2015 10/01/2014  PHQ - 2 Score 0 0 0     Cognitive Function MMSE - Mini Mental State Exam 10/15/2015  Orientation to time 5  Orientation to Place 5  Registration 3  Attention/ Calculation 5  Recall 3  Language- name 2 objects 2  Language- repeat 1  Language- follow 3 step command 3  Language- read & follow direction 1  Write a sentence 1  Copy design 1  Total score 30     6CIT Screen 10/12/2016  What Year? 0 points  What month? 0 points  What time? 0 points  Count back from 20 0 points  Months in reverse 0 points    Immunization History  Administered Date(s) Administered  . Influenza Split 07/26/2012, 08/27/2013  . Influenza, High Dose Seasonal PF 08/25/2015, 08/15/2016  . Influenza-Unspecified 08/29/2014  . Pneumococcal Conjugate-13 09/02/2014  . Pneumococcal Polysaccharide-23 06/27/2007, 10/07/2015  . Tdap 12/25/2010  . Zoster 09/26/2013   Screening Tests Health Maintenance  Topic Date Due  . MAMMOGRAM  03/15/2017  . TETANUS/TDAP  12/24/2020  . COLONOSCOPY  01/24/2022  . INFLUENZA VACCINE  Completed  . DEXA SCAN  Completed  . ZOSTAVAX  Completed  . Hepatitis C Screening  Completed  . PNA vac Low Risk Adult  Completed      Plan:   End of life planning; Advance aging; Advanced directives discussed. Copy of current  HCPOA/Living Will requested.  Medicare Attestation I have personally reviewed: The patient's medical and social history Their use of alcohol, tobacco or illicit drugs Their current medications and supplements The patient's functional ability including ADLs,fall risks, home safety risks, cognitive, and hearing and visual impairment Diet and physical activities Evidence for depression   The patient's weight, height, BMI, and visual acuity have been recorded in the chart.  I have made referrals and provided education to the patient based on review of the above and I have provided the patient with a written personalized care plan for preventive services.    During the course of the visit the patient was educated and counseled about the following appropriate screening and preventive services:   Vaccines to  include Pneumoccal, Influenza, Hepatitis B, Td, Zostavax, HCV  Electrocardiogram  Cardiovascular Disease  Colorectal cancer screening  Bone density screening  Diabetes screening  Glaucoma screening  Mammography/PAP  Nutrition counseling   Patient Instructions (the written plan) was given to the patient.   Varney Biles, LPN  [phone]

## 2016-10-12 NOTE — Patient Instructions (Addendum)
  Ms. Bigg , Thank you for taking time to come for your Medicare Wellness Visit. I appreciate your ongoing commitment to your health goals. Please review the following plan we discussed and let me know if I can assist you in the future.   Make a follow up appointment with Dr. Theodis Shove Christmas and Happy New Year!!  These are the goals we discussed: Goals    . Healthy lifestyle          Stay active and continue exercise regimen Stay hydrated and drink plenty of water Low carb foods. Lean meats, vegetables        This is a list of the screening recommended for you and due dates:  Health Maintenance  Topic Date Due  . Mammogram  03/15/2017  . Tetanus Vaccine  12/24/2020  . Colon Cancer Screening  01/24/2022  . Flu Shot  Completed  . DEXA scan (bone density measurement)  Completed  . Shingles Vaccine  Completed  .  Hepatitis C: One time screening is recommended by Center for Disease Control  (CDC) for  adults born from [birth year] through 1967.   Completed  . Pneumonia vaccines  Completed

## 2016-10-14 ENCOUNTER — Ambulatory Visit: Payer: PRIVATE HEALTH INSURANCE

## 2016-10-20 NOTE — Progress Notes (Signed)
  I have reviewed the above information and agree with above.   Maritza Goldsborough, MD 

## 2016-11-21 ENCOUNTER — Ambulatory Visit (INDEPENDENT_AMBULATORY_CARE_PROVIDER_SITE_OTHER): Payer: Medicare Other

## 2016-11-21 ENCOUNTER — Encounter: Payer: Self-pay | Admitting: Internal Medicine

## 2016-11-21 ENCOUNTER — Ambulatory Visit (INDEPENDENT_AMBULATORY_CARE_PROVIDER_SITE_OTHER): Payer: Medicare Other | Admitting: Internal Medicine

## 2016-11-21 VITALS — BP 128/76 | HR 95 | Temp 98.4°F | Resp 16 | Ht 62.0 in | Wt 133.0 lb

## 2016-11-21 DIAGNOSIS — I10 Essential (primary) hypertension: Secondary | ICD-10-CM

## 2016-11-21 DIAGNOSIS — R053 Chronic cough: Secondary | ICD-10-CM

## 2016-11-21 DIAGNOSIS — R079 Chest pain, unspecified: Secondary | ICD-10-CM | POA: Diagnosis not present

## 2016-11-21 DIAGNOSIS — R05 Cough: Secondary | ICD-10-CM

## 2016-11-21 DIAGNOSIS — J684 Chronic respiratory conditions due to chemicals, gases, fumes and vapors: Secondary | ICD-10-CM | POA: Diagnosis not present

## 2016-11-21 LAB — CBC WITH DIFFERENTIAL/PLATELET
BASOS PCT: 0.8 % (ref 0.0–3.0)
Basophils Absolute: 0 10*3/uL (ref 0.0–0.1)
EOS ABS: 0.1 10*3/uL (ref 0.0–0.7)
EOS PCT: 1.5 % (ref 0.0–5.0)
HEMATOCRIT: 41.8 % (ref 36.0–46.0)
HEMOGLOBIN: 14.2 g/dL (ref 12.0–15.0)
LYMPHS PCT: 19.8 % (ref 12.0–46.0)
Lymphs Abs: 1.1 10*3/uL (ref 0.7–4.0)
MCHC: 33.8 g/dL (ref 30.0–36.0)
MCV: 93.3 fl (ref 78.0–100.0)
Monocytes Absolute: 0.5 10*3/uL (ref 0.1–1.0)
Monocytes Relative: 8.7 % (ref 3.0–12.0)
Neutro Abs: 4 10*3/uL (ref 1.4–7.7)
Neutrophils Relative %: 69.2 % (ref 43.0–77.0)
PLATELETS: 203 10*3/uL (ref 150.0–400.0)
RBC: 4.48 Mil/uL (ref 3.87–5.11)
RDW: 13.5 % (ref 11.5–15.5)
WBC: 5.7 10*3/uL (ref 4.0–10.5)

## 2016-11-21 LAB — COMPREHENSIVE METABOLIC PANEL
ALBUMIN: 4.4 g/dL (ref 3.5–5.2)
ALT: 19 U/L (ref 0–35)
AST: 20 U/L (ref 0–37)
Alkaline Phosphatase: 61 U/L (ref 39–117)
BUN: 17 mg/dL (ref 6–23)
CALCIUM: 9.6 mg/dL (ref 8.4–10.5)
CHLORIDE: 105 meq/L (ref 96–112)
CO2: 26 meq/L (ref 19–32)
CREATININE: 0.72 mg/dL (ref 0.40–1.20)
GFR: 85.75 mL/min (ref >60.00)
Glucose, Bld: 92 mg/dL (ref 70–99)
POTASSIUM: 4.2 meq/L (ref 3.5–5.1)
Sodium: 138 mEq/L (ref 135–145)
Total Bilirubin: 0.5 mg/dL (ref 0.2–1.2)
Total Protein: 7 g/dL (ref 6.0–8.3)

## 2016-11-21 MED ORDER — BUDESONIDE-FORMOTEROL FUMARATE 80-4.5 MCG/ACT IN AERO: 2.0000 | INHALATION_SPRAY | Freq: Two times a day (BID) | RESPIRATORY_TRACT | 3 refills | Status: DC

## 2016-11-21 NOTE — Progress Notes (Signed)
Pre visit review using our clinic review tool, if applicable. No additional management support is needed unless otherwise documented below in the visit note. 

## 2016-11-21 NOTE — Patient Instructions (Signed)
I recommend using sudafed PE for sinus congestion,  Dymista for allergic rhinitis (instead of nasocort), and symbicort for the chest tightness and asthmatics symptoms while you are in your [employer] home   The labs today are to confirm/rule out an exposure to testable molds that can cause pneumonitis

## 2016-11-21 NOTE — Progress Notes (Signed)
Subjective:  Patient ID: Carollee Massed, female    DOB: 1950/11/24  Age: 66 y.o. MRN: [id number]  CC: The primary encounter diagnosis was Persistent cough for 3 weeks or longer. 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  HPI EMARIE ZIEGER presents for follow up on recurrent chest tightness with cough and sinus congestion that occurs whenever she enters her home. Marland Kitchen  History: [place] home has been under renovation  from August thru November  With incomplete removal of mildew per patient., replete with duct cleaning . House had an attic pipe that  leaked into a linen closet repeatedly during a prolonged vacation in [place], and she returned to find the closet with water damage, wet sheets,  Despite her   best efforts to dehumidify the house which developed mold and mildew and work up with lips and face burning , rash on hands.  Stayed in a hotel for a few nights during the renovation and spraying of germicide (particular brand /chemic unclear) by [employer]  But upon return to the house she developed Chest burning,  Cough, chest tightness  Which has recurred with each entry.  Master bedroom and bath were not renovated. Mildew odor was strong .  Symptoms resolve when she leaves the house and were not present during skiing trip to [place] ,  [facility] and recur whenever she returns home.  Used nasacort and benadryl.  And HEPA filter.   Sinuses pound /pressure when she bends from the waist  Will not take singulair because she was told that it caused her to develop a  community acquired pneumonia back in Nov 2012   .        Outpatient Medications Prior to Visit  Medication Sig Dispense Refill  . Azelastine-Fluticasone (DYMISTA) 137-50 MCG/ACT SUSP Place 2 Squirts into the nose daily. In each nostril 23 g 11  . Calcium Carbonate (CALTRATE 600 PO) Take 1 tablet by mouth daily. Reported on 10/07/2015    .  famotidine (PEPCID) 20 MG tablet TAKE ONE TABLET BY MOUTH TWICE DAILY 180 tablet 3  . lisinopril (PRINIVIL,ZESTRIL) 5 MG tablet TAKE ONE TABLET BY MOUTH ONCE DAILY 90 tablet 3  . Multiple Vitamins-Minerals (MULTIVITAMIN WITH MINERALS) tablet Take 1 tablet by mouth daily.    . mupirocin ointment (BACTROBAN) 2 % Place 1 application into the nose 2 (two) times daily. As needed 22 g 5  . vitamin E (VITAMIN E) 1000 UNIT capsule Take 2,000 Units by mouth daily.     No facility-administered medications prior to visit.     Review of Systems;  Patient denies headache, fevers, malaise, unintentional weight loss, skin rash, eye pain, sinus congestion and sinus pain, sore throat, dysphagia,  hemoptysis , cough, dyspnea, wheezing, chest pain, palpitations, orthopnea, edema, abdominal pain, nausea, melena, diarrhea, constipation, flank pain, dysuria, hematuria, urinary  Frequency, nocturia, numbness, tingling, seizures,  Focal weakness, Loss of consciousness,  Tremor, insomnia, depression, anxiety, and suicidal ideation.      Objective:  BP 128/76   Pulse 95   Temp 98.4 F (36.9 C) (Oral)   Resp 16   Ht 5\' 2"  (1.575 m)   Wt 133 lb (60.3 kg)   SpO2 97%   BMI 24.33 kg/m   BP Readings from Last 3 Encounters:  11/21/16 128/76  10/12/16 110/70  03/16/16 (!) 144/70    Wt Readings from Last 3 Encounters:  11/21/16 133 lb (60.3  kg)  10/12/16 131 lb 12.8 oz (59.8 kg)  03/16/16 135 lb 8 oz (61.5 kg)    General appearance: alert, cooperative and appears stated age Ears: normal TM's and external ear canals both ears Throat: lips, mucosa, and tongue normal; teeth and gums normal Neck: no adenopathy, no carotid bruit, supple, symmetrical, trachea midline and thyroid not enlarged, symmetric, no tenderness/mass/nodules Back: symmetric, no curvature. ROM normal. No CVA tenderness. Lungs: clear to auscultation bilaterally Heart: regular rate and rhythm, S1, S2 normal, no murmur, click, rub or  gallop Abdomen: soft, non-tender; bowel sounds normal; no masses,  no organomegaly Pulses: 2+ and symmetric Skin: Skin color, texture, turgor normal. No rashes or lesions Lymph nodes: Cervical, supraclavicular, and axillary nodes normal.  No results found for: HGBA1C  Lab Results  Component Value Date   CREATININE 0.72 11/21/2016   CREATININE 0.73 03/30/2016   CREATININE 0.70 08/10/2015    Lab Results  Component Value Date   WBC 5.7 11/21/2016   HGB 14.2 11/21/2016   HCT 41.8 11/21/2016   PLT 203.0 11/21/2016   GLUCOSE 92 11/21/2016   CHOL 236 (H) 03/30/2016   TRIG 101.0 03/30/2016   HDL 56.90 03/30/2016   LDLDIRECT 181.4 09/26/2013   LDLCALC 159 (H) 03/30/2016   ALT 19 11/21/2016   AST 20 11/21/2016   NA 138 11/21/2016   K 4.2 11/21/2016   CL 105 11/21/2016   CREATININE 0.72 11/21/2016   BUN 17 11/21/2016   CO2 26 11/21/2016   TSH 3.120 10/07/2015    Mm Digital Screening Bilateral  Result Date: 03/16/2016 CLINICAL DATA:  Screening. EXAM: DIGITAL SCREENING BILATERAL MAMMOGRAM WITH CAD COMPARISON:  Previous exam(s). ACR Breast Density Category b: There are scattered areas of fibroglandular density. FINDINGS: There are no findings suspicious for malignancy. Images were processed with CAD. IMPRESSION: No mammographic evidence of malignancy. A result letter of this screening mammogram will be mailed directly to the patient. RECOMMENDATION: Screening mammogram in one year. (Code:SM-B-01Y) BI-RADS CATEGORY  1: Negative. Electronically Signed   By: Franki Cabot M.D.   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  Advised to resume dymista and symbocort starting several days before returning to home.  Chest x ray and cbc normal.   Hypersensitivity pneumonitis profile drawn.       Other Visit Diagnoses    Persistent cough for 3 weeks or longer    -  Primary   Relevant Orders   CBC with Differential/Platelet (Completed)   DG Chest 2 View (Completed)   Hypersensitivity Pneumonitis   Essential hypertension       Relevant Orders   Comprehensive metabolic panel (Completed)      I am having Ms. Manfredo start on budesonide-formoterol. I am also having her maintain her Azelastine-Fluticasone, Calcium Carbonate (CALTRATE 600 PO), multivitamin with minerals, mupirocin ointment, famotidine, vitamin E, and lisinopril.  Meds ordered this encounter  Medications  . budesonide-formoterol (SYMBICORT) 80-4.5 MCG/ACT inhaler    Sig: Inhale 2 puffs into the lungs 2 (two) times daily.    Dispense:  1 Inhaler    Refill:  3   .A total of 25 minutes of face to face time was spent with patient more than half of which was spent in counselling about  the above mentioned conditions  and coordination of care  There are no discontinued medications.  Follow-up: No Follow-up on file.   [name], MD

## 2016-11-22 ENCOUNTER — Encounter: Payer: Self-pay | Admitting: Internal Medicine

## 2016-11-22 DIAGNOSIS — J684 Chronic respiratory conditions due to chemicals, gases, fumes and vapors: Secondary | ICD-10-CM | POA: Insufficient documentation

## 2016-11-22 NOTE — Assessment & Plan Note (Signed)
Secondary to exposure to mold/mildew followed by cleaning solvents.symptoms resolve with environment change and recur with each trip to [place] home.  Advised to resume dymista and symbocort starting several days before returning to home.  Chest x ray and cbc normal.  Hypersensitivity pneumonitis profile drawn.

## 2016-11-25 LAB — HYPERSENSITIVITY PNEUMONITIS
A. FUMIGATUS #1 ABS: NEGATIVE
A. PULLULANS ABS: NEGATIVE
Micropolyspora faeni, IgG: NEGATIVE
Pigeon Serum Abs: NEGATIVE
THERMOACT. SACCHARII: NEGATIVE
THERMOACTINOMYCES VULGARIS IGG: NEGATIVE

## 2016-11-26 ENCOUNTER — Encounter: Payer: Self-pay | Admitting: Internal Medicine

## 2017-01-30 ENCOUNTER — Other Ambulatory Visit: Payer: Self-pay | Admitting: Internal Medicine

## 2017-01-30 DIAGNOSIS — Z1231 Encounter for screening mammogram for malignant neoplasm of breast: Secondary | ICD-10-CM

## 2017-02-27 ENCOUNTER — Other Ambulatory Visit: Payer: Self-pay | Admitting: Internal Medicine

## 2017-03-17 ENCOUNTER — Ambulatory Visit
Admission: RE | Admit: 2017-03-17 | Discharge: 2017-03-17 | Disposition: A | Discharge status: Home / Self Care | Payer: Medicare Other | Source: Ambulatory Visit | Attending: Internal Medicine | Admitting: Internal Medicine

## 2017-03-17 DIAGNOSIS — Z1231 Encounter for screening mammogram for malignant neoplasm of breast: Secondary | ICD-10-CM | POA: Insufficient documentation

## 2017-03-28 ENCOUNTER — Other Ambulatory Visit: Payer: Self-pay | Admitting: Internal Medicine

## 2017-05-08 DIAGNOSIS — L853 Xerosis cutis: Secondary | ICD-10-CM | POA: Diagnosis not present

## 2017-05-08 DIAGNOSIS — Z711 Person with feared health complaint in whom no diagnosis is made: Secondary | ICD-10-CM | POA: Diagnosis not present

## 2017-05-08 DIAGNOSIS — Z043 Encounter for examination and observation following other accident: Secondary | ICD-10-CM | POA: Diagnosis not present

## 2017-05-15 ENCOUNTER — Encounter: Payer: Self-pay | Admitting: Internal Medicine

## 2017-05-15 ENCOUNTER — Ambulatory Visit (INDEPENDENT_AMBULATORY_CARE_PROVIDER_SITE_OTHER): Payer: Medicare Other | Admitting: Internal Medicine

## 2017-05-15 ENCOUNTER — Telehealth: Payer: Self-pay | Admitting: Internal Medicine

## 2017-05-15 VITALS — BP 148/90 | HR 76 | Temp 98.2°F | Resp 15

## 2017-05-15 DIAGNOSIS — R21 Rash and other nonspecific skin eruption: Secondary | ICD-10-CM | POA: Diagnosis not present

## 2017-05-15 DIAGNOSIS — T7840XA Allergy, unspecified, initial encounter: Secondary | ICD-10-CM

## 2017-05-15 LAB — CBC WITH DIFFERENTIAL/PLATELET
BASOS PCT: 0.8 % (ref 0.0–3.0)
Basophils Absolute: 0 10*3/uL (ref 0.0–0.1)
EOS ABS: 0.2 10*3/uL (ref 0.0–0.7)
Eosinophils Relative: 5 % (ref 0.0–5.0)
HCT: 39.6 % (ref 36.0–46.0)
HEMOGLOBIN: 13.1 g/dL (ref 12.0–15.0)
LYMPHS ABS: 0.7 10*3/uL (ref 0.7–4.0)
Lymphocytes Relative: 17.8 % (ref 12.0–46.0)
MCHC: 33.2 g/dL (ref 30.0–36.0)
MCV: 95.4 fl (ref 78.0–100.0)
MONO ABS: 0.5 10*3/uL (ref 0.1–1.0)
Monocytes Relative: 12.8 % — ABNORMAL HIGH (ref 3.0–12.0)
NEUTROS ABS: 2.4 10*3/uL (ref 1.4–7.7)
NEUTROS PCT: 63.6 % (ref 43.0–77.0)
PLATELETS: 141 10*3/uL — AB (ref 150.0–400.0)
RBC: 4.15 Mil/uL (ref 3.87–5.11)
RDW: 13.4 % (ref 11.5–15.5)
WBC: 3.8 10*3/uL — AB (ref 4.0–10.5)

## 2017-05-15 MED ORDER — PREDNISONE 10 MG PO TABS: ORAL_TABLET | ORAL | 0 refills | Status: DC

## 2017-05-15 MED ORDER — METHYLPREDNISOLONE ACETATE 40 MG/ML IJ SUSP
40.0000 mg | Freq: Once | INTRAMUSCULAR | Status: AC
  Administered 2017-05-15: 40 mg via INTRAMUSCULAR

## 2017-05-15 MED ORDER — DOXYCYCLINE MONOHYDRATE 100 MG PO CAPS: 100.0000 mg | ORAL_CAPSULE | Freq: Two times a day (BID) | ORAL | 1 refills | Status: DC

## 2017-05-15 NOTE — Telephone Encounter (Signed)
Pt requested to have Rx sent to [facility] on [address] , pt no longer use [facility]

## 2017-05-15 NOTE — Telephone Encounter (Signed)
Spoke with the patient, she had a wrist splint on her left wrist for approximately three hours and notice a rash starting there, tookt he splint off and took both Zytrec and has tried benadryl also.  Rash seems to be spreading now to fingertips and now on right arm and torso.  She did try a cortisone cream and that seems to be making it worse. Please advise.  She said this happened prior with a neoprene strap from a camera.

## 2017-05-15 NOTE — Patient Instructions (Signed)
Start the prednisone tomorrow if you have not seen an improvement in the rash overnight  Continue daily zyrtec and nightly benadryl until rash has resolved

## 2017-05-15 NOTE — Telephone Encounter (Signed)
I will send in a prednisone taper to her local [facility] for her to start .Marland Kitchen

## 2017-05-15 NOTE — Telephone Encounter (Signed)
Pt called and left a voicemail stating that she broke out with contact dermatitis on Saturday on her left wrist and it is red and swollen and not it has spread to her fingertips, rt arm, and torso. Pt states that she has been taking zytrec and benadryl both once a day. Please advise, thank you!  Call pt @ [phone]

## 2017-05-15 NOTE — Telephone Encounter (Signed)
Resent to other pharmacy.  

## 2017-05-15 NOTE — Telephone Encounter (Signed)
attempted to reach the patient, left a detailed message that a prednisone taper was sent in. thanks

## 2017-05-16 ENCOUNTER — Encounter: Payer: Self-pay | Admitting: Internal Medicine

## 2017-05-16 DIAGNOSIS — R21 Rash and other nonspecific skin eruption: Secondary | ICD-10-CM | POA: Insufficient documentation

## 2017-05-16 DIAGNOSIS — R5383 Other fatigue: Secondary | ICD-10-CM

## 2017-05-16 DIAGNOSIS — R350 Frequency of micturition: Secondary | ICD-10-CM

## 2017-05-16 NOTE — Assessment & Plan Note (Signed)
The rash is pruritic, initially papular, now macular and confluent,  Etiology unclear.  Mild leukopenia and thrombocytopenia on CBC without eosinophilia. She has no fevers or headaches, and no joint pain.  Will treat with steroids,  Start doxycycline if fever or headaches occur.  Lab Results  Component Value Date   WBC 3.8 (L) 05/15/2017   HGB 13.1 05/15/2017   HCT 39.6 05/15/2017   MCV 95.4 05/15/2017   PLT 141.0 (L) 05/15/2017

## 2017-05-16 NOTE — Progress Notes (Signed)
Subjective:  Patient ID: [name], female    DOB: 1950/11/08  Age: 66 y.o. MRN: [id number]  CC: The primary encounter diagnosis was Allergic reaction, initial encounter. A diagnosis of Rash of unknown cause was also pertinent to this visit.  HPI [name] presents for  As a work in  during husbands visit  For evaluation of a diffuse macular blanching rash.    One week ago,  A Trapped wild animal in garage caused flooding in the garage by chewing on a hose,  Thinks she came in contact  With the saliva of the animal because she touched the hose that had been dripping water into the basin that the anemia drank from, but never saw the animal itself   The rash started on Saturday . After using a small amount of voltaren gel on her strained wrist on Saturday.It began as a maculopapular Itchy patch on the dorsal surface of her wrist, underneath her wrist brace.  Other patches have cropped on behind the knees,  In the groin,  And now on her  neck.  Subjective fever but not measured.   She has had recurrent tick exposure.  She finished a ten day course of doxycycline June for signs and symptoms suggestive of tick fever (body aches, fever ) . History of erlichiosis years ago, caused joint pain .   Also helped her daughter with a yard sale on saturday , came into contact with a lot of dusty objects    Outpatient Medications Prior to Visit  Medication Sig Dispense Refill  . Azelastine-Fluticasone (DYMISTA) 137-50 MCG/ACT SUSP Place 2 Squirts into the nose daily. In each nostril 23 g 11  . budesonide-formoterol (SYMBICORT) 80-4.5 MCG/ACT inhaler Inhale 2 puffs into the lungs 2 (two) times daily. 1 Inhaler 3  . Calcium Carbonate (CALTRATE 600 PO) Take 1 tablet by mouth daily. Reported on 10/07/2015    . famotidine (PEPCID) 20 MG tablet TAKE ONE TABLET BY MOUTH TWICE DAILY 180 tablet 3  . lisinopril (PRINIVIL,ZESTRIL) 5 MG tablet TAKE ONE TABLET BY MOUTH ONCE DAILY 90 tablet 0  . Multiple  Vitamins-Minerals (MULTIVITAMIN WITH MINERALS) tablet Take 1 tablet by mouth daily.    . mupirocin ointment (BACTROBAN) 2 % Place 1 application into the nose 2 (two) times daily. As needed 22 g 5  . vitamin E (VITAMIN E) 1000 UNIT capsule Take 2,000 Units by mouth daily.    . predniSONE (DELTASONE) 10 MG tablet 6 tablets on Day 1 , then reduce by 1 tablet daily until gone 21 tablet 0   No facility-administered medications prior to visit.     Review of Systems;  Patient denies headache, fevers, malaise, unintentional weight loss,, eye pain, sinus congestion and sinus pain, sore throat, dysphagia,  hemoptysis , cough, dyspnea, wheezing, chest pain, palpitations, orthopnea, edema, abdominal pain, nausea, melena, diarrhea, constipation, flank pain, dysuria, hematuria, urinary  Frequency, nocturia, numbness, tingling, seizures,  Focal weakness, Loss of consciousness,  Tremor, insomnia, depression, anxiety, and suicidal ideation.      Objective:  BP (!) 148/90 (BP Location: Left Arm, Patient Position: Sitting, Cuff Size: Normal)   Pulse 76   Temp 98.2 F (36.8 C) (Oral)   Resp 15   SpO2 97%   BP Readings from Last 3 Encounters:  05/15/17 (!) 148/90  11/21/16 128/76  10/12/16 110/70    General appearance: alert, cooperative and appears stated age Throat: lips, mucosa, and tongue normal; teeth and gums normal Neck: no adenopathy, no  carotid bruit, supple, symmetrical, trachea midline and thyroid not enlarged, symmetric, no tenderness/mass/nodules Back: symmetric, no curvature. ROM normal. No CVA tenderness. Lungs: clear to auscultation bilaterally Heart: regular rate and rhythm, S1, S2 normal, no murmur, click, rub or gallop Abdomen: soft, non-tender; bowel sounds normal; no masses,  no organomegaly Pulses: 2+ and symmetric Skin: macular rash with distinct borders covering left wrist,  Not extending to palm .  And right side of neck.  Lymph nodes: Cervical, supraclavicular, and axillary  nodes normal.  No results found for: HGBA1C  Lab Results  Component Value Date   CREATININE 0.72 11/21/2016   CREATININE 0.73 03/30/2016   CREATININE 0.70 08/10/2015    Lab Results  Component Value Date   WBC 3.8 (L) 05/15/2017   HGB 13.1 05/15/2017   HCT 39.6 05/15/2017   PLT 141.0 (L) 05/15/2017   GLUCOSE 92 11/21/2016   CHOL 236 (H) 03/30/2016   TRIG 101.0 03/30/2016   HDL 56.90 03/30/2016   LDLDIRECT 181.4 09/26/2013   LDLCALC 159 (H) 03/30/2016   ALT 19 11/21/2016   AST 20 11/21/2016   NA 138 11/21/2016   K 4.2 11/21/2016   CL 105 11/21/2016   CREATININE 0.72 11/21/2016   BUN 17 11/21/2016   CO2 26 11/21/2016   TSH 3.120 10/07/2015    Mm Screening Breast Tomo Bilateral  Result Date: 03/17/2017 CLINICAL DATA:  Screening. EXAM: 2D DIGITAL SCREENING BILATERAL MAMMOGRAM WITH CAD AND ADJUNCT TOMO COMPARISON:  Previous exam(s). ACR Breast Density Category b: There are scattered areas of fibroglandular density. FINDINGS: There are no findings suspicious for malignancy. Images were processed with CAD. IMPRESSION: No mammographic evidence of malignancy. A result letter of this screening mammogram will be mailed directly to the patient. RECOMMENDATION: Screening mammogram in one year. (Code:SM-B-01Y) BI-RADS CATEGORY  1: Negative. Electronically Signed   By: Claudie Revering M.D.   On: 03/17/2017 14:49    Assessment & Plan:   Problem List Items Addressed This Visit    Rash of unknown cause    The rash is pruritic, initially papular, now macular and confluent,  Etiology unclear.  Mild leukopenia and thrombocytopenia on CBC without eosinophilia. She has no fevers or headaches, and no joint pain.  Will treat with steroids,  Start doxycycline if fever or headaches occur.  Lab Results  Component Value Date   WBC 3.8 (L) 05/15/2017   HGB 13.1 05/15/2017   HCT 39.6 05/15/2017   MCV 95.4 05/15/2017   PLT 141.0 (L) 05/15/2017          Other Visit Diagnoses    Allergic reaction,  initial encounter    -  Primary   Relevant Medications   methylPREDNISolone acetate (DEPO-MEDROL) injection 40 mg (Completed)   Other Relevant Orders   CBC with Differential/Platelet (Completed)      I am having Ms. Duchene maintain her Azelastine-Fluticasone, Calcium Carbonate (CALTRATE 600 PO), multivitamin with minerals, mupirocin ointment, vitamin E, budesonide-formoterol, famotidine, lisinopril, doxycycline, and predniSONE. We administered methylPREDNISolone acetate.  Meds ordered this encounter  Medications  . DISCONTD: doxycycline (MONODOX) 100 MG capsule    Sig: Take 1 capsule (100 mg total) by mouth 2 (two) times daily.    Dispense:  20 capsule    Refill:  1  . methylPREDNISolone acetate (DEPO-MEDROL) injection 40 mg  . doxycycline (MONODOX) 100 MG capsule    Sig: Take 1 capsule (100 mg total) by mouth 2 (two) times daily.    Dispense:  20 capsule    Refill:  1  . predniSONE (DELTASONE) 10 MG tablet    Sig: 6 tablets on Day 1 , then reduce by 1 tablet daily until gone    Dispense:  21 tablet    Refill:  0    Medications Discontinued During This Encounter  Medication Reason  . doxycycline (MONODOX) 100 MG capsule Reorder  . predniSONE (DELTASONE) 10 MG tablet Reorder    Follow-up: No Follow-up on file.   [name], MD

## 2017-05-25 NOTE — Telephone Encounter (Signed)
Pt spouse called and looking to have repeat CBC and wanted to add clean catch urine (having frequency). They would like to have done this afternoon or tomorrow morning. Please advise, thank you!  Call pt @ [phone] or [phone]

## 2017-05-25 NOTE — Telephone Encounter (Signed)
Per [employer] message it states that the pt would need to come back in for repeat lab work in 2 weeks. The last lab was drawn on 05/15/2017, only one week ago. Is it ok for pt to come back tomorrow to have these done along with a urine sample? Please advise.

## 2017-05-25 NOTE — Telephone Encounter (Signed)
Spoke with Dr. Derrel Nip verbally and she stated that it is okay for the pt to come in tomorrow to have th repeat cbc and the the ua. The pt has been scheduled for a lab appt and is aware of appt date and time.

## 2017-05-26 ENCOUNTER — Other Ambulatory Visit (INDEPENDENT_AMBULATORY_CARE_PROVIDER_SITE_OTHER): Payer: Medicare Other

## 2017-05-26 DIAGNOSIS — R350 Frequency of micturition: Secondary | ICD-10-CM | POA: Diagnosis not present

## 2017-05-26 DIAGNOSIS — R5383 Other fatigue: Secondary | ICD-10-CM | POA: Diagnosis not present

## 2017-05-26 LAB — CBC WITH DIFFERENTIAL/PLATELET
BASOS PCT: 1 %
Basophils Absolute: 52 cells/uL (ref 0–200)
EOS ABS: 104 {cells}/uL (ref 15–500)
Eosinophils Relative: 2 %
HEMATOCRIT: 40.3 % (ref 35.0–45.0)
Hemoglobin: 13.4 g/dL (ref 11.7–15.5)
Lymphocytes Relative: 27 %
Lymphs Abs: 1404 cells/uL (ref 850–3900)
MCH: 31.3 pg (ref 27.0–33.0)
MCHC: 33.3 g/dL (ref 32.0–36.0)
MCV: 94.2 fL (ref 80.0–100.0)
MONO ABS: 468 {cells}/uL (ref 200–950)
MONOS PCT: 9 %
MPV: 10.1 fL (ref 7.5–12.5)
NEUTROS ABS: 3172 {cells}/uL (ref 1500–7800)
Neutrophils Relative %: 61 %
PLATELETS: 227 10*3/uL (ref 140–400)
RBC: 4.28 MIL/uL (ref 3.80–5.10)
RDW: 13.7 % (ref 11.0–15.0)
WBC: 5.2 10*3/uL (ref 3.8–10.8)

## 2017-05-27 ENCOUNTER — Encounter: Payer: Self-pay | Admitting: Internal Medicine

## 2017-05-27 LAB — URINALYSIS, ROUTINE W REFLEX MICROSCOPIC
Bilirubin Urine: NEGATIVE
GLUCOSE, UA: NEGATIVE
Hgb urine dipstick: NEGATIVE
Ketones, ur: NEGATIVE
Nitrite: NEGATIVE
PROTEIN: NEGATIVE
Specific Gravity, Urine: 1.028 (ref 1.001–1.035)
pH: 5 (ref 5.0–8.0)

## 2017-05-27 LAB — URINALYSIS, MICROSCOPIC ONLY
BACTERIA UA: NONE SEEN [HPF]
Casts: NONE SEEN [LPF]
SQUAMOUS EPITHELIAL / LPF: NONE SEEN [HPF] (ref <=5)
YEAST: NONE SEEN [HPF]

## 2017-05-27 LAB — URINE CULTURE: ORGANISM ID, BACTERIA: NO GROWTH

## 2017-05-28 ENCOUNTER — Encounter: Payer: Self-pay | Admitting: Internal Medicine

## 2017-06-15 ENCOUNTER — Other Ambulatory Visit: Payer: Self-pay | Admitting: Internal Medicine

## 2017-07-24 NOTE — Telephone Encounter (Signed)
error 

## 2017-07-24 NOTE — Telephone Encounter (Signed)
Error

## 2017-07-29 ENCOUNTER — Telehealth: Payer: Self-pay | Admitting: Internal Medicine

## 2017-07-29 DIAGNOSIS — Z86010 Personal history of colonic polyps: Secondary | ICD-10-CM | POA: Insufficient documentation

## 2017-07-29 NOTE — Assessment & Plan Note (Signed)
Found in 2010.  Per patient  2013 colonoscopy by Dr Candace Cruise  was normal,  . ,  5 yr follow up recommended (per patient)

## 2017-08-01 DIAGNOSIS — Z23 Encounter for immunization: Secondary | ICD-10-CM | POA: Diagnosis not present

## 2017-08-03 NOTE — Telephone Encounter (Signed)
Referral

## 2017-09-19 ENCOUNTER — Other Ambulatory Visit: Payer: Self-pay | Admitting: Internal Medicine

## 2017-10-03 ENCOUNTER — Ambulatory Visit: Payer: PRIVATE HEALTH INSURANCE

## 2017-10-05 ENCOUNTER — Ambulatory Visit: Payer: PRIVATE HEALTH INSURANCE

## 2017-10-06 ENCOUNTER — Other Ambulatory Visit: Payer: Self-pay | Admitting: Otolaryngology

## 2017-10-06 DIAGNOSIS — M5481 Occipital neuralgia: Secondary | ICD-10-CM | POA: Diagnosis not present

## 2017-10-06 DIAGNOSIS — R51 Headache: Secondary | ICD-10-CM | POA: Diagnosis not present

## 2017-10-06 DIAGNOSIS — M542 Cervicalgia: Secondary | ICD-10-CM

## 2017-10-09 ENCOUNTER — Other Ambulatory Visit: Payer: Self-pay | Admitting: Otolaryngology

## 2017-10-09 DIAGNOSIS — R202 Paresthesia of skin: Secondary | ICD-10-CM

## 2017-10-09 DIAGNOSIS — M5481 Occipital neuralgia: Secondary | ICD-10-CM

## 2017-10-09 DIAGNOSIS — M542 Cervicalgia: Secondary | ICD-10-CM

## 2017-10-12 ENCOUNTER — Ambulatory Visit: Payer: PRIVATE HEALTH INSURANCE

## 2017-10-18 ENCOUNTER — Ambulatory Visit
Admission: RE | Admit: 2017-10-18 | Discharge: 2017-10-18 | Disposition: A | Discharge status: Home / Self Care | Payer: Medicare Other | Source: Ambulatory Visit | Attending: Otolaryngology | Admitting: Otolaryngology

## 2017-10-18 DIAGNOSIS — R202 Paresthesia of skin: Secondary | ICD-10-CM

## 2017-10-18 DIAGNOSIS — M542 Cervicalgia: Secondary | ICD-10-CM

## 2017-10-18 DIAGNOSIS — M8580 Other specified disorders of bone density and structure, unspecified site: Secondary | ICD-10-CM | POA: Diagnosis not present

## 2017-10-18 DIAGNOSIS — M5481 Occipital neuralgia: Secondary | ICD-10-CM

## 2017-10-18 DIAGNOSIS — Z9889 Other specified postprocedural states: Secondary | ICD-10-CM | POA: Insufficient documentation

## 2017-10-26 ENCOUNTER — Other Ambulatory Visit: Payer: Self-pay

## 2017-10-26 ENCOUNTER — Ambulatory Visit (INDEPENDENT_AMBULATORY_CARE_PROVIDER_SITE_OTHER): Payer: Medicare Other

## 2017-10-26 VITALS — BP 132/80 | HR 71 | Temp 97.9°F | Resp 14 | Ht 61.25 in | Wt 134.0 lb

## 2017-10-26 DIAGNOSIS — Z0000 Encounter for general adult medical examination without abnormal findings: Secondary | ICD-10-CM | POA: Diagnosis not present

## 2017-10-26 DIAGNOSIS — Z1211 Encounter for screening for malignant neoplasm of colon: Secondary | ICD-10-CM

## 2017-10-26 DIAGNOSIS — Z1331 Encounter for screening for depression: Secondary | ICD-10-CM | POA: Diagnosis not present

## 2017-10-26 MED ORDER — MUPIROCIN 2 % EX OINT: 1.0000 "application " | TOPICAL_OINTMENT | Freq: Two times a day (BID) | CUTANEOUS | 5 refills | Status: DC

## 2017-10-26 NOTE — Progress Notes (Signed)
Subjective:   [name] is a 67 y.o. female who presents for Medicare Annual (Subsequent) preventive examination.  Review of Systems:  No ROS.  Medicare Wellness Visit. Additional risk factors are reflected in the social history.  Cardiac Risk Factors include: advanced age (>39men, >41 women);hypertension     Objective:     Vitals: BP 132/80 (BP Location: Left Arm, Patient Position: Sitting, Cuff Size: Normal)   Pulse 71   Temp 97.9 F (36.6 C) (Oral)   Resp 14   Ht 5' 1.25" (1.556 m)   Wt 134 lb (60.8 kg)   SpO2 99%   BMI 25.11 kg/m   Body mass index is 25.11 kg/m.  Advanced Directives 10/26/2017 10/12/2016 10/15/2015  Does Patient Have a Medical Advance Directive? Yes Yes Yes  Type of Paramedic of [place];Living will [place];Living will [place];Living will  Does patient want to make changes to medical advance directive? No - Patient declined No - Patient declined Yes - information given  Copy of [place] in Chart? No - copy requested No - copy requested No - copy requested    Tobacco Social History   Tobacco Use  Smoking Status Never Smoker  Smokeless Tobacco Never Used     Counseling given: Not Answered   Clinical Intake:  Pre-visit preparation completed: Yes  Pain : No/denies pain     Nutritional Status: BMI of 19-24  Normal Diabetes: No  How often do you need to have someone help you when you read instructions, pamphlets, or other written materials from your doctor or pharmacy?: 1 - Never  Interpreter Needed?: No     Past Medical History:  Diagnosis Date  . Adenomatous polyps 2010   colon  . Allergy   . Arthritis   . Chicken pox   . GERD (gastroesophageal reflux disease)   . Hyperlipidemia   . Hypertension   . Pneumonia    Past Surgical History:  Procedure Laterality Date  . SPINE SURGERY     Family History  Problem Relation Age of Onset  .  Cancer Mother   . Stroke Mother   . Heart disease Mother   . Hypertension Mother   . Diabetes Mother   . Breast cancer Mother 45  . Heart disease Father   . Hypertension Father    Social History   Socioeconomic History  . Marital status: Married    Spouse name: None  . Number of children: None  . Years of education: None  . Highest education level: None  Social Needs  . Financial resource strain: None  . Food insecurity - worry: None  . Food insecurity - inability: None  . Transportation needs - medical: None  . Transportation needs - non-medical: None  Occupational History  . None  Tobacco Use  . Smoking status: Never Smoker  . Smokeless tobacco: Never Used  Substance and Sexual Activity  . Alcohol use: Yes  . Drug use: No  . Sexual activity: Yes  Other Topics Concern  . None  Social History Narrative  . None    Outpatient Encounter Medications as of 10/26/2017  Medication Sig  . Azelastine-Fluticasone (DYMISTA NA) Place 2 sprays into the nose daily as needed.  . Calcium Carbonate (CALTRATE 600 PO) Take 1 tablet by mouth daily. Reported on 10/07/2015  . famotidine (PEPCID) 20 MG tablet TAKE ONE TABLET BY MOUTH TWICE DAILY  . lisinopril (PRINIVIL,ZESTRIL) 5 MG tablet TAKE 1  TABLET BY MOUTH ONCE DAILY  . Multiple Vitamins-Minerals (MULTIVITAMIN WITH MINERALS) tablet Take 1 tablet by mouth daily.  . mupirocin ointment (BACTROBAN) 2 % Place 1 application into the nose 2 (two) times daily. As needed  . triamcinolone (NASACORT) 55 MCG/ACT AERO nasal inhaler Place 2 sprays into the nose as needed.  . vitamin E (VITAMIN E) 1000 UNIT capsule Take 2,000 Units by mouth daily.  . [DISCONTINUED] Azelastine-Fluticasone (DYMISTA) 137-50 MCG/ACT SUSP Place 2 Squirts into the nose daily. In each nostril  . [DISCONTINUED] budesonide-formoterol (SYMBICORT) 80-4.5 MCG/ACT inhaler Inhale 2 puffs into the lungs 2 (two) times daily.  . [DISCONTINUED] doxycycline (MONODOX) 100 MG capsule  Take 1 capsule (100 mg total) by mouth 2 (two) times daily.  . [DISCONTINUED] mometasone (NASONEX) 50 MCG/ACT nasal spray 2 SPRAYS EACH NOSTRIL EVERY DAY AS NEEDED  . [DISCONTINUED] predniSONE (DELTASONE) 10 MG tablet 6 tablets on Day 1 , then reduce by 1 tablet daily until gone   No facility-administered encounter medications on file as of 10/26/2017.     Activities of Daily Living In your present state of health, do you have any difficulty performing the following activities: 10/26/2017  Hearing? Y  Comment Difficulty hearing low conversational tones and in a crowd  Vision? N  Difficulty concentrating or making decisions? N  Walking or climbing stairs? N  Dressing or bathing? N  Doing errands, shopping? N  Preparing Food and eating ? N  Using the Toilet? N  In the past six months, have you accidently leaked urine? N  Do you have problems with loss of bowel control? N  Managing your Medications? N  Managing your Finances? N  Housekeeping or managing your Housekeeping? N  Some recent data might be hidden    Patient Care Team: [name], MD as PCP - General (Internal Medicine)    Assessment:   This is a routine wellness examination for [name]. The goal of the wellness visit is to assist the patient how to close the gaps in care and create a preventative care plan for the patient.   The roster of all physicians providing medical care to patient is listed in the Snapshot section of the chart.  Osteoporosis risk reviewed.    Safety issues reviewed; Smoke and carbon monoxide detectors in the home. No firearms in the home.  Wears seatbelts when driving or riding with others. Patient does wear sunscreen or protective clothing when in direct sunlight. No violence in the home.  Depression- PHQ 2 &9 complete.  No signs/symptoms or verbal communication regarding little pleasure in doing things, feeling down, depressed or hopeless. No changes in sleeping, energy, eating,  concentrating.  No thoughts of self harm or harm towards others.  Time spent on this topic is 10 minutes.   Patient is alert, normal appearance, oriented to person/place/and time. Correctly identified the president of the [place], recall of 3/3 words, and performing simple calculations. Displays appropriate judgement and can read correct time from watch face.   No new identified risk were noted.  No failures at ADL's or IADL's.   BMI- discussed the importance of a healthy diet, water intake and the benefits of aerobic exercise. Educational material provided.   24 hour diet recall: Regular diet  Daily fluid intake: 1 cups of caffeine, 4-6 cups of water  Dental- every 6 months.    Eye- Visual acuity not assessed per patient preference since they have regular follow up with the ophthalmologist.  Wears corrective lenses.  Sleep  patterns- Sleeps 7-8 hours at night.  Wakes feeling rested.   Colonoscopy ordered per patient request.  Educational material provided; follow as directed.  Health maintenance gaps- closed.  Patient Concerns: None at this time. Follow up with PCP as needed.  Exercise Activities and Dietary recommendations Current Exercise Habits: Home exercise routine, Type of exercise: treadmill;stretching;calisthenics, Time (Minutes): 20, Frequency (Times/Week): 5, Weekly Exercise (Minutes/Week): 100, Intensity: Moderate  Goals    . Healthy lifestyle     Stay active and continue exercise regimen Stay hydrated and drink plenty of water Low carb foods. Lean meats, vegetables        Fall Risk Fall Risk  10/26/2017 10/12/2016 10/15/2015 10/01/2014  Falls in the past year? No No No No   Depression Screen PHQ 2/9 Scores 10/26/2017 10/12/2016 10/15/2015 10/01/2014  PHQ - 2 Score 0 0 0 0  PHQ- 9 Score 0 - - -     Cognitive Function MMSE - Mini Mental State Exam 10/26/2017 10/15/2015  Orientation to time 5 5  Orientation to Place 5 5  Registration 3 3  Attention/ Calculation 5 5   Recall 3 3  Language- name 2 objects 2 2  Language- repeat 1 1  Language- follow 3 step command 3 3  Language- read & follow direction 1 1  Write a sentence 1 1  Copy design 1 1  Total score 30 30     6CIT Screen 10/12/2016  What Year? 0 points  What month? 0 points  What time? 0 points  Count back from 20 0 points  Months in reverse 0 points    Immunization History  Administered Date(s) Administered  . Influenza Split 07/26/2012, 08/27/2013  . Influenza, High Dose Seasonal PF 08/25/2015, 08/15/2016  . Influenza-Unspecified 08/29/2014, 08/01/2017  . Pneumococcal Conjugate-13 09/02/2014  . Pneumococcal Polysaccharide-23 06/27/2007, 10/07/2015  . Tdap 12/25/2010  . Zoster 09/26/2013    Screening Tests Health Maintenance  Topic Date Due  . MAMMOGRAM  03/17/2018  . TETANUS/TDAP  12/24/2020  . COLONOSCOPY  01/24/2022  . INFLUENZA VACCINE  Completed  . DEXA SCAN  Completed  . Hepatitis C Screening  Completed  . PNA vac Low Risk Adult  Completed      Plan:    End of life planning; Advance aging; Advanced directives discussed. Copy of current HCPOA/Living Will requested.    I have personally reviewed and noted the following in the patient's chart:   . Medical and social history . Use of alcohol, tobacco or illicit drugs  . Current medications and supplements . Functional ability and status . Nutritional status . Physical activity . Advanced directives . List of other physicians . Hospitalizations, surgeries, and ER visits in previous 12 months . Vitals . Screenings to include cognitive, depression, and falls . Referrals and appointments  In addition, I have reviewed and discussed with patient certain preventive protocols, quality metrics, and best practice recommendations. A written personalized care plan for preventive services as well as general preventive health recommendations were provided to patient.     Varney Biles,  LPN  12/24/3555   Reviewed above information.  Agree with assessment and plan.    Dr Nicki Reaper

## 2017-10-26 NOTE — Patient Instructions (Addendum)
[name] , Thank you for taking time to come for your Medicare Wellness Visit. I appreciate your ongoing commitment to your health goals. Please review the following plan we discussed and let me know if I can assist you in the future.   Colonoscopy ordered; follow as directed.  Follow up with Dr. Derrel Nip as needed.    Bring a copy of your [place] and/or Living Will to be scanned into chart.  Have a great day!  These are the goals we discussed: Goals    . Healthy lifestyle     Stay active and continue exercise regimen Stay hydrated and drink plenty of water Low carb foods. Lean meats, vegetables        This is a list of the screening recommended for you and due dates:  Health Maintenance  Topic Date Due  . Mammogram  03/17/2018  . Tetanus Vaccine  12/24/2020  . Colon Cancer Screening  01/24/2022  . Flu Shot  Completed  . DEXA scan (bone density measurement)  Completed  .  Hepatitis C: One time screening is recommended by Center for Disease Control  (CDC) for  adults born from [birth year] through 1967.   Completed  . Pneumonia vaccines  Completed    Colonoscopy, Adult A colonoscopy is an exam to look at the entire large intestine. During the exam, a lubricated, bendable tube is inserted into the anus and then passed into the rectum, colon, and other parts of the large intestine. A colonoscopy is often done as a part of normal colorectal screening or in response to certain symptoms, such as anemia, persistent diarrhea, abdominal pain, and blood in the stool. The exam can help screen for and diagnose medical problems, including:  Tumors.  Polyps.  Inflammation.  Areas of bleeding.  Tell a health care provider about:  Any allergies you have.  All medicines you are taking, including vitamins, herbs, eye drops, creams, and over-the-counter medicines.  Any problems you or family members have had with anesthetic medicines.  Any blood disorders you  have.  Any surgeries you have had.  Any medical conditions you have.  Any problems you have had passing stool. What are the risks? Generally, this is a safe procedure. However, problems may occur, including:  Bleeding.  A tear in the intestine.  A reaction to medicines given during the exam.  Infection (rare).  What happens before the procedure? Eating and drinking restrictions Follow instructions from your health care provider about eating and drinking, which may include:  A few days before the procedure - follow a low-fiber diet. Avoid nuts, seeds, dried fruit, raw fruits, and vegetables.  1-3 days before the procedure - follow a clear liquid diet. Drink only clear liquids, such as clear broth or bouillon, black coffee or tea, clear juice, clear soft drinks or sports drinks, gelatin dessert, and popsicles. Avoid any liquids that contain red or purple dye.  On the day of the procedure - do not eat or drink anything during the 2 hours before the procedure, or within the time period that your health care provider recommends.  Bowel prep If you were prescribed an oral bowel prep to clean out your colon:  Take it as told by your health care provider. Starting the day before your procedure, you will need to drink a large amount of medicated liquid. The liquid will cause you to have multiple loose stools until your stool is almost clear or light green.  If your skin  or anus gets irritated from diarrhea, you may use these to relieve the irritation: ? Medicated wipes, such as adult wet wipes with aloe and vitamin E. ? A skin soothing-product like petroleum jelly.  If you vomit while drinking the bowel prep, take a break for up to 60 minutes and then begin the bowel prep again. If vomiting continues and you cannot take the bowel prep without vomiting, call your health care provider.  General instructions  Ask your health care provider about changing or stopping your regular  medicines. This is especially important if you are taking diabetes medicines or blood thinners.  Plan to have someone take you home from the hospital or clinic. What happens during the procedure?  An IV tube may be inserted into one of your veins.  You will be given medicine to help you relax (sedative).  To reduce your risk of infection: ? Your health care team will wash or sanitize their hands. ? Your anal area will be washed with soap.  You will be asked to lie on your side with your knees bent.  Your health care provider will lubricate a long, thin, flexible tube. The tube will have a camera and a light on the end.  The tube will be inserted into your anus.  The tube will be gently eased through your rectum and colon.  Air will be delivered into your colon to keep it open. You may feel some pressure or cramping.  The camera will be used to take images during the procedure.  A small tissue sample may be removed from your body to be examined under a microscope (biopsy). If any potential problems are found, the tissue will be sent to a lab for testing.  If small polyps are found, your health care provider may remove them and have them checked for cancer cells.  The tube that was inserted into your anus will be slowly removed. The procedure may vary among health care providers and hospitals. What happens after the procedure?  Your blood pressure, heart rate, breathing rate, and blood oxygen level will be monitored until the medicines you were given have worn off.  Do not drive for 24 hours after the exam.  You may have a small amount of blood in your stool.  You may pass gas and have mild abdominal cramping or bloating due to the air that was used to inflate your colon during the exam.  It is up to you to get the results of your procedure. Ask your health care provider, or the department performing the procedure, when your results will be ready. This information is not  intended to replace advice given to you by your health care provider. Make sure you discuss any questions you have with your health care provider. Document Released: 10/07/2000 Document Revised: 08/10/2016 Document Reviewed: 12/22/2015 Elsevier Interactive Patient Education  2018 [employer].

## 2017-11-09 DIAGNOSIS — Z1211 Encounter for screening for malignant neoplasm of colon: Secondary | ICD-10-CM | POA: Diagnosis not present

## 2017-11-09 DIAGNOSIS — E739 Lactose intolerance, unspecified: Secondary | ICD-10-CM | POA: Diagnosis not present

## 2017-11-24 DIAGNOSIS — Z86010 Personal history of colonic polyps: Secondary | ICD-10-CM | POA: Diagnosis not present

## 2017-11-24 DIAGNOSIS — D123 Benign neoplasm of transverse colon: Secondary | ICD-10-CM | POA: Diagnosis not present

## 2017-11-24 DIAGNOSIS — K648 Other hemorrhoids: Secondary | ICD-10-CM | POA: Diagnosis not present

## 2017-11-24 DIAGNOSIS — K640 First degree hemorrhoids: Secondary | ICD-10-CM | POA: Diagnosis not present

## 2017-11-24 DIAGNOSIS — D126 Benign neoplasm of colon, unspecified: Secondary | ICD-10-CM | POA: Diagnosis not present

## 2017-11-24 DIAGNOSIS — Z1211 Encounter for screening for malignant neoplasm of colon: Secondary | ICD-10-CM | POA: Diagnosis not present

## 2017-11-24 LAB — HM COLONOSCOPY

## 2017-12-01 ENCOUNTER — Other Ambulatory Visit: Payer: Self-pay | Admitting: Internal Medicine

## 2017-12-01 MED ORDER — LEVOFLOXACIN 500 MG PO TABS: 500.0000 mg | ORAL_TABLET | Freq: Every day | ORAL | 0 refills | Status: DC

## 2017-12-04 ENCOUNTER — Encounter: Payer: Self-pay | Admitting: Internal Medicine

## 2018-02-01 ENCOUNTER — Other Ambulatory Visit: Payer: Self-pay | Admitting: Internal Medicine

## 2018-02-01 DIAGNOSIS — Z1231 Encounter for screening mammogram for malignant neoplasm of breast: Secondary | ICD-10-CM

## 2018-02-19 ENCOUNTER — Other Ambulatory Visit: Payer: Self-pay | Admitting: Internal Medicine

## 2018-02-27 ENCOUNTER — Encounter: Payer: Self-pay | Admitting: Internal Medicine

## 2018-02-27 ENCOUNTER — Ambulatory Visit (INDEPENDENT_AMBULATORY_CARE_PROVIDER_SITE_OTHER): Payer: Medicare Other | Admitting: Internal Medicine

## 2018-02-27 VITALS — BP 150/80 | HR 62 | Temp 98.3°F | Ht 61.25 in | Wt 137.6 lb

## 2018-02-27 DIAGNOSIS — J309 Allergic rhinitis, unspecified: Secondary | ICD-10-CM | POA: Diagnosis not present

## 2018-02-27 DIAGNOSIS — J320 Chronic maxillary sinusitis: Secondary | ICD-10-CM | POA: Diagnosis not present

## 2018-02-27 DIAGNOSIS — J020 Streptococcal pharyngitis: Secondary | ICD-10-CM | POA: Diagnosis not present

## 2018-02-27 LAB — POCT RAPID STREP A (OFFICE): Rapid Strep A Screen: POSITIVE — AB

## 2018-02-27 MED ORDER — IPRATROPIUM BROMIDE 0.06 % NA SOLN: 2.0000 | Freq: Four times a day (QID) | NASAL | 2 refills | Status: DC

## 2018-02-27 MED ORDER — AZITHROMYCIN 250 MG PO TABS: ORAL_TABLET | ORAL | 0 refills | Status: DC

## 2018-02-27 NOTE — Progress Notes (Signed)
Pre visit review using our clinic review tool, if applicable. No additional management support is needed unless otherwise documented below in the visit note. 

## 2018-02-27 NOTE — Progress Notes (Signed)
Chief Complaint  Patient presents with  . Follow-up    sinus problems/allergies    Acute visit  C/o sinus pressure cheeks, right ear pain, postnasal drip, right ear blocked and stuffy with green drainage, sore throat Tried Dymista alt with nasacort cant tolerate NS well due to nasal burning. She does have h/o seasonal allergies which flared 12/24/17. She reports she did not take levaquin given 12/01/17 rx by PCP. She is established with Dr. Pryor Ochoa and also h/o getting allergy shots not currently. She is not taking OTC meds for allergies. Yolanda Bonine was sick with strep recently   Review of Systems  Constitutional: Negative for fever.  HENT: Positive for ear pain, sinus pain and sore throat. Negative for hearing loss.   Eyes: Negative for blurred vision.  Respiratory: Negative for cough.   Cardiovascular: Negative for chest pain.   Past Medical History:  Diagnosis Date  . Adenomatous polyps 2010   colon  . Allergy   . Arthritis   . Chicken pox   . GERD (gastroesophageal reflux disease)   . Hyperlipidemia   . Hypertension   . Pneumonia    Past Surgical History:  Procedure Laterality Date  . SPINE SURGERY     Family History  Problem Relation Age of Onset  . Cancer Mother   . Stroke Mother   . Heart disease Mother   . Hypertension Mother   . Diabetes Mother   . Breast cancer Mother 86  . Heart disease Father   . Hypertension Father    Social History   Socioeconomic History  . Marital status: Married    Spouse name: Not on file  . Number of children: Not on file  . Years of education: Not on file  . Highest education level: Not on file  Occupational History  . Not on file  Social Needs  . Financial resource strain: Not on file  . Food insecurity:    Worry: Not on file    Inability: Not on file  . Transportation needs:    Medical: Not on file    Non-medical: Not on file  Tobacco Use  . Smoking status: Never Smoker  . Smokeless tobacco: Never Used  Substance and  Sexual Activity  . Alcohol use: Yes  . Drug use: No  . Sexual activity: Yes  Lifestyle  . Physical activity:    Days per week: Not on file    Minutes per session: Not on file  . Stress: Not on file  Relationships  . Social connections:    Talks on phone: Not on file    Gets together: Not on file    Attends religious service: Not on file    Active member of club or organization: Not on file    Attends meetings of clubs or organizations: Not on file    Relationship status: Not on file  . Intimate partner violence:    Fear of current or ex partner: Not on file    Emotionally abused: Not on file    Physically abused: Not on file    Forced sexual activity: Not on file  Other Topics Concern  . Not on file  Social History Narrative  . Not on file   Current Meds  Medication Sig  . Azelastine-Fluticasone (DYMISTA NA) Place 2 sprays into the nose daily as needed.  . Calcium Carbonate (CALTRATE 600 PO) Take 1 tablet by mouth daily. Reported on 10/07/2015  . famotidine (PEPCID) 20 MG tablet TAKE ONE TABLET BY MOUTH  TWICE DAILY  . lisinopril (PRINIVIL,ZESTRIL) 5 MG tablet TAKE 1 TABLET BY MOUTH ONCE DAILY  . Multiple Vitamins-Minerals (MULTIVITAMIN WITH MINERALS) tablet Take 1 tablet by mouth daily.  . mupirocin ointment (BACTROBAN) 2 % Place 1 application into the nose 2 (two) times daily. As needed  . triamcinolone (NASACORT) 55 MCG/ACT AERO nasal inhaler Place 2 sprays into the nose as needed.  . vitamin E (VITAMIN E) 1000 UNIT capsule Take 2,000 Units by mouth daily.   Allergies  Allergen Reactions  . Contrast Media [Iodinated Diagnostic Agents] Swelling  . Iodine Swelling  . Milk-Related Compounds Diarrhea  . Prednisolone Anxiety    Agitation to oral prednisone tapers   . Corn-Containing Products     headaches  . Penicillins     Childhood does not know reaction  . Sulfa Antibiotics Hives   No results found for this or any previous visit (from the past 2160  hour(s)). Objective  Body mass index is 25.79 kg/m. Wt Readings from Last 3 Encounters:  02/27/18 137 lb 9.6 oz (62.4 kg)  10/26/17 134 lb (60.8 kg)  11/21/16 133 lb (60.3 kg)   Temp Readings from Last 3 Encounters:  02/27/18 98.3 F (36.8 C) (Oral)  10/26/17 97.9 F (36.6 C) (Oral)  05/15/17 98.2 F (36.8 C) (Oral)   BP Readings from Last 3 Encounters:  02/27/18 (!) 150/80  10/26/17 132/80  05/15/17 (!) 148/90   Pulse Readings from Last 3 Encounters:  02/27/18 62  10/26/17 71  05/15/17 76    Physical Exam  Constitutional: She is oriented to person, place, and time. Vital signs are normal. She appears well-developed and well-nourished. She is cooperative.  HENT:  Head: Normocephalic and atraumatic.  Nose: Right sinus exhibits maxillary sinus tenderness. Right sinus exhibits no frontal sinus tenderness. Left sinus exhibits maxillary sinus tenderness. Left sinus exhibits no frontal sinus tenderness.  Mouth/Throat: Posterior oropharyngeal erythema present.  Eyes: Pupils are equal, round, and reactive to light. Conjunctivae are normal.  Cardiovascular: Normal rate, regular rhythm and normal heart sounds.  Pulmonary/Chest: Effort normal and breath sounds normal.  Neurological: She is alert and oriented to person, place, and time. Gait normal.  Skin: Skin is warm, dry and intact.  Psychiatric: She has a normal mood and affect. Her speech is normal and behavior is normal. Judgment and thought content normal. Cognition and memory are normal.  Nursing note and vitals reviewed.   Assessment   1. Sinusitis with PND and sore throat + strep, allergic rhinitis, right ear pain   Plan  1.  Trial of otc antihistamines, prn zyrtec, or allegra, or claritin  Tried singulair in the past and did not work  Trial of atrovent with prn nasacort or dymista Consider f/u with Dr.Vaught or allergy referral if not better  Strep test + tx zpack , warm salt gargles  Provider: Dr. Olivia Mackie  [facility]

## 2018-02-27 NOTE — Patient Instructions (Addendum)
Please try warm salt water gargles  As needed Tylenol  As need Claritin or Zyrtec or Allegra without the D for allergies  Consider Dr. Pryor Ochoa or allergist if sinus symptoms, allergy symptoms do no improved    Strep Throat Strep throat is a bacterial infection of the throat. Your health care provider may call the infection tonsillitis or pharyngitis, depending on whether there is swelling in the tonsils or at the back of the throat. Strep throat is most common during the cold months of the year in children who are 8-53 years of age, but it can happen during any season in people of any age. This infection is spread from person to person (contagious) through coughing, sneezing, or close contact. What are the causes? Strep throat is caused by the bacteria called Streptococcus pyogenes. What increases the risk? This condition is more likely to develop in:  People who spend time in crowded places where the infection can spread easily.  People who have close contact with someone who has strep throat.  What are the signs or symptoms? Symptoms of this condition include:  Fever or chills.  Redness, swelling, or pain in the tonsils or throat.  Pain or difficulty when swallowing.  White or yellow spots on the tonsils or throat.  Swollen, tender glands in the neck or under the jaw.  Red rash all over the body (rare).  How is this diagnosed? This condition is diagnosed by performing a rapid strep test or by taking a swab of your throat (throat culture test). Results from a rapid strep test are usually ready in a few minutes, but throat culture test results are available after one or two days. How is this treated? This condition is treated with antibiotic medicine. Follow these instructions at home: Medicines  Take over-the-counter and prescription medicines only as told by your health care provider.  Take your antibiotic as told by your health care provider. Do not stop taking the  antibiotic even if you start to feel better.  Have family members who also have a sore throat or fever tested for strep throat. They may need antibiotics if they have the strep infection. Eating and drinking  Do not share food, drinking cups, or personal items that could cause the infection to spread to other people.  If swallowing is difficult, try eating soft foods until your sore throat feels better.  Drink enough fluid to keep your urine clear or pale yellow. General instructions  Gargle with a salt-water mixture 3-4 times per day or as needed. To make a salt-water mixture, completely dissolve -1 tsp of salt in 1 cup of warm water.  Make sure that all household members wash their hands well.  Get plenty of rest.  Stay home from school or work until you have been taking antibiotics for 24 hours.  Keep all follow-up visits as told by your health care provider. This is important. Contact a health care provider if:  The glands in your neck continue to get bigger.  You develop a rash, cough, or earache.  You cough up a thick liquid that is green, yellow-brown, or bloody.  You have pain or discomfort that does not get better with medicine.  Your problems seem to be getting worse rather than better.  You have a fever. Get help right away if:  You have new symptoms, such as vomiting, severe headache, stiff or painful neck, chest pain, or shortness of breath.  You have severe throat pain, drooling, or changes  in your voice.  You have swelling of the neck, or the skin on the neck becomes red and tender.  You have signs of dehydration, such as fatigue, dry mouth, and decreased urination.  You become increasingly sleepy, or you cannot wake up completely.  Your joints become red or painful. This information is not intended to replace advice given to you by your health care provider. Make sure you discuss any questions you have with your health care provider. Document Released:  10/07/2000 Document Revised: 06/08/2016 Document Reviewed: 02/02/2015 Elsevier Interactive Patient Education  [employer].

## 2018-03-20 ENCOUNTER — Ambulatory Visit
Admission: RE | Admit: 2018-03-20 | Discharge: 2018-03-20 | Disposition: A | Discharge status: Home / Self Care | Payer: Medicare Other | Source: Ambulatory Visit | Attending: Internal Medicine | Admitting: Internal Medicine

## 2018-03-20 DIAGNOSIS — Z1231 Encounter for screening mammogram for malignant neoplasm of breast: Secondary | ICD-10-CM | POA: Insufficient documentation

## 2018-06-06 ENCOUNTER — Ambulatory Visit (INDEPENDENT_AMBULATORY_CARE_PROVIDER_SITE_OTHER): Payer: Medicare Other | Admitting: Internal Medicine

## 2018-06-06 ENCOUNTER — Encounter: Payer: Self-pay | Admitting: Internal Medicine

## 2018-06-06 VITALS — BP 140/80 | HR 65 | Temp 98.1°F | Resp 15 | Ht 61.0 in | Wt 133.8 lb

## 2018-06-06 DIAGNOSIS — M810 Age-related osteoporosis without current pathological fracture: Secondary | ICD-10-CM

## 2018-06-06 DIAGNOSIS — E7800 Pure hypercholesterolemia, unspecified: Secondary | ICD-10-CM

## 2018-06-06 DIAGNOSIS — I10 Essential (primary) hypertension: Secondary | ICD-10-CM | POA: Diagnosis not present

## 2018-06-06 DIAGNOSIS — E785 Hyperlipidemia, unspecified: Secondary | ICD-10-CM

## 2018-06-06 DIAGNOSIS — J320 Chronic maxillary sinusitis: Secondary | ICD-10-CM | POA: Diagnosis not present

## 2018-06-06 DIAGNOSIS — Z87442 Personal history of urinary calculi: Secondary | ICD-10-CM | POA: Diagnosis not present

## 2018-06-06 DIAGNOSIS — Z780 Asymptomatic menopausal state: Secondary | ICD-10-CM

## 2018-06-06 DIAGNOSIS — Z0000 Encounter for general adult medical examination without abnormal findings: Secondary | ICD-10-CM

## 2018-06-06 LAB — COMPREHENSIVE METABOLIC PANEL
ALT: 20 U/L (ref 0–35)
AST: 20 U/L (ref 0–37)
Albumin: 4.3 g/dL (ref 3.5–5.2)
Alkaline Phosphatase: 55 U/L (ref 39–117)
BUN: 22 mg/dL (ref 6–23)
CHLORIDE: 106 meq/L (ref 96–112)
CO2: 29 meq/L (ref 19–32)
Calcium: 9.9 mg/dL (ref 8.4–10.5)
Creatinine, Ser: 0.76 mg/dL (ref 0.40–1.20)
GFR: 80.19 mL/min (ref >60.00)
GLUCOSE: 94 mg/dL (ref 70–99)
POTASSIUM: 4.2 meq/L (ref 3.5–5.1)
Sodium: 140 mEq/L (ref 135–145)
Total Bilirubin: 0.4 mg/dL (ref 0.2–1.2)
Total Protein: 7 g/dL (ref 6.0–8.3)

## 2018-06-06 LAB — TSH: TSH: 3.25 u[IU]/mL (ref 0.35–4.50)

## 2018-06-06 LAB — LIPID PANEL
CHOL/HDL RATIO: 4
Cholesterol: 245 mg/dL — ABNORMAL HIGH (ref 0–200)
HDL: 57 mg/dL (ref >39.00)
LDL CALC: 155 mg/dL — AB (ref 0–99)
NONHDL: 188.11
Triglycerides: 164 mg/dL — ABNORMAL HIGH (ref 0.0–149.0)
VLDL: 32.8 mg/dL (ref 0.0–40.0)

## 2018-06-06 LAB — LDL CHOLESTEROL, DIRECT: LDL DIRECT: 173 mg/dL

## 2018-06-06 MED ORDER — DOXYCYCLINE HYCLATE 100 MG PO TABS: 100.0000 mg | ORAL_TABLET | Freq: Two times a day (BID) | ORAL | 0 refills | Status: DC

## 2018-06-06 NOTE — Patient Instructions (Addendum)
The new goals for optimal blood pressure management are 120/70  I .  Please check your blood pressure a few times at home and send me the readings so I can determine if you need a change in medication .  I will not change your meds unless you home readings are > 130/80 .   I recommend that you take the neck CT images with you on a disk to your endodontist  before the implant is done    You don't need any imaging studies of your kidney stones unless you start seeing blood In your  urine or having typical "stone" pain .

## 2018-06-06 NOTE — Progress Notes (Signed)
Subjective:  Patient ID: [name], female    DOB: 09/04/1951  Age: 67 y.o. MRN: [id number]  CC: The primary encounter diagnosis was Pure hypercholesterolemia. Diagnoses of Essential hypertension, benign, History of nephrolithiasis, Osteopenia after menopause, Chronic maxillary sinusitis, Hyperlipidemia with target LDL less than 130, and Encounter for preventive health examination were also pertinent to this visit.  HPI OTA EBERSOLE presents for follow up on hypertension , polyarthritis  And GERD    Treated for strep throat in May   Troublesome tooth extracted from right maxilla in June  . History of root canal surgery in June 2018.  Sinus was punctured (per notes from  ENT.  Had a simmering infection in the tooth /bone for apparently 11 years that had been causing periodic  pain that would improve transiently  With antibiotics.  Frustrated , getting an implant   HTN:  Home readings Have not been checked recently.  Too much company    Decreased concentration /attention noted on recent AWV.  Feels it is chronic and aggravated by people interrupting her , gets annoyed just thinking about it.  Sleeping better  Averaging 6 hours.  Has been feeling better concentration is better lately     Going skiing in [place] this December    Colonoscopy Feb 2019 polyp removed. Serrated adenoma  3 yr follow up needed   No tick bites thus far,  Very bothered by her husband's practice of lying on the bed in the same clothes he worked in the yard  And worried about getting another tick bite.  Had a rash reaction the the cream I prescribed for her husband.   DEXA needed   Last one noted osteopenia 2013  History of kidney stones.  Has not had symptoms in several years     Outpatient Medications Prior to Visit  Medication Sig Dispense Refill  . Azelastine-Fluticasone (DYMISTA NA) Place 2 sprays into the nose daily as needed.    . Calcium Carbonate (CALTRATE 600 PO) Take 1 tablet by mouth daily. Reported  on 10/07/2015    . famotidine (PEPCID) 20 MG tablet TAKE ONE TABLET BY MOUTH TWICE DAILY 180 tablet 3  . lisinopril (PRINIVIL,ZESTRIL) 5 MG tablet TAKE 1 TABLET BY MOUTH ONCE DAILY 90 tablet 1  . mupirocin ointment (BACTROBAN) 2 % Place 1 application into the nose 2 (two) times daily. As needed 22 g 5  . triamcinolone (NASACORT) 55 MCG/ACT AERO nasal inhaler Place 2 sprays into the nose as needed.    Marland Kitchen azithromycin (ZITHROMAX) 250 MG tablet 2 pills day 1 and 1 pill day 2-5 6 tablet 0  . ipratropium (ATROVENT) 0.06 % nasal spray Place 2 sprays into both nostrils 4 (four) times daily. 15 mL 2  . Multiple Vitamins-Minerals (MULTIVITAMIN WITH MINERALS) tablet Take 1 tablet by mouth daily.    . vitamin E (VITAMIN E) 1000 UNIT capsule Take 2,000 Units by mouth daily.     No facility-administered medications prior to visit.     Review of Systems;  Patient denies headache, fevers, malaise, unintentional weight loss, skin rash, eye pain, sinus congestion and sinus pain, sore throat, dysphagia,  hemoptysis , cough, dyspnea, wheezing, chest pain, palpitations, orthopnea, edema, abdominal pain, nausea, melena, diarrhea, constipation, flank pain, dysuria, hematuria, urinary  Frequency, nocturia, numbness, tingling, seizures,  Focal weakness, Loss of consciousness,  Tremor, insomnia, depression, anxiety, and suicidal ideation.      Objective:  BP 140/80 (BP Location: Left Arm, Patient Position: Sitting, Cuff Size:  Normal)   Pulse 65   Temp 98.1 F (36.7 C) (Oral)   Resp 15   Ht 5\' 1"  (1.549 m)   Wt 133 lb 12.8 oz (60.7 kg)   SpO2 98%   BMI 25.28 kg/m   BP Readings from Last 3 Encounters:  06/06/18 140/80  02/27/18 (!) 150/80  10/26/17 132/80    Wt Readings from Last 3 Encounters:  06/06/18 133 lb 12.8 oz (60.7 kg)  02/27/18 137 lb 9.6 oz (62.4 kg)  10/26/17 134 lb (60.8 kg)    General appearance: alert, cooperative and appears stated age Ears: normal TM's and external ear canals both  ears Throat: lips, mucosa, and tongue normal; teeth and gums normal Neck: no adenopathy, no carotid bruit, supple, symmetrical, trachea midline and thyroid not enlarged, symmetric, no tenderness/mass/nodules Back: symmetric, no curvature. ROM normal. No CVA tenderness. Lungs: clear to auscultation bilaterally Heart: regular rate and rhythm, S1, S2 normal, no murmur, click, rub or gallop Abdomen: soft, non-tender; bowel sounds normal; no masses,  no organomegaly Pulses: 2+ and symmetric Skin: Skin color, texture, turgor normal. No rashes or lesions Lymph nodes: Cervical, supraclavicular, and axillary nodes normal.  No results found for: HGBA1C  Lab Results  Component Value Date   CREATININE 0.76 06/06/2018   CREATININE 0.72 11/21/2016   CREATININE 0.73 03/30/2016    Lab Results  Component Value Date   WBC 5.2 05/26/2017   HGB 13.4 05/26/2017   HCT 40.3 05/26/2017   PLT 227 05/26/2017   GLUCOSE 94 06/06/2018   CHOL 245 (H) 06/06/2018   TRIG 164.0 (H) 06/06/2018   HDL 57.00 06/06/2018   LDLDIRECT 173.0 06/06/2018   LDLCALC 155 (H) 06/06/2018   ALT 20 06/06/2018   AST 20 06/06/2018   NA 140 06/06/2018   K 4.2 06/06/2018   CL 106 06/06/2018   CREATININE 0.76 06/06/2018   BUN 22 06/06/2018   CO2 29 06/06/2018   TSH 3.25 06/06/2018    Mm 3d Screen Breast Bilateral  Result Date: 03/21/2018 CLINICAL DATA:  Screening. EXAM: DIGITAL SCREENING BILATERAL MAMMOGRAM WITH TOMO AND CAD COMPARISON:  Previous exam(s). ACR Breast Density Category b: There are scattered areas of fibroglandular density. FINDINGS: There are no findings suspicious for malignancy. Images were processed with CAD. IMPRESSION: No mammographic evidence of malignancy. A result letter of this screening mammogram will be mailed directly to the patient. RECOMMENDATION: Screening mammogram in one year. (Code:SM-B-01Y) BI-RADS CATEGORY  1: Negative. Electronically Signed   By: Curlene Dolphin M.D.   On: 03/21/2018 13:11     Assessment & Plan:   Problem List Items Addressed This Visit    Essential hypertension, benign    She has [place].  Home readings have been < 130/80 per husband.       Relevant Orders   Comprehensive metabolic panel (Completed)   Hyperlipidemia with target LDL less than 130    Untreated due to memory loss with statin use which subjectively improved with cessation.    Lab Results  Component Value Date   CHOL 245 (H) 06/06/2018   HDL 57.00 06/06/2018   LDLCALC 155 (H) 06/06/2018   LDLDIRECT 173.0 06/06/2018   TRIG 164.0 (H) 06/06/2018   CHOLHDL 4 06/06/2018         Sinusitis    Recurrent maxillary secondary to chronic infection due to punctured sinus many years ago .  Now resolved. CT scan done by ENT       Relevant Medications   doxycycline (VIBRA-TABS) 100 MG  tablet   Hyperlipidemia - Primary   Relevant Orders   Lipid panel (Completed)   LDL cholesterol, direct (Completed)   TSH (Completed)   Encounter for preventive health examination    age appropriate education and counseling updated, referrals for preventative services and immunizations addressed, dietary and smoking counseling addressed, most recent labs reviewed.  I have personally reviewed and have noted:  1) the patient's medical and social history 2) The pt's use of alcohol, tobacco, and illicit drugs 3) The patient's current medications and supplements 4) Functional ability including ADL's, fall risk, home safety risk, hearing and visual impairment 5) Diet and physical activities 6) Evidence for depression or mood disorder 7) The patient's height, weight, and BMI have been recorded in the chart  I have made referrals, and provided counseling and education based on review of the above      History of nephrolithiasis    Last imaged in 2012  Asymptomatic.  Prior lithotripsy x 2.          Other Visit Diagnoses    Osteopenia after menopause       Relevant Orders   DG Bone Density     A total of 40  minutes was spent with patient more than half of which was spent in counseling patient on the above mentioned issues , reviewing and explaining recent labs and imaging studies done, and coordination of care.  I have discontinued Sadee Osland. Kilcrease's multivitamin with minerals, vitamin E, ipratropium, and azithromycin. I am also having her start on doxycycline. Additionally, I am having her maintain her Calcium Carbonate (CALTRATE 600 PO), famotidine, triamcinolone, Azelastine-Fluticasone (DYMISTA NA), mupirocin ointment, and lisinopril.  Meds ordered this encounter  Medications  . doxycycline (VIBRA-TABS) 100 MG tablet    Sig: Take 1 tablet (100 mg total) by mouth 2 (two) times daily.    Dispense:  20 tablet    Refill:  0    KEEP ON FILE FOR FUTURE REFILLS    Medications Discontinued During This Encounter  Medication Reason  . azithromycin (ZITHROMAX) 250 MG tablet   . Multiple Vitamins-Minerals (MULTIVITAMIN WITH MINERALS) tablet   . vitamin E (VITAMIN E) 1000 UNIT capsule   . ipratropium (ATROVENT) 0.06 % nasal spray Patient Preference    Follow-up: No follow-ups on file.   [name], MD

## 2018-06-06 NOTE — Assessment & Plan Note (Signed)
Last imaged in 2012  Asymptomatic.  Prior lithotripsy x 2.

## 2018-06-07 NOTE — Assessment & Plan Note (Addendum)
Untreated due to memory loss with statin use which subjectively improved with cessation.  Her ten yer risk of CAD is 15%.  Zetia trial advised.   Lab Results  Component Value Date   CHOL 245 (H) 06/06/2018   HDL 57.00 06/06/2018   LDLCALC 155 (H) 06/06/2018   LDLDIRECT 173.0 06/06/2018   TRIG 164.0 (H) 06/06/2018   CHOLHDL 4 06/06/2018

## 2018-06-07 NOTE — Assessment & Plan Note (Addendum)
Recurrent maxillary secondary to chronic infection due to punctured sinus many years ago .  Now resolved. CT scan done by ENT

## 2018-06-07 NOTE — Assessment & Plan Note (Signed)
She has [place].  Home readings have been < 130/80 per husband.

## 2018-06-07 NOTE — Assessment & Plan Note (Signed)

## 2018-06-08 DIAGNOSIS — H43811 Vitreous degeneration, right eye: Secondary | ICD-10-CM | POA: Diagnosis not present

## 2018-06-27 ENCOUNTER — Other Ambulatory Visit: Payer: Self-pay | Admitting: Internal Medicine

## 2018-06-27 MED ORDER — EZETIMIBE 10 MG PO TABS: 10.0000 mg | ORAL_TABLET | Freq: Every day | ORAL | 5 refills | Status: DC

## 2018-07-09 DIAGNOSIS — H43811 Vitreous degeneration, right eye: Secondary | ICD-10-CM | POA: Diagnosis not present

## 2018-07-10 ENCOUNTER — Ambulatory Visit (INDEPENDENT_AMBULATORY_CARE_PROVIDER_SITE_OTHER): Payer: Medicare Other | Admitting: Family Medicine

## 2018-07-10 ENCOUNTER — Encounter: Payer: Self-pay | Admitting: Family Medicine

## 2018-07-10 VITALS — BP 124/76 | HR 67 | Temp 98.4°F | Ht 61.0 in | Wt 133.2 lb

## 2018-07-10 DIAGNOSIS — J029 Acute pharyngitis, unspecified: Secondary | ICD-10-CM

## 2018-07-10 DIAGNOSIS — J309 Allergic rhinitis, unspecified: Secondary | ICD-10-CM | POA: Diagnosis not present

## 2018-07-10 LAB — POCT RAPID STREP A (OFFICE): RAPID STREP A SCREEN: NEGATIVE

## 2018-07-10 NOTE — Progress Notes (Signed)
Subjective:    Patient ID: [name], female    DOB: 17-May-1951, 67 y.o.   MRN: [id number]  HPI   Patient presents to clinic due to scratchy throat for 2 weeks. States her grandson did have strep throat recently and wants to be sure she does not  Patient does have chronic allergies. Uses dymista nasal spray as needed.    Patient Active Problem List   Diagnosis Date Noted  . History of nephrolithiasis 06/06/2018  . Allergic rhinitis 02/27/2018  . History of adenomatous polyp of colon 07/29/2017  . Rash of unknown cause 05/16/2017  . Encounter for preventive health examination 10/09/2015  . Polyarthritis of multiple sites ([place]) 08/11/2015  . Hyperlipidemia 10/12/2014  . Family history of breast cancer 10/01/2014  . Medicare welcome visit 09/28/2013  . Screening for malignant neoplasm of the cervix 09/28/2013  . Statin intolerance 09/28/2013  . Sinusitis 09/28/2013  . Essential hypertension, benign 06/26/2013  . Hyperlipidemia with target LDL less than 130 06/26/2013  . Family history of clotting disorder 06/26/2013   Social History   Tobacco Use  . Smoking status: Never Smoker  . Smokeless tobacco: Never Used  Substance Use Topics  . Alcohol use: Yes   Review of Systems  Constitutional: Negative for chills, fatigue and fever.  HENT: Positive for congestion, post nasal drip and sore throat.   Eyes: Negative.   Respiratory: Negative for cough, shortness of breath and wheezing.   Cardiovascular: Negative for chest pain, palpitations and leg swelling.  Gastrointestinal: Negative for abdominal pain, diarrhea, nausea and vomiting.  Genitourinary: Negative for dysuria, frequency and urgency.  Musculoskeletal: Negative for arthralgias and myalgias.  Skin: Negative for color change, pallor and rash.  Neurological: Negative for syncope, light-headedness and headaches.  Psychiatric/Behavioral: The patient is not nervous/anxious.       Objective:   Physical Exam    Constitutional: She is oriented to person, place, and time.  HENT:  Head: Normocephalic and atraumatic.  Right Ear: Tympanic membrane normal.  Left Ear: Tympanic membrane normal.  Mouth/Throat: Uvula is midline and mucous membranes are normal. No oropharyngeal exudate. No tonsillar exudate.  Moderate post nasal drip.  Eyes: EOM are normal.  Neck: Neck supple.  Cardiovascular: Normal rate and regular rhythm.  Pulmonary/Chest: Effort normal and breath sounds normal. She has no wheezes. She has no rhonchi. She has no rales.  Lymphadenopathy:    She has no cervical adenopathy.  Neurological: She is alert and oriented to person, place, and time.  Skin: Skin is warm and dry. No pallor.  Psychiatric: She has a normal mood and affect. Her behavior is normal.  Nursing note and vitals reviewed.     Vitals:   07/10/18 1059  BP: 138/90  Pulse: 67  Temp: 98.4 F (36.9 C)  SpO2: 98%    Assessment & Plan:   Pain in throat/postnasal drip- rapid strep in clinic is negative.  Throat culture also collected and sent to lab also due to patient's past history of strep and being around grandson who recently had strep.  Patient advised to use Dymista to help control allergy symptoms.  Also suggest that she can do saline nasal washes to help congestion.  Patient will increase fluids, use ibuprofen or Tylenol as needed for pain and do good handwashing.  If throat culture does come back positive patient is aware that we will call to let her know and appropriate treatment will be sent to pharmacy.  Patient will keep regularly scheduled follow-up  as already planned.  Advised she can return to clinic sooner if any issues arise.  Patient will get flu vaccine once we have results of throat culture back and can confirm it is negative/positive for strep.

## 2018-07-11 ENCOUNTER — Encounter: Payer: Self-pay | Admitting: Family Medicine

## 2018-07-11 DIAGNOSIS — J029 Acute pharyngitis, unspecified: Secondary | ICD-10-CM | POA: Insufficient documentation

## 2018-07-13 LAB — CULTURE, UPPER RESPIRATORY
MICRO NUMBER:: 91113901
SPECIMEN QUALITY: ADEQUATE

## 2018-07-15 DIAGNOSIS — Z23 Encounter for immunization: Secondary | ICD-10-CM | POA: Diagnosis not present

## 2018-07-16 ENCOUNTER — Other Ambulatory Visit: Payer: Self-pay | Admitting: Internal Medicine

## 2018-07-18 ENCOUNTER — Ambulatory Visit
Admission: RE | Admit: 2018-07-18 | Discharge: 2018-07-18 | Disposition: A | Discharge status: Home / Self Care | Payer: Medicare Other | Source: Ambulatory Visit | Attending: Internal Medicine | Admitting: Internal Medicine

## 2018-07-18 DIAGNOSIS — M810 Age-related osteoporosis without current pathological fracture: Secondary | ICD-10-CM | POA: Diagnosis not present

## 2018-07-18 DIAGNOSIS — M85852 Other specified disorders of bone density and structure, left thigh: Secondary | ICD-10-CM | POA: Diagnosis not present

## 2018-07-18 DIAGNOSIS — M8580 Other specified disorders of bone density and structure, unspecified site: Secondary | ICD-10-CM

## 2018-10-31 ENCOUNTER — Ambulatory Visit (INDEPENDENT_AMBULATORY_CARE_PROVIDER_SITE_OTHER): Payer: Medicare Other

## 2018-10-31 VITALS — BP 122/80 | HR 72 | Temp 98.1°F | Resp 14 | Ht 61.5 in | Wt 130.1 lb

## 2018-10-31 DIAGNOSIS — Z0000 Encounter for general adult medical examination without abnormal findings: Secondary | ICD-10-CM | POA: Diagnosis not present

## 2018-10-31 DIAGNOSIS — I10 Essential (primary) hypertension: Secondary | ICD-10-CM | POA: Diagnosis not present

## 2018-10-31 LAB — COMPREHENSIVE METABOLIC PANEL WITH GFR
ALT: 17 U/L (ref 0–35)
AST: 21 U/L (ref 0–37)
Albumin: 4.3 g/dL (ref 3.5–5.2)
Alkaline Phosphatase: 59 U/L (ref 39–117)
BUN: 22 mg/dL (ref 6–23)
CO2: 25 meq/L (ref 19–32)
Calcium: 9.5 mg/dL (ref 8.4–10.5)
Chloride: 107 meq/L (ref 96–112)
Creatinine, Ser: 0.73 mg/dL (ref 0.40–1.20)
GFR: 83.91 mL/min
Glucose, Bld: 95 mg/dL (ref 70–99)
Potassium: 4.2 meq/L (ref 3.5–5.1)
Sodium: 139 meq/L (ref 135–145)
Total Bilirubin: 0.5 mg/dL (ref 0.2–1.2)
Total Protein: 6.9 g/dL (ref 6.0–8.3)

## 2018-10-31 NOTE — Patient Instructions (Addendum)
  [name] , Thank you for taking time to come for your Medicare Wellness Visit. I appreciate your ongoing commitment to your health goals. Please review the following plan we discussed and let me know if I can assist you in the future.   Lab today.  Follow up as needed.    Bring a copy of your [place] and/or Living Will to be scanned into chart.  Have a great day!  These are the goals we discussed: Goals      Patient Stated   . DIET - INCREASE WATER INTAKE (pt-stated)     Stay hydrated    . Increase physical activity (pt-stated)     Stay active        This is a list of the screening recommended for you and due dates:  Health Maintenance  Topic Date Due  . Mammogram  03/21/2019  . Colon Cancer Screening  11/24/2020  . Tetanus Vaccine  12/24/2020  . Flu Shot  Completed  . DEXA scan (bone density measurement)  Completed  .  Hepatitis C: One time screening is recommended by Center for Disease Control  (CDC) for  adults born from [birth year] through 1967.   Completed  . Pneumonia vaccines  Completed

## 2018-10-31 NOTE — Progress Notes (Addendum)
Subjective:   [name] is a 68 y.o. female who presents for Medicare Annual (Subsequent) preventive examination.  Review of Systems:  No ROS.  Medicare Wellness Visit. Additional risk factors are reflected in the social history. Cardiac Risk Factors include: advanced age (>10men, >66 women);hypertension     Objective:     Vitals: BP 122/80 (BP Location: Left Arm, Patient Position: Sitting, Cuff Size: Normal)   Pulse 72   Temp 98.1 F (36.7 C) (Oral)   Resp 14   Ht 5' 1.5" (1.562 m)   Wt 130 lb 1.9 oz (59 kg)   SpO2 98%   BMI 24.19 kg/m   Body mass index is 24.19 kg/m.  Advanced Directives 10/31/2018 10/26/2017 10/12/2016 10/15/2015  Does Patient Have a Medical Advance Directive? Yes Yes Yes Yes  Type of Advance Directive Living will;Healthcare Power of [place];Living will [place];Living will [place];Living will  Does patient want to make changes to medical advance directive? No - Patient declined No - Patient declined No - Patient declined Yes - information given  Copy of [place] in Chart? No - copy requested No - copy requested No - copy requested No - copy requested    Tobacco Social History   Tobacco Use  Smoking Status Never Smoker  Smokeless Tobacco Never Used     Counseling given: Not Answered   Clinical Intake:  Pre-visit preparation completed: Yes  Pain : No/denies pain     Diabetes: No  How often do you need to have someone help you when you read instructions, pamphlets, or other written materials from your doctor or pharmacy?: 1 - Never  Interpreter Needed?: No     Past Medical History:  Diagnosis Date  . Adenomatous polyps 2010   colon  . Allergy   . Arthritis   . Chicken pox   . GERD (gastroesophageal reflux disease)   . Hyperlipidemia   . Hypertension   . Pneumonia    Past Surgical History:  Procedure Laterality Date  . SPINE SURGERY       Family History  Problem Relation Age of Onset  . Cancer Mother   . Stroke Mother   . Heart disease Mother   . Hypertension Mother   . Diabetes Mother   . Breast cancer Mother 76  . Heart disease Father   . Hypertension Father    Social History   Socioeconomic History  . Marital status: Married    Spouse name: Not on file  . Number of children: Not on file  . Years of education: Not on file  . Highest education level: Not on file  Occupational History  . Not on file  Social Needs  . Financial resource strain: Not hard at all  . Food insecurity:    Worry: Never true    Inability: Never true  . Transportation needs:    Medical: No    Non-medical: No  Tobacco Use  . Smoking status: Never Smoker  . Smokeless tobacco: Never Used  Substance and Sexual Activity  . Alcohol use: Yes  . Drug use: No  . Sexual activity: Yes  Lifestyle  . Physical activity:    Days per week: Not on file    Minutes per session: Not on file  . Stress: Not at all  Relationships  . Social connections:    Talks on phone: Not on file    Gets together: Not on file  Attends religious service: Not on file    Active member of club or organization: Not on file    Attends meetings of clubs or organizations: Not on file    Relationship status: Not on file  Other Topics Concern  . Not on file  Social History Narrative  . Not on file    Outpatient Encounter Medications as of 10/31/2018  Medication Sig  . Azelastine-Fluticasone (DYMISTA NA) Place 2 sprays into the nose daily as needed.  . ezetimibe (ZETIA) 10 MG tablet Take 1 tablet (10 mg total) by mouth daily.  . famotidine (PEPCID) 20 MG tablet TAKE 1 TABLET BY MOUTH TWICE DAILY  . lisinopril (PRINIVIL,ZESTRIL) 5 MG tablet TAKE 1 TABLET BY MOUTH DAILY  . mupirocin ointment (BACTROBAN) 2 % Place 1 application into the nose 2 (two) times daily. As needed  . triamcinolone (NASACORT) 55 MCG/ACT AERO nasal inhaler Place 2 sprays into the nose as  needed.  . [DISCONTINUED] Calcium Carbonate (CALTRATE 600 PO) Take 1 tablet by mouth daily. Reported on 10/07/2015  . [DISCONTINUED] doxycycline (VIBRA-TABS) 100 MG tablet Take 1 tablet (100 mg total) by mouth 2 (two) times daily.   No facility-administered encounter medications on file as of 10/31/2018.     Activities of Daily Living In your present state of health, do you have any difficulty performing the following activities: 10/31/2018  Hearing? Y  Vision? N  Difficulty concentrating or making decisions? N  Comment Notes difficulty concentrating/focusing on more than 1 thing at a time  Walking or climbing stairs? N  Dressing or bathing? N  Doing errands, shopping? N  Preparing Food and eating ? N  Using the Toilet? N  In the past six months, have you accidently leaked urine? N  Do you have problems with loss of bowel control? N  Managing your Medications? N  Managing your Finances? N  Housekeeping or managing your Housekeeping? N  Some recent data might be hidden    Patient Care Team: [name], MD as PCP - General (Internal Medicine)    Assessment:   This is a routine wellness examination for [name].  [facility] lab for zetia follow up completed this visit, per verbal order. States she has been monitoring her blood pressure; averaging below 120/70.    Reports she did not have implants placed with endodontist; bridge only.  Sinuses have improved.   Health Screenings  Mammogram -03/20/18 Colonoscopy-11/24/17 Bone Density-07/18/18 Glaucoma-none reported Cholesterol-06/06/18 (245) Dental- every 6 months Vision- annual visits   Hearing-notes she has difficulty hearing conversational tones in a crowd.  Reads lips as needed. Little to no trouble when speaking with an individual. Previously seen by Audiologist; hearing aids not yet recommended.    Social  Alcohol intake-yes Smoking history- none Smokers in home?none Illicit drug use?none Exercise-skiing, walking,  Diet-rich  in calcium. States she has difficulty finding foods that Monitors closely Sexually Active-yes Multiple Partners-no  Safety  Patient feels safe at home.  Patient does have smoke detectors at home  Patient does wear sunscreen or protective clothing when in direct sunlight. Patient does wear seat belt when driving or riding with others.   Activities of Daily Living Patient can do their own household chores. Denies needing assistance with: driving, feeding themselves, getting from bed to chair, getting to the toilet, bathing/showering, dressing, managing money, climbing flight of stairs, or preparing meals.   Depression Screen Patient denies losing interest in daily life, feeling hopeless, or crying easily over simple problems.   Fall  Screen Patient denies being afraid of falling or falling in the last year.   Memory Screen Patient denies problems with memory, misplacing items, and is able to balance checkbook/bank accounts.  Patient is alert, normal appearance, oriented to person/place/and time. Correctly identified the president of the [place], recall of 3/3 objects, and performing simple calculations.  Patient displays appropriate judgement and can read correct time from watch face.  Notes her focus and concentration has changed in the way of finding it more difficult to process multiple things; easily distracted.   Immunizations The following Immunizations are up to date: Influenza, shingles, pneumonia, and tetanus.   Other Providers Patient Care Team: [name], MD as PCP - General (Internal Medicine)  Exercise Activities and Dietary recommendations Current Exercise Habits: Home exercise routine, Type of exercise: walking;treadmill, Time (Minutes): 30, Frequency (Times/Week): 5, Weekly Exercise (Minutes/Week): 150, Intensity: Mild  Goals      Patient Stated   . DIET - INCREASE WATER INTAKE (pt-stated)     Stay hydrated    . Increase physical activity (pt-stated)     Stay  active        Fall Risk Fall Risk  10/31/2018 10/26/2017 10/12/2016 10/15/2015 10/01/2014  Falls in the past year? 0 No No No No   Depression Screen PHQ 2/9 Scores 10/31/2018 10/26/2017 10/12/2016 10/15/2015  PHQ - 2 Score 0 0 0 0  PHQ- 9 Score - 0 - -     Cognitive Function MMSE - Mini Mental State Exam 10/26/2017 10/15/2015  Orientation to time 5 5  Orientation to Place 5 5  Registration 3 3  Attention/ Calculation 5 5  Recall 3 3  Language- name 2 objects 2 2  Language- repeat 1 1  Language- follow 3 step command 3 3  Language- read & follow direction 1 1  Write a sentence 1 1  Copy design 1 1  Total score 30 30     6CIT Screen 10/31/2018 10/12/2016  What Year? 0 points 0 points  What month? 0 points 0 points  What time? 0 points 0 points  Count back from 20 0 points 0 points  Months in reverse 0 points 0 points  Repeat phrase 0 points -  Total Score 0 -    Immunization History  Administered Date(s) Administered  . Influenza Split 07/26/2012, 08/27/2013  . Influenza, High Dose Seasonal PF 08/25/2015, 08/15/2016  . Influenza-Unspecified 08/29/2014, 08/01/2017, 08/07/2018  . Pneumococcal Conjugate-13 09/02/2014  . Pneumococcal Polysaccharide-23 06/27/2007, 10/07/2015  . Tdap 12/25/2010  . Zoster 09/26/2013  . Zoster Recombinat (Shingrix) 12/11/2017, 04/21/2018   Screening Tests Health Maintenance  Topic Date Due  . MAMMOGRAM  03/21/2019  . COLONOSCOPY  11/24/2020  . TETANUS/TDAP  12/24/2020  . INFLUENZA VACCINE  Completed  . DEXA SCAN  Completed  . Hepatitis C Screening  Completed  . PNA vac Low Risk Adult  Completed      Plan:   End of life planning; Advance aging; Advanced directives discussed. Copy of current HCPOA/Living Will requested.    I have personally reviewed and noted the following in the patient's chart:   . Medical and social history . Use of alcohol, tobacco or illicit drugs  . Current medications and supplements . Functional ability and  status . Nutritional status . Physical activity . Advanced directives . List of other physicians . Hospitalizations, surgeries, and ER visits in previous 12 months . Vitals . Screenings to include cognitive, depression, and falls . Referrals and appointments  In  addition, I have reviewed and discussed with patient certain preventive protocols, quality metrics, and best practice recommendations. A written personalized care plan for preventive services as well as general preventive health recommendations were provided to patient.     OBrien-Blaney, Yamil Dougher L, LPN  12/25/7581    I have reviewed the above information and agree with above.   Deborra Medina, MD

## 2018-11-02 ENCOUNTER — Other Ambulatory Visit (INDEPENDENT_AMBULATORY_CARE_PROVIDER_SITE_OTHER): Payer: Medicare Other

## 2018-11-02 ENCOUNTER — Other Ambulatory Visit: Payer: Self-pay | Admitting: Internal Medicine

## 2018-11-02 ENCOUNTER — Other Ambulatory Visit: Payer: Self-pay | Admitting: Radiology

## 2018-11-02 DIAGNOSIS — E785 Hyperlipidemia, unspecified: Secondary | ICD-10-CM

## 2018-11-02 DIAGNOSIS — E7800 Pure hypercholesterolemia, unspecified: Secondary | ICD-10-CM | POA: Diagnosis not present

## 2018-11-02 LAB — LIPID PANEL
Cholesterol: 203 mg/dL — ABNORMAL HIGH (ref 0–200)
HDL: 59.8 mg/dL (ref >39.00)
LDL Cholesterol: 125 mg/dL — ABNORMAL HIGH (ref 0–99)
NonHDL: 142.86
Total CHOL/HDL Ratio: 3
Triglycerides: 87 mg/dL (ref 0.0–149.0)
VLDL: 17.4 mg/dL (ref 0.0–40.0)

## 2018-11-02 NOTE — Progress Notes (Signed)
Patient requested lipid panel but it was not ordered by Lauren so now she has to return    Can you give her a fasting labs appt?

## 2018-11-02 NOTE — Addendum Note (Signed)
Addended by: Arby Barrette on: 11/02/2018 03:09 PM   Modules accepted: Orders

## 2018-11-02 NOTE — Addendum Note (Signed)
Addended by: Arby Barrette on: 11/02/2018 03:15 PM   Modules accepted: Orders

## 2018-12-21 ENCOUNTER — Other Ambulatory Visit: Payer: Self-pay | Admitting: Internal Medicine

## 2019-01-16 ENCOUNTER — Other Ambulatory Visit: Payer: Self-pay | Admitting: Internal Medicine

## 2019-01-16 MED ORDER — AMLODIPINE BESYLATE 2.5 MG PO TABS: 2.5000 mg | ORAL_TABLET | Freq: Every day | ORAL | 3 refills | Status: DC

## 2019-01-16 NOTE — Telephone Encounter (Signed)
Lisinopril changed to amlodipine  MyChart message sent

## 2019-01-16 NOTE — Telephone Encounter (Signed)
Copied from [place] [id number]. Topic: Quick Communication - Rx Refill/Question >> Jan 16, 2019 12:43 PM Bea Graff, NT wrote: Medication: lisinopril (PRINIVIL,ZESTRIL) 5 MG tablet  Has the patient contacted their pharmacy? Yes.   (Agent: If no, request that the patient contact the pharmacy for the refill.) (Agent: If yes, when and what did the pharmacy advise?)  Preferred Pharmacy (with phone number or street name): [place], [place] [phone] (Phone) [phone] (Fax)    Agent: Please be advised that RX refills may take up to 3 business days. We ask that you follow-up with your pharmacy.

## 2019-01-16 NOTE — Telephone Encounter (Signed)
Pt is requesting a refill of her lisinopril.

## 2019-03-07 ENCOUNTER — Other Ambulatory Visit: Payer: Self-pay | Admitting: Internal Medicine

## 2019-03-13 ENCOUNTER — Other Ambulatory Visit: Payer: Self-pay | Admitting: Internal Medicine

## 2019-03-13 DIAGNOSIS — Z1231 Encounter for screening mammogram for malignant neoplasm of breast: Secondary | ICD-10-CM

## 2019-03-20 ENCOUNTER — Other Ambulatory Visit: Payer: Self-pay | Admitting: Internal Medicine

## 2019-03-20 DIAGNOSIS — R21 Rash and other nonspecific skin eruption: Secondary | ICD-10-CM

## 2019-03-20 MED ORDER — NYSTATIN 100000 UNIT/GM EX POWD: Freq: Two times a day (BID) | CUTANEOUS | 0 refills | Status: DC

## 2019-03-20 NOTE — Assessment & Plan Note (Signed)
Noticed by husband. Nystatin sent to [facility]

## 2019-03-21 ENCOUNTER — Other Ambulatory Visit: Payer: Self-pay | Admitting: Internal Medicine

## 2019-03-21 MED ORDER — DOXYCYCLINE HYCLATE 100 MG PO TABS: 100.0000 mg | ORAL_TABLET | Freq: Two times a day (BID) | ORAL | 0 refills | Status: DC

## 2019-03-24 ENCOUNTER — Other Ambulatory Visit: Payer: Self-pay | Admitting: Internal Medicine

## 2019-03-24 DIAGNOSIS — R1032 Left lower quadrant pain: Secondary | ICD-10-CM

## 2019-03-25 ENCOUNTER — Telehealth: Payer: Self-pay | Admitting: Internal Medicine

## 2019-03-25 ENCOUNTER — Other Ambulatory Visit: Payer: Self-pay

## 2019-03-25 ENCOUNTER — Ambulatory Visit
Admission: RE | Admit: 2019-03-25 | Discharge: 2019-03-25 | Disposition: A | Discharge status: Home / Self Care | Payer: Medicare Other | Source: Ambulatory Visit | Attending: Internal Medicine | Admitting: Internal Medicine

## 2019-03-25 DIAGNOSIS — N201 Calculus of ureter: Secondary | ICD-10-CM

## 2019-03-25 DIAGNOSIS — N132 Hydronephrosis with renal and ureteral calculous obstruction: Secondary | ICD-10-CM | POA: Diagnosis not present

## 2019-03-25 DIAGNOSIS — R1032 Left lower quadrant pain: Secondary | ICD-10-CM | POA: Diagnosis not present

## 2019-03-25 MED ORDER — TAMSULOSIN HCL 0.4 MG PO CAPS: 0.4000 mg | ORAL_CAPSULE | Freq: Every day | ORAL | 3 refills | Status: DC

## 2019-03-25 MED ORDER — HYDROCODONE-ACETAMINOPHEN 10-325 MG PO TABS: 1.0000 | ORAL_TABLET | Freq: Four times a day (QID) | ORAL | 0 refills | Status: DC | PRN

## 2019-03-25 NOTE — Addendum Note (Signed)
Addended by: [name] on: 03/25/2019 03:45 PM   Modules accepted: Orders

## 2019-03-25 NOTE — Telephone Encounter (Signed)
Shawn, CT Tech at [facility] called a report of CT Renal, result read back and verified in Epic, he says the patient is still there and what to do with her. I called the office and spoke to Plato, [facility], she placed me on hold to find out from Dr. Derrel Nip, Gae Bon advised to let the patient go and they will call her with the results. I advised Shawn of the above, he verbalized understanding.

## 2019-03-25 NOTE — Telephone Encounter (Signed)
Patient notified that she has a large obstructing stone in the left ureter that is causing the left kidney to swell.  She is aware that I am ordering an urgent referral to [facility]  bc I think she will need lithotripsy,  Please call them ASAP

## 2019-03-26 ENCOUNTER — Encounter: Payer: Self-pay | Admitting: Urology

## 2019-03-26 ENCOUNTER — Ambulatory Visit
Admission: RE | Admit: 2019-03-26 | Discharge: 2019-03-26 | Disposition: A | Discharge status: Home / Self Care | Payer: Medicare Other | Source: Ambulatory Visit | Attending: Urology | Admitting: Urology

## 2019-03-26 ENCOUNTER — Other Ambulatory Visit: Payer: Self-pay | Admitting: Radiology

## 2019-03-26 ENCOUNTER — Ambulatory Visit (INDEPENDENT_AMBULATORY_CARE_PROVIDER_SITE_OTHER): Payer: Medicare Other | Admitting: Urology

## 2019-03-26 ENCOUNTER — Other Ambulatory Visit
Admission: RE | Admit: 2019-03-26 | Discharge: 2019-03-26 | Disposition: A | Discharge status: Home / Self Care | Payer: Medicare Other | Source: Ambulatory Visit | Attending: Urology | Admitting: Urology

## 2019-03-26 VITALS — BP 146/69 | HR 87 | Ht 61.75 in | Wt 129.5 lb

## 2019-03-26 DIAGNOSIS — N202 Calculus of kidney with calculus of ureter: Secondary | ICD-10-CM | POA: Diagnosis not present

## 2019-03-26 DIAGNOSIS — N132 Hydronephrosis with renal and ureteral calculous obstruction: Secondary | ICD-10-CM | POA: Diagnosis not present

## 2019-03-26 DIAGNOSIS — Z1159 Encounter for screening for other viral diseases: Secondary | ICD-10-CM | POA: Insufficient documentation

## 2019-03-26 DIAGNOSIS — N201 Calculus of ureter: Secondary | ICD-10-CM | POA: Diagnosis not present

## 2019-03-26 DIAGNOSIS — N23 Unspecified renal colic: Secondary | ICD-10-CM | POA: Diagnosis not present

## 2019-03-26 DIAGNOSIS — N200 Calculus of kidney: Secondary | ICD-10-CM

## 2019-03-26 NOTE — Progress Notes (Signed)
03/26/2019 9:14 AM   [name] 05-24-1951 [id number]  Referring provider: [name], MD [address] [place], [place] [address]  Chief Complaint  Patient presents with  . Nephrolithiasis    HPI: 68 year old female seen at the request of Dr. Derrel Nip for evaluation of a left proximal ureteral calculus.  She presents with a 3-day history of left flank pain radiating to the left lower quadrant.  Her pain was initially severe without identifiable precipitating, aggravating or alleviating factors.  She denies fever, chills.  She has nausea without vomiting.  Dr. Derrel Nip ordered a stone protocol CT yesterday which showed a 5 x 9 mm left proximal ureteral calculus with moderate hydronephrosis/hydroureter.  She also has nonobstructing lower pole left renal calculi and a 3 mm calculus at the left UVJ.  Her pain has been intermittent however she has been taking ibuprofen only.  A prescription for hydrocodone only was sent yesterday however she has not taken.  She is on tamsulosin.  She has a prior history of recurrent stone disease previously treated with shockwave lithotripsy.   PMH: Past Medical History:  Diagnosis Date  . Adenomatous polyps 2010   colon  . Allergy   . Arthritis   . Chicken pox   . GERD (gastroesophageal reflux disease)   . Hyperlipidemia   . Hypertension   . Kidney stone   . Pneumonia     Surgical History: Past Surgical History:  Procedure Laterality Date  . SPINE SURGERY      Home Medications:  Allergies as of 03/26/2019      Reactions   Contrast Media [iodinated Diagnostic Agents] Swelling   Iodine Swelling   Milk-related Compounds Diarrhea   Prednisolone Anxiety   Agitation to oral prednisone tapers    Corn-containing Products    headaches   Penicillins    Childhood does not know reaction   Sulfa Antibiotics Hives      Medication List       Accurate as of March 26, 2019  9:14 AM. If you have any questions, ask your nurse or  doctor.        amLODipine 2.5 MG tablet Commonly known as:  NORVASC Take 1 tablet (2.5 mg total) by mouth at bedtime.   doxycycline 100 MG tablet Commonly known as:  VIBRA-TABS Take 1 tablet (100 mg total) by mouth 2 (two) times daily.   DYMISTA NA Place 2 sprays into the nose daily as needed.   ezetimibe 10 MG tablet Commonly known as:  ZETIA TAKE 1 TABLET BY MOUTH ONCE DAILY   famotidine 20 MG tablet Commonly known as:  PEPCID TAKE 1 TABLET BY MOUTH TWICE DAILY   HYDROcodone-acetaminophen 10-325 MG tablet Commonly known as:  NORCO Take 1 tablet by mouth every 6 (six) hours as needed.   hydrocortisone-pramoxine 2.5-1 % rectal cream Commonly known as:  ANALPRAM-HC Apply to affected area up to 3 times daily   meloxicam 7.5 MG tablet Commonly known as:  MOBIC   mupirocin ointment 2 % Commonly known as:  BACTROBAN Place 1 application into the nose 2 (two) times daily. As needed   nystatin powder Commonly known as:  MYCOSTATIN/NYSTOP Apply topically 2 (two) times daily.   tamsulosin 0.4 MG Caps capsule Commonly known as:  FLOMAX Take 1 capsule (0.4 mg total) by mouth daily.   triamcinolone 55 MCG/ACT Aero nasal inhaler Commonly known as:  NASACORT Place 2 sprays into the nose as needed.       Allergies:  Allergies  Allergen Reactions  . Contrast Media [Iodinated Diagnostic Agents] Swelling  . Iodine Swelling  . Milk-Related Compounds Diarrhea  . Prednisolone Anxiety    Agitation to oral prednisone tapers   . Corn-Containing Products     headaches  . Penicillins     Childhood does not know reaction  . Sulfa Antibiotics Hives    Family History: Family History  Problem Relation Age of Onset  . Cancer Mother   . Stroke Mother   . Heart disease Mother   . Hypertension Mother   . Diabetes Mother   . Breast cancer Mother 62  . Heart disease Father   . Hypertension Father     Social History:  reports that she has never smoked. She has never used  smokeless tobacco. She reports current alcohol use. She reports that she does not use drugs.  ROS: UROLOGY Frequent Urination?: Yes Hard to postpone urination?: No Burning/pain with urination?: Yes Get up at night to urinate?: No Leakage of urine?: No Urine stream starts and stops?: No Trouble starting stream?: No Do you have to strain to urinate?: No Blood in urine?: No Urinary tract infection?: No Sexually transmitted disease?: No Injury to kidneys or bladder?: No Painful intercourse?: No Weak stream?: No Currently pregnant?: No Vaginal bleeding?: No Last menstrual period?: Postmenopausal  Gastrointestinal Nausea?: Yes Vomiting?: No Indigestion/heartburn?: No Diarrhea?: No Constipation?: No  Constitutional Fever: No Night sweats?: No Weight loss?: No Fatigue?: No  Skin Skin rash/lesions?: No Itching?: No  Eyes Blurred vision?: No Double vision?: No  Ears/Nose/Throat Sore throat?: No Sinus problems?: Yes  Hematologic/Lymphatic Swollen glands?: No Easy bruising?: No  Cardiovascular Leg swelling?: No Chest pain?: No  Respiratory Cough?: No Shortness of breath?: No  Endocrine Excessive thirst?: No  Musculoskeletal Back pain?: No Joint pain?: No  Neurological Headaches?: No Dizziness?: No  Psychologic Depression?: No Anxiety?: No  Physical Exam: BP (!) 146/69 (BP Location: Left Arm, Patient Position: Sitting, Cuff Size: Normal)   Pulse 87   Ht 5' 1.75" (1.568 m)   Wt 129 lb 8 oz (58.7 kg)   BMI 23.88 kg/m   Constitutional:  Alert and oriented, No acute distress. HEENT: [place] AT, moist mucus membranes.  Trachea midline, no masses. Cardiovascular: No clubbing, cyanosis, or edema. Respiratory: Normal respiratory effort, no increased work of breathing. GI: Abdomen is soft, nontender, nondistended, no abdominal masses GU: No CVA tenderness Lymph: No cervical or inguinal lymphadenopathy. Skin: No rashes, bruises or suspicious lesions.  Neurologic: Grossly intact, no focal deficits, moving all 4 extremities. Psychiatric: Normal mood and affect.  Pertinent Imaging: CT was personally reviewed  Results for orders placed during the hospital encounter of 03/25/19  CT RENAL STONE STUDY   Narrative CLINICAL DATA:  Left flank pain for 3 days.  EXAM: CT ABDOMEN AND PELVIS WITHOUT CONTRAST  TECHNIQUE: Multidetector CT imaging of the abdomen and pelvis was performed following the standard protocol without IV contrast.  COMPARISON:  Abdominopelvic CT 12/21/2010.  FINDINGS: Lower chest: Clear lung bases. No significant pleural or pericardial effusion.  Hepatobiliary: A cyst inferiorly in the right hepatic lobe has mildly enlarged, measuring 17 mm on image 22/2. No other focal hepatic abnormalities on noncontrast imaging. No evidence of gallstones, gallbladder wall thickening or biliary dilatation.  Pancreas: Unremarkable. No pancreatic ductal dilatation or surrounding inflammatory changes.  Spleen: Normal in size without focal abnormality.  Adrenals/Urinary Tract: Both adrenal glands appear normal. There is bilateral nephrolithiasis. Largest calculus is in the lower pole of the left  kidney, measuring 6 mm on image 36/2. There is left-sided hydronephrosis and hydroureter secondary to an obstructing calculus in the proximal ureter, measuring 5 mm on image 39/2. This measures up to 9 mm on sagittal image 96/6 and is not well seen on the scout image due to overlap with the spinal hardware. In addition, there is a 3 mm calculus at the left ureterovesical junction (image 69/2). No evidence of right-sided hydronephrosis or ureteral calculus.  Stomach/Bowel: No evidence of bowel wall thickening, distention or surrounding inflammatory change.  Vascular/Lymphatic: There are no enlarged abdominal or pelvic lymph nodes. Minimal aortic and branch vessel atherosclerosis.  Reproductive: The uterus and ovaries appear normal. No  adnexal mass.  Other: Tiny umbilical hernia containing only fat.  No ascites.  Musculoskeletal: No acute or significant osseous findings. Interval interbody and left pedicle screw fusion at L5-S1.  IMPRESSION: 1. Mild left-sided hydronephrosis and hydroureter secondary to obstructing calculi in the proximal and distal left ureter, measuring up to 9 mm in diameter. 2. Bilateral nephrolithiasis. 3. Minimal Aortic Atherosclerosis (ICD10-I70.0).   Electronically Signed   By: Richardean Sale M.D.   On: 03/25/2019 13:11     Assessment & Plan:   68 year old female with a 5 x 9 mm left proximal ureteral calculus with moderate hydronephrosis/hydroureter and intermittent renal colic.  She has a 3 mm stone at the UVJ which should pass.  Treatment options of her proximal calculus were discussed including shockwave lithotripsy and ureteroscopy.  The pros and cons of each treatment were discussed in detail including observation with or without medical expulsive therapy, shockwave lithotripsy (SWL), ureteroscopy and laser lithotripsy with stent placement.  We discussed that management is based on stone size, location, density, patient co-morbidities, and patient preference.   Stones <31m m in size have a >80% spontaneous passage rate. Data surrounding the use of tamsulosin for medical expulsive therapy is controversial, but meta analyses suggests it is most efficacious for distal stones between 5-60mm in size. Possible side effects include dizziness/lightheadedness.  SWL has a lower stone free rate in a single procedure, but also a lower complication rate compared to ureteroscopy and avoids a stent and associated stent related symptoms. Possible complications include renal hematoma, steinstrasse, and need for additional treatment.  Ureteroscopy with laser lithotripsy and stent placement has a higher stone free rate than SWL in a single procedure, however increased complication rate including possible  infection, ureteral injury, bleeding, and stent related morbidity. Common stent related symptoms include dysuria, urgency/frequency, and flank pain.  After an extensive discussion of the risks and benefits of the above treatment options, the patient would like to proceed with shockwave lithotripsy.  The indications and nature of the planned procedure were discussed as well as the potential benefits and expected outcome.  Alternatives were discussed as described above.  The most common complications and side effects were discussed as outlined in the [facility] consent form.  It was stressed that there is no guarantee that lithotripsy will be successful and she could require retreatment or alternative treatment.  The rare instance of perirenal bleeding requiring hospitalization, transfusion and rarely surgery were discussed.  The possibility of renal colic from obstructing stone fragments requiring stent placement or ureteroscopy was also discussed.  She indicated all questions were answered and desires to proceed.  She was instructed not to take NSAIDs including ibuprofen or ASA-containing products.  For pain I have recommended taking the hydrocodone or Tylenol if she does not need narcotic analgesia.  Her stone  could not be visualized on CT scout due to spinal hardware and a KUB with obliques was ordered.   Abbie Sons, [place] [address], [place], [place] [address] [phone]

## 2019-03-26 NOTE — H&P (View-Only) (Signed)
03/26/2019 9:14 AM   [name] 12/14/1950 [id number]  Referring provider: [name], MD [place], [place] [address]  Chief Complaint  Patient presents with  . Nephrolithiasis    HPI: 68 year old female seen at the request of Dr. Derrel Nip for evaluation of a left proximal ureteral calculus.  She presents with a 3-day history of left flank pain radiating to the left lower quadrant.  Her pain was initially severe without identifiable precipitating, aggravating or alleviating factors.  She denies fever, chills.  She has nausea without vomiting.  Dr. Derrel Nip ordered a stone protocol CT yesterday which showed a 5 x 9 mm left proximal ureteral calculus with moderate hydronephrosis/hydroureter.  She also has nonobstructing lower pole left renal calculi and a 3 mm calculus at the left UVJ.  Her pain has been intermittent however she has been taking ibuprofen only.  A prescription for hydrocodone only was sent yesterday however she has not taken.  She is on tamsulosin.  She has a prior history of recurrent stone disease previously treated with shockwave lithotripsy.   PMH: Past Medical History:  Diagnosis Date  . Adenomatous polyps 2010   colon  . Allergy   . Arthritis   . Chicken pox   . GERD (gastroesophageal reflux disease)   . Hyperlipidemia   . Hypertension   . Kidney stone   . Pneumonia     Surgical History: Past Surgical History:  Procedure Laterality Date  . SPINE SURGERY      Home Medications:  Allergies as of 03/26/2019      Reactions   Contrast Media [iodinated Diagnostic Agents] Swelling   Iodine Swelling   Milk-related Compounds Diarrhea   Prednisolone Anxiety   Agitation to oral prednisone tapers    Corn-containing Products    headaches   Penicillins    Childhood does not know reaction   Sulfa Antibiotics Hives      Medication List       Accurate as of March 26, 2019  9:14 AM. If you have any questions, ask your nurse or  doctor.        amLODipine 2.5 MG tablet Commonly known as:  NORVASC Take 1 tablet (2.5 mg total) by mouth at bedtime.   doxycycline 100 MG tablet Commonly known as:  VIBRA-TABS Take 1 tablet (100 mg total) by mouth 2 (two) times daily.   DYMISTA NA Place 2 sprays into the nose daily as needed.   ezetimibe 10 MG tablet Commonly known as:  ZETIA TAKE 1 TABLET BY MOUTH ONCE DAILY   famotidine 20 MG tablet Commonly known as:  PEPCID TAKE 1 TABLET BY MOUTH TWICE DAILY   HYDROcodone-acetaminophen 10-325 MG tablet Commonly known as:  NORCO Take 1 tablet by mouth every 6 (six) hours as needed.   hydrocortisone-pramoxine 2.5-1 % rectal cream Commonly known as:  ANALPRAM-HC Apply to affected area up to 3 times daily   meloxicam 7.5 MG tablet Commonly known as:  MOBIC   mupirocin ointment 2 % Commonly known as:  BACTROBAN Place 1 application into the nose 2 (two) times daily. As needed   nystatin powder Commonly known as:  MYCOSTATIN/NYSTOP Apply topically 2 (two) times daily.   tamsulosin 0.4 MG Caps capsule Commonly known as:  FLOMAX Take 1 capsule (0.4 mg total) by mouth daily.   triamcinolone 55 MCG/ACT Aero nasal inhaler Commonly known as:  NASACORT Place 2 sprays into the nose as needed.       Allergies:  Allergies  Allergen Reactions  . Contrast Media [Iodinated Diagnostic Agents] Swelling  . Iodine Swelling  . Milk-Related Compounds Diarrhea  . Prednisolone Anxiety    Agitation to oral prednisone tapers   . Corn-Containing Products     headaches  . Penicillins     Childhood does not know reaction  . Sulfa Antibiotics Hives    Family History: Family History  Problem Relation Age of Onset  . Cancer Mother   . Stroke Mother   . Heart disease Mother   . Hypertension Mother   . Diabetes Mother   . Breast cancer Mother 77  . Heart disease Father   . Hypertension Father     Social History:  reports that she has never smoked. She has never used  smokeless tobacco. She reports current alcohol use. She reports that she does not use drugs.  ROS: UROLOGY Frequent Urination?: Yes Hard to postpone urination?: No Burning/pain with urination?: Yes Get up at night to urinate?: No Leakage of urine?: No Urine stream starts and stops?: No Trouble starting stream?: No Do you have to strain to urinate?: No Blood in urine?: No Urinary tract infection?: No Sexually transmitted disease?: No Injury to kidneys or bladder?: No Painful intercourse?: No Weak stream?: No Currently pregnant?: No Vaginal bleeding?: No Last menstrual period?: Postmenopausal  Gastrointestinal Nausea?: Yes Vomiting?: No Indigestion/heartburn?: No Diarrhea?: No Constipation?: No  Constitutional Fever: No Night sweats?: No Weight loss?: No Fatigue?: No  Skin Skin rash/lesions?: No Itching?: No  Eyes Blurred vision?: No Double vision?: No  Ears/Nose/Throat Sore throat?: No Sinus problems?: Yes  Hematologic/Lymphatic Swollen glands?: No Easy bruising?: No  Cardiovascular Leg swelling?: No Chest pain?: No  Respiratory Cough?: No Shortness of breath?: No  Endocrine Excessive thirst?: No  Musculoskeletal Back pain?: No Joint pain?: No  Neurological Headaches?: No Dizziness?: No  Psychologic Depression?: No Anxiety?: No  Physical Exam: BP (!) 146/69 (BP Location: Left Arm, Patient Position: Sitting, Cuff Size: Normal)   Pulse 87   Ht 5' 1.75" (1.568 m)   Wt 129 lb 8 oz (58.7 kg)   BMI 23.88 kg/m   Constitutional:  Alert and oriented, No acute distress. HEENT: [place] AT, moist mucus membranes.  Trachea midline, no masses. Cardiovascular: No clubbing, cyanosis, or edema. Respiratory: Normal respiratory effort, no increased work of breathing. GI: Abdomen is soft, nontender, nondistended, no abdominal masses GU: No CVA tenderness Lymph: No cervical or inguinal lymphadenopathy. Skin: No rashes, bruises or suspicious lesions.  Neurologic: Grossly intact, no focal deficits, moving all 4 extremities. Psychiatric: Normal mood and affect.  Pertinent Imaging: CT was personally reviewed  Results for orders placed during the hospital encounter of 03/25/19  CT RENAL STONE STUDY   Narrative CLINICAL DATA:  Left flank pain for 3 days.  EXAM: CT ABDOMEN AND PELVIS WITHOUT CONTRAST  TECHNIQUE: Multidetector CT imaging of the abdomen and pelvis was performed following the standard protocol without IV contrast.  COMPARISON:  Abdominopelvic CT 12/21/2010.  FINDINGS: Lower chest: Clear lung bases. No significant pleural or pericardial effusion.  Hepatobiliary: A cyst inferiorly in the right hepatic lobe has mildly enlarged, measuring 17 mm on image 22/2. No other focal hepatic abnormalities on noncontrast imaging. No evidence of gallstones, gallbladder wall thickening or biliary dilatation.  Pancreas: Unremarkable. No pancreatic ductal dilatation or surrounding inflammatory changes.  Spleen: Normal in size without focal abnormality.  Adrenals/Urinary Tract: Both adrenal glands appear normal. There is bilateral nephrolithiasis. Largest calculus is in the lower pole of the left  kidney, measuring 6 mm on image 36/2. There is left-sided hydronephrosis and hydroureter secondary to an obstructing calculus in the proximal ureter, measuring 5 mm on image 39/2. This measures up to 9 mm on sagittal image 96/6 and is not well seen on the scout image due to overlap with the spinal hardware. In addition, there is a 3 mm calculus at the left ureterovesical junction (image 69/2). No evidence of right-sided hydronephrosis or ureteral calculus.  Stomach/Bowel: No evidence of bowel wall thickening, distention or surrounding inflammatory change.  Vascular/Lymphatic: There are no enlarged abdominal or pelvic lymph nodes. Minimal aortic and branch vessel atherosclerosis.  Reproductive: The uterus and ovaries appear normal. No  adnexal mass.  Other: Tiny umbilical hernia containing only fat.  No ascites.  Musculoskeletal: No acute or significant osseous findings. Interval interbody and left pedicle screw fusion at L5-S1.  IMPRESSION: 1. Mild left-sided hydronephrosis and hydroureter secondary to obstructing calculi in the proximal and distal left ureter, measuring up to 9 mm in diameter. 2. Bilateral nephrolithiasis. 3. Minimal Aortic Atherosclerosis (ICD10-I70.0).   Electronically Signed   By: Richardean Sale M.D.   On: 03/25/2019 13:11     Assessment & Plan:   68 year old female with a 5 x 9 mm left proximal ureteral calculus with moderate hydronephrosis/hydroureter and intermittent renal colic.  She has a 3 mm stone at the UVJ which should pass.  Treatment options of her proximal calculus were discussed including shockwave lithotripsy and ureteroscopy.  The pros and cons of each treatment were discussed in detail including observation with or without medical expulsive therapy, shockwave lithotripsy (SWL), ureteroscopy and laser lithotripsy with stent placement.  We discussed that management is based on stone size, location, density, patient co-morbidities, and patient preference.   Stones <24m m in size have a >80% spontaneous passage rate. Data surrounding the use of tamsulosin for medical expulsive therapy is controversial, but meta analyses suggests it is most efficacious for distal stones between 5-11mm in size. Possible side effects include dizziness/lightheadedness.  SWL has a lower stone free rate in a single procedure, but also a lower complication rate compared to ureteroscopy and avoids a stent and associated stent related symptoms. Possible complications include renal hematoma, steinstrasse, and need for additional treatment.  Ureteroscopy with laser lithotripsy and stent placement has a higher stone free rate than SWL in a single procedure, however increased complication rate including possible  infection, ureteral injury, bleeding, and stent related morbidity. Common stent related symptoms include dysuria, urgency/frequency, and flank pain.  After an extensive discussion of the risks and benefits of the above treatment options, the patient would like to proceed with shockwave lithotripsy.  The indications and nature of the planned procedure were discussed as well as the potential benefits and expected outcome.  Alternatives were discussed as described above.  The most common complications and side effects were discussed as outlined in the [facility] consent form.  It was stressed that there is no guarantee that lithotripsy will be successful and she could require retreatment or alternative treatment.  The rare instance of perirenal bleeding requiring hospitalization, transfusion and rarely surgery were discussed.  The possibility of renal colic from obstructing stone fragments requiring stent placement or ureteroscopy was also discussed.  She indicated all questions were answered and desires to proceed.  She was instructed not to take NSAIDs including ibuprofen or ASA-containing products.  For pain I have recommended taking the hydrocodone or Tylenol if she does not need narcotic analgesia.  Her stone  could not be visualized on CT scout due to spinal hardware and a KUB with obliques was ordered.   Abbie Sons, [place] [address], [place], [place] [address] [phone]

## 2019-03-26 NOTE — Telephone Encounter (Signed)
She is scheduled today at 8:30 with Dr. Bernardo Heater. She is aware. Melissa

## 2019-03-26 NOTE — Addendum Note (Signed)
Addended by: Donalee Citrin on: 03/26/2019 10:37 AM   Modules accepted: Orders

## 2019-03-27 LAB — NOVEL CORONAVIRUS, NAA (HOSP ORDER, SEND-OUT TO REF LAB; TAT 18-24 HRS): SARS-CoV-2, NAA: NOT DETECTED

## 2019-03-28 ENCOUNTER — Encounter: Payer: Self-pay | Admitting: Emergency Medicine

## 2019-03-28 ENCOUNTER — Encounter
Admission: RE | Disposition: A | Discharge status: Home / Self Care | Payer: Self-pay | Source: Home / Self Care | Attending: Urology

## 2019-03-28 ENCOUNTER — Ambulatory Visit: Payer: Medicare Other

## 2019-03-28 ENCOUNTER — Other Ambulatory Visit: Payer: Self-pay

## 2019-03-28 ENCOUNTER — Ambulatory Visit
Admission: RE | Admit: 2019-03-28 | Discharge: 2019-03-28 | Disposition: A | Discharge status: Home / Self Care | Payer: Medicare Other | Attending: Urology | Admitting: Urology

## 2019-03-28 DIAGNOSIS — Z79899 Other long term (current) drug therapy: Secondary | ICD-10-CM | POA: Insufficient documentation

## 2019-03-28 DIAGNOSIS — I10 Essential (primary) hypertension: Secondary | ICD-10-CM | POA: Diagnosis not present

## 2019-03-28 DIAGNOSIS — M1990 Unspecified osteoarthritis, unspecified site: Secondary | ICD-10-CM | POA: Insufficient documentation

## 2019-03-28 DIAGNOSIS — Z791 Long term (current) use of non-steroidal anti-inflammatories (NSAID): Secondary | ICD-10-CM | POA: Diagnosis not present

## 2019-03-28 DIAGNOSIS — N201 Calculus of ureter: Secondary | ICD-10-CM | POA: Diagnosis not present

## 2019-03-28 DIAGNOSIS — E785 Hyperlipidemia, unspecified: Secondary | ICD-10-CM | POA: Diagnosis not present

## 2019-03-28 DIAGNOSIS — N132 Hydronephrosis with renal and ureteral calculous obstruction: Secondary | ICD-10-CM | POA: Diagnosis not present

## 2019-03-28 HISTORY — PX: EXTRACORPOREAL SHOCK WAVE LITHOTRIPSY: SHX1557

## 2019-03-28 LAB — CULTURE, URINE COMPREHENSIVE

## 2019-03-28 SURGERY — LITHOTRIPSY, ESWL
Anesthesia: Moderate Sedation | Laterality: Left

## 2019-03-28 MED ORDER — CIPROFLOXACIN HCL 500 MG PO TABS
500.0000 mg | ORAL_TABLET | ORAL | Status: AC
  Administered 2019-03-28: 500 mg via ORAL

## 2019-03-28 MED ORDER — CIPROFLOXACIN HCL 500 MG PO TABS
ORAL_TABLET | ORAL | Status: AC
  Administered 2019-03-28: 07:00:00 500 mg via ORAL
  Filled 2019-03-28: qty 1

## 2019-03-28 MED ORDER — ONDANSETRON HCL 4 MG/2ML IJ SOLN
INTRAMUSCULAR | Status: AC
  Administered 2019-03-28: 07:00:00 4 mg via INTRAVENOUS
  Filled 2019-03-28: qty 2

## 2019-03-28 MED ORDER — DIPHENHYDRAMINE HCL 25 MG PO CAPS
ORAL_CAPSULE | ORAL | Status: AC
  Administered 2019-03-28: 07:00:00 25 mg via ORAL
  Filled 2019-03-28: qty 1

## 2019-03-28 MED ORDER — DIPHENHYDRAMINE HCL 25 MG PO CAPS
25.0000 mg | ORAL_CAPSULE | ORAL | Status: AC
  Administered 2019-03-28: 07:00:00 25 mg via ORAL

## 2019-03-28 MED ORDER — DIAZEPAM 5 MG PO TABS
10.0000 mg | ORAL_TABLET | ORAL | Status: AC
  Administered 2019-03-28: 07:00:00 10 mg via ORAL

## 2019-03-28 MED ORDER — ONDANSETRON HCL 4 MG/2ML IJ SOLN
4.0000 mg | Freq: Once | INTRAMUSCULAR | Status: AC | PRN
  Administered 2019-03-28: 4 mg via INTRAVENOUS

## 2019-03-28 MED ORDER — ONDANSETRON HCL 2 MG/ML IV SOLN: 4.0000 mg | Freq: Once | INTRAVENOUS | Status: DC | PRN

## 2019-03-28 MED ORDER — DIAZEPAM 5 MG PO TABS
ORAL_TABLET | ORAL | Status: AC
  Administered 2019-03-28: 07:00:00 10 mg via ORAL
  Filled 2019-03-28: qty 2

## 2019-03-28 MED ORDER — SODIUM CHLORIDE 0.9 % IV SOLN
INTRAVENOUS | Status: DC
  Administered 2019-03-28: 07:00:00 via INTRAVENOUS

## 2019-03-28 NOTE — Interval H&P Note (Signed)
History and Physical Interval Note:  03/28/2019 8:04 AM  [name]  has presented today for surgery, with the diagnosis of Left Proximal Ureteral Calculus.  The various methods of treatment have been discussed with the patient and family. After consideration of risks, benefits and other options for treatment, the patient has consented to  Procedure(s): EXTRACORPOREAL SHOCK WAVE LITHOTRIPSY (ESWL) (Left) as a surgical intervention.  The patient's history has been reviewed, patient examined, no change in status, stable for surgery.  I have reviewed the patient's chart and labs.  Questions were answered to the patient's satisfaction.    RR[facility] CTAB  [name]

## 2019-03-28 NOTE — Discharge Instructions (Signed)
See [facility] discharge instructions in chart.  AMBULATORY SURGERY  DISCHARGE INSTRUCTIONS   1) The drugs that you were given will stay in your system until tomorrow so for the next 24 hours you should not:  A) Drive an automobile B) Make any legal decisions C) Drink any alcoholic beverage   2) You may resume regular meals tomorrow.  Today it is better to start with liquids and gradually work up to solid foods.  You may eat anything you prefer, but it is better to start with liquids, then soup and crackers, and gradually work up to solid foods.   3) Please notify your doctor immediately if you have any unusual bleeding, trouble breathing, redness and pain at the surgery site, drainage, fever, or pain not relieved by medication.    4) Additional Instructions:        Please contact your physician with any problems or Same Day Surgery at [phone], Monday through Friday 6 am to 4 pm, or [place] [facility] number at [phone].  

## 2019-04-02 ENCOUNTER — Telehealth: Payer: Self-pay | Admitting: Urology

## 2019-04-02 NOTE — Telephone Encounter (Signed)
Pt. States she is having lots of cramping at night, she passed a fragment last night but the cramping is constant at night. ESWL(03/28/19), she is using a heating pad and Tylenol for pain. Pt. States she contacted [facility] and they suggest she have a KUB this week and a sooner follow up appointment in our office. Pt thinks she may also have a UTI and would like to speak to clinical for advice.

## 2019-04-02 NOTE — Telephone Encounter (Signed)
Spoke with patient and she states she passed a little bit of the stone this afternoon. She states she will hold off for a couple days to see more fragments come out before doing the UA, KUB.

## 2019-04-11 ENCOUNTER — Ambulatory Visit
Admission: RE | Admit: 2019-04-11 | Discharge: 2019-04-11 | Disposition: A | Discharge status: Home / Self Care | Payer: Medicare Other | Source: Ambulatory Visit | Attending: Internal Medicine | Admitting: Internal Medicine

## 2019-04-11 ENCOUNTER — Other Ambulatory Visit: Payer: Self-pay | Admitting: Internal Medicine

## 2019-04-11 ENCOUNTER — Other Ambulatory Visit: Payer: Self-pay

## 2019-04-11 DIAGNOSIS — Z1231 Encounter for screening mammogram for malignant neoplasm of breast: Secondary | ICD-10-CM | POA: Diagnosis not present

## 2019-04-16 ENCOUNTER — Ambulatory Visit
Admission: RE | Admit: 2019-04-16 | Discharge: 2019-04-16 | Disposition: A | Discharge status: Home / Self Care | Payer: Medicare Other | Source: Ambulatory Visit | Attending: Urology | Admitting: Urology

## 2019-04-16 ENCOUNTER — Encounter: Payer: Self-pay | Admitting: Urology

## 2019-04-16 ENCOUNTER — Ambulatory Visit (INDEPENDENT_AMBULATORY_CARE_PROVIDER_SITE_OTHER): Payer: Medicare Other | Admitting: Urology

## 2019-04-16 ENCOUNTER — Other Ambulatory Visit: Payer: Self-pay

## 2019-04-16 ENCOUNTER — Other Ambulatory Visit: Payer: Self-pay | Admitting: Urology

## 2019-04-16 VITALS — BP 131/81 | HR 75 | Ht 61.75 in | Wt 122.0 lb

## 2019-04-16 DIAGNOSIS — N201 Calculus of ureter: Secondary | ICD-10-CM | POA: Diagnosis not present

## 2019-04-16 DIAGNOSIS — N202 Calculus of kidney with calculus of ureter: Secondary | ICD-10-CM | POA: Diagnosis not present

## 2019-04-16 DIAGNOSIS — N200 Calculus of kidney: Secondary | ICD-10-CM | POA: Diagnosis not present

## 2019-04-16 NOTE — Progress Notes (Signed)
04/16/2019 8:16 AM   [name] Mar 15, 1951 [id number]  Referring provider: [name], MD [place],  [place] [address]  Chief Complaint  Patient presents with  . Nephrolithiasis    HPI: 68 year old female presents for postop follow-up.  She underwent shockwave lithotripsy on 03/28/2019 of a ureteral calculus that had migrated to the distal ureter.  She had no postoperative problems and brings in several fragments today.  KUB performed earlier this morning was reviewed and there are no fragments identified.  She has left renal calculi.   PMH: Past Medical History:  Diagnosis Date  . Adenomatous polyps 2010   colon  . Allergy   . Arthritis   . Chicken pox   . GERD (gastroesophageal reflux disease)   . Hyperlipidemia   . Hypertension   . Kidney stone   . Pneumonia     Surgical History: Past Surgical History:  Procedure Laterality Date  . EXTRACORPOREAL SHOCK WAVE LITHOTRIPSY Left 03/28/2019   Procedure: EXTRACORPOREAL SHOCK WAVE LITHOTRIPSY (ESWL);  Surgeon: [name], MD;  Location: [facility] ORS;  Service: Urology;  Laterality: Left;  . SPINE SURGERY      Home Medications:  Allergies as of 04/16/2019      Reactions   Contrast Media [iodinated Diagnostic Agents] Swelling   Iodine Swelling   Milk-related Compounds Diarrhea   Prednisolone Anxiety   Agitation to oral prednisone tapers    Corn-containing Products    headaches   Penicillins    Childhood does not know reaction   Sulfa Antibiotics Hives      Medication List       Accurate as of April 16, 2019  8:16 AM. If you have any questions, ask your nurse or doctor.        STOP taking these medications   doxycycline 100 MG tablet Commonly known as: VIBRA-TABS Stopped by: Abbie Sons, MD   HYDROcodone-acetaminophen 10-325 MG tablet Commonly known as: NORCO Stopped by: Abbie Sons, MD   hydrocortisone-pramoxine 2.5-1 % rectal cream Commonly known as:  ANALPRAM-HC Stopped by: Abbie Sons, MD   nystatin powder Commonly known as: MYCOSTATIN/NYSTOP Stopped by: Abbie Sons, MD     TAKE these medications   amLODipine 2.5 MG tablet Commonly known as: NORVASC Take 1 tablet (2.5 mg total) by mouth at bedtime.   DYMISTA NA Place 2 sprays into the nose daily as needed.   ezetimibe 10 MG tablet Commonly known as: ZETIA TAKE 1 TABLET BY MOUTH ONCE DAILY   famotidine 20 MG tablet Commonly known as: PEPCID TAKE 1 TABLET BY MOUTH TWICE DAILY   meloxicam 7.5 MG tablet Commonly known as: MOBIC   mupirocin ointment 2 % Commonly known as: BACTROBAN Place 1 application into the nose 2 (two) times daily. As needed   tamsulosin 0.4 MG Caps capsule Commonly known as: FLOMAX Take 1 capsule (0.4 mg total) by mouth daily.   triamcinolone 55 MCG/ACT Aero nasal inhaler Commonly known as: NASACORT Place 2 sprays into the nose as needed.       Allergies:  Allergies  Allergen Reactions  . Contrast Media [Iodinated Diagnostic Agents] Swelling  . Iodine Swelling  . Milk-Related Compounds Diarrhea  . Prednisolone Anxiety    Agitation to oral prednisone tapers   . Corn-Containing Products     headaches  . Penicillins     Childhood does not know reaction  . Sulfa Antibiotics Hives    Family History: Family History  Problem Relation  Age of Onset  . Cancer Mother   . Stroke Mother   . Heart disease Mother   . Hypertension Mother   . Diabetes Mother   . Breast cancer Mother 52  . Heart disease Father   . Hypertension Father     Social History:  reports that she has never smoked. She has never used smokeless tobacco. She reports current alcohol use. She reports that she does not use drugs.  ROS: UROLOGY Frequent Urination?: Yes Hard to postpone urination?: No Burning/pain with urination?: No Get up at night to urinate?: No Leakage of urine?: No Urine stream starts and stops?: No Trouble starting stream?: No Do you  have to strain to urinate?: No Blood in urine?: No Urinary tract infection?: No Sexually transmitted disease?: No Injury to kidneys or bladder?: No Painful intercourse?: No Weak stream?: No Currently pregnant?: No Vaginal bleeding?: No Last menstrual period?: n  Gastrointestinal Nausea?: No Vomiting?: No Indigestion/heartburn?: No Diarrhea?: No Constipation?: No  Constitutional Fever: No Night sweats?: No Weight loss?: No Fatigue?: No  Skin Skin rash/lesions?: No Itching?: No  Eyes Blurred vision?: No Double vision?: No  Ears/Nose/Throat Sore throat?: No Sinus problems?: No  Hematologic/Lymphatic Swollen glands?: No Easy bruising?: No  Cardiovascular Leg swelling?: No Chest pain?: No  Respiratory Cough?: No Shortness of breath?: No  Endocrine Excessive thirst?: No  Musculoskeletal Back pain?: No Joint pain?: No  Neurological Headaches?: No Dizziness?: No  Psychologic Depression?: No Anxiety?: No  Physical Exam: BP 131/81   Pulse 75   Ht 5' 1.75" (1.568 m)   Wt 122 lb (55.3 kg)   BMI 22.50 kg/m   Constitutional:  Alert and oriented, No acute distress.   Assessment & Plan:   Good result status post shockwave lithotripsy of a left distal ureteral calculus.  Stone fragments were sent for analysis.  She has bilateral nephrolithiasis on CT.  She would like to observe her left lower pole renal calculi and will schedule a 25-month follow-up with KUB.  I recommended a metabolic evaluation via [facility] and she will be notified with the results.   Abbie Sons, [place] [address], [place], [place] [address] [phone]

## 2019-04-19 ENCOUNTER — Other Ambulatory Visit: Payer: Self-pay | Admitting: Urology

## 2019-04-30 ENCOUNTER — Telehealth: Payer: Self-pay

## 2019-04-30 NOTE — Telephone Encounter (Signed)
-----   Message from Abbie Sons, MD sent at 04/30/2019 12:10 PM EDT ----- Please let patient know stone analysis was primarily calcium oxalate.  We had discussed a metabolic evaluation/[facility] at her last visit ----- Message ----- From: Delman Cheadle Sent: 04/19/2019   1:10 PM EDT To: Abbie Sons, MD

## 2019-04-30 NOTE — Telephone Encounter (Signed)
Left pt a message and sent mychart notification sent

## 2019-05-01 ENCOUNTER — Other Ambulatory Visit: Payer: Self-pay

## 2019-05-01 ENCOUNTER — Other Ambulatory Visit: Payer: Medicare Other

## 2019-05-01 DIAGNOSIS — N200 Calculus of kidney: Secondary | ICD-10-CM | POA: Diagnosis not present

## 2019-05-12 ENCOUNTER — Other Ambulatory Visit: Payer: Self-pay | Admitting: Internal Medicine

## 2019-05-12 DIAGNOSIS — Z20822 Contact with and (suspected) exposure to covid-19: Secondary | ICD-10-CM

## 2019-05-12 DIAGNOSIS — Z20828 Contact with and (suspected) exposure to other viral communicable diseases: Secondary | ICD-10-CM

## 2019-05-13 DIAGNOSIS — Z20828 Contact with and (suspected) exposure to other viral communicable diseases: Secondary | ICD-10-CM | POA: Diagnosis not present

## 2019-05-17 LAB — NOVEL CORONAVIRUS, NAA: SARS-CoV-2, NAA: NOT DETECTED

## 2019-06-06 ENCOUNTER — Telehealth: Payer: Self-pay | Admitting: Urology

## 2019-06-06 ENCOUNTER — Other Ambulatory Visit: Payer: Self-pay | Admitting: Urology

## 2019-06-06 NOTE — Telephone Encounter (Signed)
Litholink is scanned in. Please review and advise

## 2019-06-06 NOTE — Telephone Encounter (Signed)
Pt called and would like her results from her 24 hr urine results.

## 2019-06-07 NOTE — Telephone Encounter (Signed)
Her most significant abnormality was low urine volume at 1.43 L.  Would recommend increasing her water intake to keep urine output >2-2.5 L/day.  Typically 3 L intake will accomplish this output.  She also had a low urine pH and borderline low citrate.  Would recommend  Litho-lyte water supplement.  Blood work was normal

## 2019-06-07 NOTE — Telephone Encounter (Signed)
Patient notified and voiced understanding.

## 2019-06-21 ENCOUNTER — Telehealth: Payer: Self-pay

## 2019-06-21 NOTE — Telephone Encounter (Signed)
Copied from [place] [id number]. Topic: General - Other >> Jun 21, 2019  3:30 PM Celene Kras A wrote: Reason for CRM: Pts husband called stating pt had her mammogram on 04/11/2019 at the [facility]. Please add to record.   Done.  Nina,cma

## 2019-06-24 ENCOUNTER — Other Ambulatory Visit: Payer: Self-pay

## 2019-06-24 ENCOUNTER — Ambulatory Visit (INDEPENDENT_AMBULATORY_CARE_PROVIDER_SITE_OTHER): Payer: Medicare Other

## 2019-06-24 DIAGNOSIS — Z23 Encounter for immunization: Secondary | ICD-10-CM | POA: Diagnosis not present

## 2019-06-28 ENCOUNTER — Other Ambulatory Visit: Payer: Self-pay

## 2019-06-28 MED ORDER — EZETIMIBE 10 MG PO TABS: 10.0000 mg | ORAL_TABLET | Freq: Every day | ORAL | 1 refills | Status: DC

## 2019-08-16 ENCOUNTER — Encounter: Payer: Self-pay | Admitting: Urology

## 2019-08-16 ENCOUNTER — Ambulatory Visit (INDEPENDENT_AMBULATORY_CARE_PROVIDER_SITE_OTHER): Payer: Medicare Other | Admitting: Urology

## 2019-08-16 ENCOUNTER — Other Ambulatory Visit: Payer: Self-pay

## 2019-08-16 ENCOUNTER — Ambulatory Visit
Admission: RE | Admit: 2019-08-16 | Discharge: 2019-08-16 | Disposition: A | Discharge status: Home / Self Care | Payer: Medicare Other | Attending: Urology | Admitting: Urology

## 2019-08-16 ENCOUNTER — Ambulatory Visit
Admission: RE | Admit: 2019-08-16 | Discharge: 2019-08-16 | Disposition: A | Discharge status: Home / Self Care | Payer: Medicare Other | Source: Ambulatory Visit | Attending: Urology | Admitting: Urology

## 2019-08-16 VITALS — BP 130/75 | HR 80 | Ht 61.75 in | Wt 125.5 lb

## 2019-08-16 DIAGNOSIS — N200 Calculus of kidney: Secondary | ICD-10-CM

## 2019-08-16 DIAGNOSIS — K5900 Constipation, unspecified: Secondary | ICD-10-CM | POA: Diagnosis not present

## 2019-08-16 NOTE — Progress Notes (Signed)
08/16/2019 9:24 AM   [name] 07-27-1951 [id number]  Referring provider: [name], MD [address] [place],  [place] [address]  Chief Complaint  Patient presents with  . Nephrolithiasis    Urologic history: 1.  Bilateral nephrolithiasis -Shockwave lithotripsy 03/2019 right distal ureteral calculus -Bilateral nonobstructing renal calculi -Stone analysis 85% CaOxMono -Metabolic evaluation low urine volume 1.43 L and hypocitraturia   HPI: 68 y.o. female presents for routine follow-up of nephrolithiasis.  Since her last visit she denies flank or abdominal pain.  She has increased her water intake significantly and does note increased urinary frequency.  She is also using LithoLyte twice daily.   PMH: Past Medical History:  Diagnosis Date  . Adenomatous polyps 2010   colon  . Allergy   . Arthritis   . Chicken pox   . GERD (gastroesophageal reflux disease)   . Hyperlipidemia   . Hypertension   . Kidney stone   . Pneumonia     Surgical History: Past Surgical History:  Procedure Laterality Date  . EXTRACORPOREAL SHOCK WAVE LITHOTRIPSY Left 03/28/2019   Procedure: EXTRACORPOREAL SHOCK WAVE LITHOTRIPSY (ESWL);  Surgeon: [name], MD;  Location: [facility] ORS;  Service: Urology;  Laterality: Left;  . SPINE SURGERY      Home Medications:  Allergies as of 08/16/2019      Reactions   Contrast Media [iodinated Diagnostic Agents] Swelling   Iodine Swelling   Milk-related Compounds Diarrhea   Prednisolone Anxiety   Agitation to oral prednisone tapers    Corn-containing Products    headaches   Penicillins    Childhood does not know reaction   Sulfa Antibiotics Hives      Medication List       Accurate as of August 16, 2019  9:24 AM. If you have any questions, ask your nurse or doctor.        STOP taking these medications   DYMISTA NA Stopped by: Abbie Sons, MD   tamsulosin 0.4 MG Caps capsule Commonly known as: FLOMAX Stopped  by: Abbie Sons, MD     TAKE these medications   amLODipine 2.5 MG tablet Commonly known as: NORVASC Take 1 tablet (2.5 mg total) by mouth at bedtime.   ezetimibe 10 MG tablet Commonly known as: ZETIA Take 1 tablet (10 mg total) by mouth daily.   famotidine 20 MG tablet Commonly known as: PEPCID TAKE 1 TABLET BY MOUTH TWICE DAILY   meloxicam 7.5 MG tablet Commonly known as: MOBIC Take 7.5 mg by mouth as needed.   mupirocin ointment 2 % Commonly known as: BACTROBAN Place 1 application into the nose 2 (two) times daily. As needed   triamcinolone 55 MCG/ACT Aero nasal inhaler Commonly known as: NASACORT Place 2 sprays into the nose as needed.       Allergies:  Allergies  Allergen Reactions  . Contrast Media [Iodinated Diagnostic Agents] Swelling  . Iodine Swelling  . Milk-Related Compounds Diarrhea  . Prednisolone Anxiety    Agitation to oral prednisone tapers   . Corn-Containing Products     headaches  . Penicillins     Childhood does not know reaction  . Sulfa Antibiotics Hives    Family History: Family History  Problem Relation Age of Onset  . Cancer Mother   . Stroke Mother   . Heart disease Mother   . Hypertension Mother   . Diabetes Mother   . Breast cancer Mother 14  . Heart disease Father   .  Hypertension Father     Social History:  reports that she has never smoked. She has never used smokeless tobacco. She reports current alcohol use. She reports that she does not use drugs.  ROS: UROLOGY Frequent Urination?: Yes Hard to postpone urination?: No Burning/pain with urination?: No Get up at night to urinate?: Yes Leakage of urine?: No Urine stream starts and stops?: No Trouble starting stream?: No Do you have to strain to urinate?: No Blood in urine?: No Urinary tract infection?: No Sexually transmitted disease?: No Injury to kidneys or bladder?: No Painful intercourse?: No Weak stream?: No Currently pregnant?: No Vaginal bleeding?:  No Last menstrual period?: n  Gastrointestinal Nausea?: No Vomiting?: No Indigestion/heartburn?: No Diarrhea?: No Constipation?: No  Constitutional Fever: No Night sweats?: No Weight loss?: No Fatigue?: No  Skin Skin rash/lesions?: No Itching?: No  Eyes Blurred vision?: No Double vision?: No  Ears/Nose/Throat Sore throat?: No Sinus problems?: No  Hematologic/Lymphatic Swollen glands?: No Easy bruising?: No  Cardiovascular Leg swelling?: No Chest pain?: No  Respiratory Cough?: No Shortness of breath?: No  Endocrine Excessive thirst?: No  Musculoskeletal Back pain?: No Joint pain?: No  Neurological Headaches?: No Dizziness?: No  Psychologic Depression?: No Anxiety?: No  Physical Exam: BP 130/75 (BP Location: Left Arm, Patient Position: Sitting, Cuff Size: Normal)   Pulse 80   Ht 5' 1.75" (1.568 m)   Wt 125 lb 8 oz (56.9 kg)   BMI 23.14 kg/m   Constitutional:  Alert and oriented, No acute distress. HEENT: [place] AT, moist mucus membranes.  Trachea midline, no masses. Cardiovascular: No clubbing, cyanosis, or edema. Respiratory: Normal respiratory effort, no increased work of breathing. Neurologic: Grossly intact, no focal deficits, moving all 4 extremities. Psychiatric: Normal mood and affect.   Assessment & Plan:    - Nephrolithiasis Bilateral, nonobstructing renal calculi.  KUB was ordered and she will be notified with the results.  If stable recommend follow-up visit/KUB 1 year and to call earlier for recurrent renal colic.  Continue increased hydration and citrate supplement.  Greater than 50% of this 15-minute visit was spent counseling the patient.   Abbie Sons, [place] [address], [place], [place] [address] [phone]

## 2019-08-18 ENCOUNTER — Encounter: Payer: Self-pay | Admitting: Urology

## 2019-08-28 MED ORDER — EZETIMIBE 10 MG PO TABS: 10.0000 mg | ORAL_TABLET | Freq: Every day | ORAL | 1 refills | Status: DC

## 2019-09-18 ENCOUNTER — Other Ambulatory Visit: Payer: Self-pay

## 2019-10-15 MED ORDER — MELOXICAM 7.5 MG PO TABS: 7.5000 mg | ORAL_TABLET | ORAL | 1 refills | Status: DC | PRN

## 2019-10-21 ENCOUNTER — Other Ambulatory Visit: Payer: Self-pay | Admitting: Internal Medicine

## 2019-11-04 ENCOUNTER — Ambulatory Visit: Payer: Medicare Other | Admitting: Internal Medicine

## 2019-11-04 ENCOUNTER — Ambulatory Visit: Payer: Medicare Other

## 2019-11-06 ENCOUNTER — Encounter: Payer: Self-pay | Admitting: Internal Medicine

## 2019-11-06 ENCOUNTER — Ambulatory Visit (INDEPENDENT_AMBULATORY_CARE_PROVIDER_SITE_OTHER): Payer: Medicare Other

## 2019-11-06 ENCOUNTER — Other Ambulatory Visit: Payer: Self-pay

## 2019-11-06 ENCOUNTER — Ambulatory Visit (INDEPENDENT_AMBULATORY_CARE_PROVIDER_SITE_OTHER): Payer: Medicare Other | Admitting: Internal Medicine

## 2019-11-06 VITALS — BP 127/67 | HR 72 | Temp 97.5°F | Ht 61.75 in | Wt 122.0 lb

## 2019-11-06 VITALS — BP 120/65 | HR 67 | Temp 89.2°F | Ht 61.75 in | Wt 122.0 lb

## 2019-11-06 DIAGNOSIS — E559 Vitamin D deficiency, unspecified: Secondary | ICD-10-CM | POA: Diagnosis not present

## 2019-11-06 DIAGNOSIS — N200 Calculus of kidney: Secondary | ICD-10-CM

## 2019-11-06 DIAGNOSIS — Z0000 Encounter for general adult medical examination without abnormal findings: Secondary | ICD-10-CM

## 2019-11-06 DIAGNOSIS — I10 Essential (primary) hypertension: Secondary | ICD-10-CM | POA: Diagnosis not present

## 2019-11-06 DIAGNOSIS — R5383 Other fatigue: Secondary | ICD-10-CM | POA: Diagnosis not present

## 2019-11-06 DIAGNOSIS — E782 Mixed hyperlipidemia: Secondary | ICD-10-CM

## 2019-11-06 MED ORDER — MUPIROCIN 2 % EX OINT: 1.0000 "application " | TOPICAL_OINTMENT | Freq: Two times a day (BID) | CUTANEOUS | 5 refills | Status: DC

## 2019-11-06 NOTE — Progress Notes (Signed)
Virtual Visit via [email or web address]  This visit type was conducted due to national recommendations for restrictions regarding the COVID-19 pandemic (e.g. social distancing).  This format is felt to be most appropriate for this patient at this time.  All issues noted in this document were discussed and addressed.  No physical exam was performed (except for noted visual exam findings with Video Visits).   I connected with@ on 11/06/19 at 10:00 AM EST by a video enabled telemedicine application and verified that I am speaking with the correct person using two identifiers. Location patient: home Location provider: work or home office Persons participating in the virtual visit: patient, provider  I discussed the limitations, risks, security and privacy concerns of performing an evaluation and management service by telephone and the availability of in person appointments. I also discussed with the patient that there may be a patient responsible charge related to this service. The patient expressed understanding and agreed to proceed.  Reason for visit:  Follow up  HPI:  Still dealing with altered diet for recurrent nephrolithiasis. Having trouble getting in 1 liter daily  water bc doesn't like to have to get up to pee in the middle of the night.  Using Litholyte twice daily,  (potassium, magnesium and sodium)  Hates it.    She is self isolating at [facility],  She is being very careful and extremely anxious about running into people without masks.   Alarmed at the number of out of towners [place] Taking 1000 vitamin D daily  Husband Ed has received first dose of COVID 19 vaccine .    Patient is taking amlodipine as prescribed and notes no adverse effects.  Home BP readings have been done about once per week and are  generally < 130/80 .  She is avoiding added salt in her diet and walking regularly about 3 times per week for exercise  .  Using meloxicam prn neck pain ,   Shoulder pain   Taking zetia for hyperlipidemia.  Overdue for labs (annual)  Worried about daughter who can't get a vaccine yet ,  She  Chartered certified accountant students.    ROS: See pertinent positives and negatives per HPI.  Past Medical History:  Diagnosis Date  . Adenomatous polyps 2010   colon  . Allergy   . Arthritis   . Chicken pox   . GERD (gastroesophageal reflux disease)   . Hyperlipidemia   . Hypertension   . Kidney stone   . Pneumonia     Past Surgical History:  Procedure Laterality Date  . EXTRACORPOREAL SHOCK WAVE LITHOTRIPSY Left 03/28/2019   Procedure: EXTRACORPOREAL SHOCK WAVE LITHOTRIPSY (ESWL);  Surgeon: [name], MD;  Location: [facility] ORS;  Service: Urology;  Laterality: Left;  . SPINE SURGERY      Family History  Problem Relation Age of Onset  . Cancer Mother   . Stroke Mother   . Heart disease Mother   . Hypertension Mother   . Diabetes Mother   . Breast cancer Mother 77  . Heart disease Father   . Hypertension Father     SOCIAL HX:  reports that she has never smoked. She has never used smokeless tobacco. She reports current alcohol use. She reports that she does not use drugs.   Current Outpatient Medications:  .  amLODipine (NORVASC) 2.5 MG tablet, TAKE 1 TABLET AT BEDTIME, Disp: 90 tablet, Rfl: 2 .  cholecalciferol (VITAMIN D3) 25 MCG (1000  UNIT) tablet, Take 1,000 Units by mouth daily., Disp: , Rfl:  .  ezetimibe (ZETIA) 10 MG tablet, Take 1 tablet (10 mg total) by mouth daily., Disp: 90 tablet, Rfl: 1 .  famotidine (PEPCID) 20 MG tablet, TAKE 1 TABLET BY MOUTH TWICE DAILY, Disp: 180 tablet, Rfl: 3 .  meloxicam (MOBIC) 7.5 MG tablet, Take 1 tablet (7.5 mg total) by mouth as needed., Disp: 90 tablet, Rfl: 1 .  mupirocin ointment (BACTROBAN) 2 %, Place 1 application into the nose 2 (two) times daily. As needed, Disp: 22 g, Rfl: 5 .  triamcinolone (NASACORT) 55 MCG/ACT AERO nasal inhaler, Place 2 sprays into the nose as needed., Disp: , Rfl:    EXAM:  VITALS per patient if applicable:  GENERAL: alert, oriented, appears well and in no acute distress  HEENT: atraumatic, conjunttiva clear, no obvious abnormalities on inspection of external nose and ears  NECK: normal movements of the head and neck  LUNGS: on inspection no signs of respiratory distress, breathing rate appears normal, no obvious gross SOB, gasping or wheezing  CV: no obvious cyanosis  MS: moves all visible extremities without noticeable abnormality  PSYCH/NEURO: pleasant and cooperative, no obvious depression or anxiety, speech and thought processing grossly intact  ASSESSMENT AND PLAN:  Discussed the following assessment and plan:  No diagnosis found.  No problem-specific Assessment & Plan notes found for this encounter.    I discussed the assessment and treatment plan with the patient. The patient was provided an opportunity to ask questions and all were answered. The patient agreed with the plan and demonstrated an understanding of the instructions.   The patient was advised to call back or seek an in-person evaluation if the symptoms worsen or if the condition fails to improve as anticipated.  [name], MD

## 2019-11-06 NOTE — Progress Notes (Signed)
3 kidney stones on the right and two on the left. The two on the left are very low and not able to be blasted so the only way to get rid of them is to have surgery. Pt stated that they have her taking litholite right now but she feels it is to much for her.

## 2019-11-06 NOTE — Progress Notes (Signed)
Subjective:   [name] is a 69 y.o. female who presents for Medicare Annual (Subsequent) preventive examination.  Review of Systems:  No ROS.  Medicare Wellness Virtual Visit.  Visual/audio telehealth visit, UTA vital signs.   Wt/Ht provided. See social history for additional risk factors.   Cardiac Risk Factors include: advanced age (>1men, >80 women);hypertension     Objective:     Vitals: Ht 5' 1.75" (1.568 m)   Wt 125 lb (56.7 kg)   BMI 23.05 kg/m   Body mass index is 23.05 kg/m.  Advanced Directives 11/06/2019 03/28/2019 10/31/2018 10/26/2017 10/12/2016 10/15/2015  Does Patient Have a Medical Advance Directive? Yes Yes Yes Yes Yes Yes  Type of Paramedic of [place];Living will [place];Living will Living will;Healthcare Power of [place];Living will [place];Living will [place];Living will  Does patient want to make changes to medical advance directive? No - Patient declined No - Patient declined No - Patient declined No - Patient declined No - Patient declined Yes - information given  Copy of [place] in Chart? No - copy requested - No - copy requested No - copy requested No - copy requested No - copy requested    Tobacco Social History   Tobacco Use  Smoking Status Never Smoker  Smokeless Tobacco Never Used     Counseling given: Not Answered   Clinical Intake:  Pre-visit preparation completed: Yes        Diabetes: No  How often do you need to have someone help you when you read instructions, pamphlets, or other written materials from your doctor or pharmacy?: 1 - Never  Interpreter Needed?: No     Past Medical History:  Diagnosis Date  . Adenomatous polyps 2010   colon  . Allergy   . Arthritis   . Chicken pox   . GERD (gastroesophageal reflux disease)   . Hyperlipidemia   . Hypertension   . Kidney stone   .  Pneumonia    Past Surgical History:  Procedure Laterality Date  . EXTRACORPOREAL SHOCK WAVE LITHOTRIPSY Left 03/28/2019   Procedure: EXTRACORPOREAL SHOCK WAVE LITHOTRIPSY (ESWL);  Surgeon: [name], MD;  Location: [facility] ORS;  Service: Urology;  Laterality: Left;  . SPINE SURGERY     Family History  Problem Relation Age of Onset  . Cancer Mother   . Stroke Mother   . Heart disease Mother   . Hypertension Mother   . Diabetes Mother   . Breast cancer Mother 72  . Heart disease Father   . Hypertension Father    Social History   Socioeconomic History  . Marital status: Married    Spouse name: Not on file  . Number of children: Not on file  . Years of education: Not on file  . Highest education level: Not on file  Occupational History  . Not on file  Tobacco Use  . Smoking status: Never Smoker  . Smokeless tobacco: Never Used  Substance and Sexual Activity  . Alcohol use: Yes  . Drug use: No  . Sexual activity: Yes  Other Topics Concern  . Not on file  Social History Narrative  . Not on file   Social Determinants of Health   Financial Resource Strain:   . Difficulty of Paying Living Expenses: Not on file  Food Insecurity:   . Worried About Charity fundraiser in the Last Year: Not on file  .  Ran Out of Food in the Last Year: Not on file  Transportation Needs:   . Lack of Transportation (Medical): Not on file  . Lack of Transportation (Non-Medical): Not on file  Physical Activity:   . Days of Exercise per Week: Not on file  . Minutes of Exercise per Session: Not on file  Stress:   . Feeling of Stress : Not on file  Social Connections:   . Frequency of Communication with Friends and Family: Not on file  . Frequency of Social Gatherings with Friends and Family: Not on file  . Attends Religious Services: Not on file  . Active Member of Clubs or Organizations: Not on file  . Attends Archivist Meetings: Not on file  . Marital Status: Not on file     Outpatient Encounter Medications as of 11/06/2019  Medication Sig  . amLODipine (NORVASC) 2.5 MG tablet TAKE 1 TABLET AT BEDTIME  . ezetimibe (ZETIA) 10 MG tablet Take 1 tablet (10 mg total) by mouth daily.  . famotidine (PEPCID) 20 MG tablet TAKE 1 TABLET BY MOUTH TWICE DAILY  . meloxicam (MOBIC) 7.5 MG tablet Take 1 tablet (7.5 mg total) by mouth as needed.  . mupirocin ointment (BACTROBAN) 2 % Place 1 application into the nose 2 (two) times daily. As needed  . triamcinolone (NASACORT) 55 MCG/ACT AERO nasal inhaler Place 2 sprays into the nose as needed.   No facility-administered encounter medications on file as of 11/06/2019.    Activities of Daily Living In your present state of health, do you have any difficulty performing the following activities: 11/06/2019  Hearing? N  Vision? N  Difficulty concentrating or making decisions? N  Walking or climbing stairs? N  Dressing or bathing? N  Doing errands, shopping? N  Preparing Food and eating ? N  Using the Toilet? N  In the past six months, have you accidently leaked urine? N  Do you have problems with loss of bowel control? N  Managing your Medications? N  Managing your Finances? N  Housekeeping or managing your Housekeeping? N  Some recent data might be hidden    Patient Care Team: [name], MD as PCP - General (Internal Medicine)    Assessment:   This is a routine wellness examination for [name].  Nurse connected with patient 11/06/19 at  8:30 AM EST by a telephone enabled telemedicine application and verified that I am speaking with the correct person using two identifiers. Patient stated full name and DOB. Patient gave permission to continue with virtual visit. Patient's location was at home and Nurse's location was at [place] office.   Patient is alert and oriented x3. Patient denies difficulty focusing or concentrating. Patient likes to read for brain stimulation.   Health Maintenance Due: See completed  HM at the end of note.   Eye: Visual acuity not assessed. Virtual visit. Followed by their ophthalmologist.  Dental: UTD  Hearing: Demonstrates normal hearing during visit.  Safety:  Patient feels safe at home- yes Patient does have smoke detectors at home- yes Patient does wear sunscreen or protective clothing when in direct sunlight - yes Patient does wear seat belt when in a moving vehicle - yes Patient drives- yes Adequate lighting in walkways free from debris- yes Grab bars and handrails used as appropriate- yes Ambulates with an assistive device- no Cell phone on person when ambulating outside of the home- yes  Social: Alcohol intake - yes      Smoking history- never  Smokers in home? none Illicit drug use? none  Medication: Taking as directed and without issues.  Self managed - yes   Covid-19: Precautions and sickness symptoms discussed. Wears mask, social distancing, hand hygiene as appropriate.   Activities of Daily Living Patient denies needing assistance with: household chores, feeding themselves, getting from bed to chair, getting to the toilet, bathing/showering, dressing, managing money, or preparing meals.   Discussed the importance of a healthy diet, water intake and the benefits of aerobic exercise.  Educational material provided.  Physical activity- walking  Diet:  Modified diet; no wheat, spinach, seeds, high in calcium Water: good intake  Other Providers Patient Care Team: [name], MD as PCP - General (Internal Medicine)  Exercise Activities and Dietary recommendations Current Exercise Habits: Home exercise routine, Type of exercise: walking, Intensity: Moderate  Goals    . Follow up with Primary Care Provider     As needed       Fall Risk Fall Risk  11/06/2019 09/18/2019 10/31/2018 10/26/2017 10/12/2016  Falls in the past year? 0 0 0 No No  Comment - Emmi Telephone Survey: data to providers prior to load - - -  Follow up Falls  prevention discussed - - - -   Timed Get Up and Go performed: no, virtual visit  Depression Screen PHQ 2/9 Scores 11/06/2019 10/31/2018 10/26/2017 10/12/2016  PHQ - 2 Score 0 0 0 0  PHQ- 9 Score - - 0 -     Cognitive Function MMSE - Mini Mental State Exam 11/06/2019 10/26/2017 10/15/2015  Not completed: Unable to complete - -  Orientation to time - 5 5  Orientation to Place - 5 5  Registration - 3 3  Attention/ Calculation - 5 5  Recall - 3 3  Language- name 2 objects - 2 2  Language- repeat - 1 1  Language- follow 3 step command - 3 3  Language- read & follow direction - 1 1  Write a sentence - 1 1  Copy design - 1 1  Total score - 30 30     6CIT Screen 10/31/2018 10/12/2016  What Year? 0 points 0 points  What month? 0 points 0 points  What time? 0 points 0 points  Count back from 20 0 points 0 points  Months in reverse 0 points 0 points  Repeat phrase 0 points -  Total Score 0 -    Immunization History  Administered Date(s) Administered  . Fluad Quad(high Dose 65+) 06/24/2019  . Influenza Split 07/26/2012, 08/27/2013  . Influenza, High Dose Seasonal PF 08/25/2015, 08/15/2016, 07/15/2018  . Influenza-Unspecified 08/29/2014, 08/01/2017, 08/07/2018  . Pneumococcal Conjugate-13 09/02/2014  . Pneumococcal Polysaccharide-23 06/27/2007, 10/07/2015  . Tdap 12/25/2010  . Zoster 09/26/2013  . Zoster Recombinat (Shingrix) 12/11/2017, 04/21/2018   Screening Tests Health Maintenance  Topic Date Due  . MAMMOGRAM  04/10/2020  . COLONOSCOPY  11/24/2020  . TETANUS/TDAP  12/24/2020  . INFLUENZA VACCINE  Completed  . DEXA SCAN  Completed  . Hepatitis C Screening  Completed  . PNA vac Low Risk Adult  Completed        Plan:   Keep all routine maintenance appointments.   Follow up today @ 10:00.  Medicare Attestation I have personally reviewed: The patient's medical and social history Their use of alcohol, tobacco or illicit drugs Their current medications and  supplements The patient's functional ability including ADLs,fall risks, home safety risks, cognitive, and hearing and visual impairment Diet and physical activities Evidence for  depression   I have reviewed and discussed with patient certain preventive protocols, quality metrics, and best practice recommendations.     Varney Biles, LPN  [phone]

## 2019-11-06 NOTE — Patient Instructions (Addendum)
  [name] , Thank you for taking time to come for your Medicare Wellness Visit. I appreciate your ongoing commitment to your health goals. Please review the following plan we discussed and let me know if I can assist you in the future.   These are the goals we discussed: Goals    . Follow up with Primary Care Provider     As needed       This is a list of the screening recommended for you and due dates:  Health Maintenance  Topic Date Due  . Mammogram  04/10/2020  . Colon Cancer Screening  11/24/2020  . Tetanus Vaccine  12/24/2020  . Flu Shot  Completed  . DEXA scan (bone density measurement)  Completed  .  Hepatitis C: One time screening is recommended by Center for Disease Control  (CDC) for  adults born from [birth year] through 1967.   Completed  . Pneumonia vaccines  Completed

## 2019-11-07 NOTE — Assessment & Plan Note (Addendum)
Previous trial  of statin caused S/E of memory loss which subjectively improved with cessation.  Her ten year risk of CAD is 15%.  Tolerating Zetia    Lab Results  Component Value Date   CHOL 203 (H) 11/02/2018   HDL 59.80 11/02/2018   LDLCALC 125 (H) 11/02/2018   LDLDIRECT 173.0 06/06/2018   TRIG 87.0 11/02/2018   CHOLHDL 3 11/02/2018

## 2019-11-07 NOTE — Assessment & Plan Note (Signed)
Bilateral,  With recent lithotripsy for management of obstructing 9 mm ureteral stone on the left. She has asymptomatic stones remaining and struggles with increased water intake due to intolerance of  excessive urination

## 2019-11-07 NOTE — Assessment & Plan Note (Signed)
Well controlled on current regimen. Renal function is overdue , no changes today. 

## 2019-11-14 ENCOUNTER — Other Ambulatory Visit (INDEPENDENT_AMBULATORY_CARE_PROVIDER_SITE_OTHER): Payer: Medicare Other

## 2019-11-14 ENCOUNTER — Other Ambulatory Visit: Payer: Self-pay

## 2019-11-14 DIAGNOSIS — I10 Essential (primary) hypertension: Secondary | ICD-10-CM | POA: Diagnosis not present

## 2019-11-14 DIAGNOSIS — R5383 Other fatigue: Secondary | ICD-10-CM | POA: Diagnosis not present

## 2019-11-14 DIAGNOSIS — E782 Mixed hyperlipidemia: Secondary | ICD-10-CM | POA: Diagnosis not present

## 2019-11-14 DIAGNOSIS — E559 Vitamin D deficiency, unspecified: Secondary | ICD-10-CM

## 2019-11-14 LAB — LIPID PANEL
Cholesterol: 204 mg/dL — ABNORMAL HIGH (ref 0–200)
HDL: 61.2 mg/dL (ref >39.00)
LDL Cholesterol: 128 mg/dL — ABNORMAL HIGH (ref 0–99)
NonHDL: 142.57
Total CHOL/HDL Ratio: 3
Triglycerides: 75 mg/dL (ref 0.0–149.0)
VLDL: 15 mg/dL (ref 0.0–40.0)

## 2019-11-14 LAB — COMPREHENSIVE METABOLIC PANEL
ALT: 15 U/L (ref 0–35)
AST: 20 U/L (ref 0–37)
Albumin: 4.4 g/dL (ref 3.5–5.2)
Alkaline Phosphatase: 58 U/L (ref 39–117)
BUN: 14 mg/dL (ref 6–23)
CO2: 26 mEq/L (ref 19–32)
Calcium: 9.5 mg/dL (ref 8.4–10.5)
Chloride: 106 mEq/L (ref 96–112)
Creatinine, Ser: 0.83 mg/dL (ref 0.40–1.20)
GFR: 67.87 mL/min (ref >60.00)
Glucose, Bld: 92 mg/dL (ref 70–99)
Potassium: 4.1 mEq/L (ref 3.5–5.1)
Sodium: 141 mEq/L (ref 135–145)
Total Bilirubin: 0.6 mg/dL (ref 0.2–1.2)
Total Protein: 7.1 g/dL (ref 6.0–8.3)

## 2019-11-14 LAB — TSH: TSH: 3.52 u[IU]/mL (ref 0.35–4.50)

## 2019-11-14 LAB — VITAMIN D 25 HYDROXY (VIT D DEFICIENCY, FRACTURES): VITD: 28.23 ng/mL — ABNORMAL LOW (ref 30.00–100.00)

## 2019-11-19 ENCOUNTER — Ambulatory Visit: Payer: PRIVATE HEALTH INSURANCE

## 2019-11-21 ENCOUNTER — Encounter: Payer: Self-pay | Admitting: Internal Medicine

## 2019-11-21 ENCOUNTER — Other Ambulatory Visit: Payer: Self-pay

## 2019-11-21 ENCOUNTER — Ambulatory Visit (INDEPENDENT_AMBULATORY_CARE_PROVIDER_SITE_OTHER): Payer: Medicare Other | Admitting: Internal Medicine

## 2019-11-21 VITALS — BP 127/69 | Ht 62.0 in | Wt 122.0 lb

## 2019-11-21 DIAGNOSIS — E559 Vitamin D deficiency, unspecified: Secondary | ICD-10-CM | POA: Diagnosis not present

## 2019-11-21 DIAGNOSIS — T782XXD Anaphylactic shock, unspecified, subsequent encounter: Secondary | ICD-10-CM

## 2019-11-21 DIAGNOSIS — E785 Hyperlipidemia, unspecified: Secondary | ICD-10-CM

## 2019-11-21 MED ORDER — EPINEPHRINE 0.3 MG/0.3ML IJ SOAJ: 0.3000 mg | INTRAMUSCULAR | 1 refills | Status: DC | PRN

## 2019-11-21 NOTE — Patient Instructions (Addendum)
High Cholesterol  High cholesterol is a condition in which the blood has high levels of a white, waxy, fat-like substance (cholesterol). The human body needs small amounts of cholesterol. The liver makes all the cholesterol that the body needs. Extra (excess) cholesterol comes from the food that we eat. Cholesterol is carried from the liver by the blood through the blood vessels. If you have high cholesterol, deposits (plaques) may build up on the walls of your blood vessels (arteries). Plaques make the arteries narrower and stiffer. Cholesterol plaques increase your risk for heart attack and stroke. Work with your health care provider to keep your cholesterol levels in a healthy range. What increases the risk? This condition is more likely to develop in people who:  Eat foods that are high in animal fat (saturated fat) or cholesterol.  Are overweight.  Are not getting enough exercise.  Have a family history of high cholesterol. What are the signs or symptoms? There are no symptoms of this condition. How is this diagnosed? This condition may be diagnosed from the results of a blood test.  If you are older than age 20, your health care provider may check your cholesterol every 4-6 years.  You may be checked more often if you already have high cholesterol or other risk factors for heart disease. The blood test for cholesterol measures:  "Bad" cholesterol (LDL cholesterol). This is the main type of cholesterol that causes heart disease. The desired level for LDL is less than 100.  "Good" cholesterol (HDL cholesterol). This type helps to protect against heart disease by cleaning the arteries and carrying the LDL away. The desired level for HDL is 60 or higher.  Triglycerides. These are fats that the body can store or burn for energy. The desired number for triglycerides is lower than 150.  Total cholesterol. This is a measure of the total amount of cholesterol in your blood, including LDL  cholesterol, HDL cholesterol, and triglycerides. A healthy number is less than 200. How is this treated? This condition is treated with diet changes, lifestyle changes, and medicines. Diet changes  This may include eating more whole grains, fruits, vegetables, nuts, and fish.  This may also include cutting back on red meat and foods that have a lot of added sugar. Lifestyle changes  Changes may include getting at least 40 minutes of aerobic exercise 3 times a week. Aerobic exercises include walking, biking, and swimming. Aerobic exercise along with a healthy diet can help you maintain a healthy weight.  Changes may also include quitting smoking. Medicines  Medicines are usually given if diet and lifestyle changes have failed to reduce your cholesterol to healthy levels.  Your health care provider may prescribe a statin medicine. Statin medicines have been shown to reduce cholesterol, which can reduce the risk of heart disease. Follow these instructions at home: Eating and drinking If told by your health care provider:  Eat chicken (without skin), fish, veal, shellfish, ground turkey breast, and round or loin cuts of red meat.  Do not eat fried foods or fatty meats, such as hot dogs and salami.  Eat plenty of fruits, such as apples.  Eat plenty of vegetables, such as broccoli, potatoes, and carrots.  Eat beans, peas, and lentils.  Eat grains such as barley, rice, couscous, and bulgur wheat.  Eat pasta without cream sauces.  Use skim or nonfat milk, and eat low-fat or nonfat yogurt and cheeses.  Do not eat or drink whole milk, cream, ice cream, egg yolks,   or hard cheeses.  Do not eat stick margarine or tub margarines that contain trans fats (also called partially hydrogenated oils).  Do not eat saturated tropical oils, such as coconut oil and palm oil.  Do not eat cakes, cookies, crackers, or other baked goods that contain trans fats.  General instructions  Exercise as  directed by your health care provider. Increase your activity level with activities such as gardening, walking, and taking the stairs.  Take over-the-counter and prescription medicines only as told by your health care provider.  Do not use any products that contain nicotine or tobacco, such as cigarettes and e-cigarettes. If you need help quitting, ask your health care provider.  Keep all follow-up visits as told by your health care provider. This is important. Contact a health care provider if:  You are struggling to maintain a healthy diet or weight.  You need help to start on an exercise program.  You need help to stop smoking. Get help right away if:  You have chest pain.  You have trouble breathing. This information is not intended to replace advice given to you by your health care provider. Make sure you discuss any questions you have with your health care provider. Document Revised: 10/13/2017 Document Reviewed: 04/09/2016 Elsevier Patient Education  [place].  Cholesterol Content in Foods Cholesterol is a waxy, fat-like substance that helps to carry fat in the blood. The body needs cholesterol in small amounts, but too much cholesterol can cause damage to the arteries and heart. Most people should eat less than 200 milligrams (mg) of cholesterol a day. Foods with cholesterol  Cholesterol is found in animal-based foods, such as meat, seafood, and dairy. Generally, low-fat dairy and lean meats have less cholesterol than full-fat dairy and fatty meats. The milligrams of cholesterol per serving (mg per serving) of common cholesterol-containing foods are listed below. Meat and other proteins  Egg -- one large whole egg has 186 mg.  Veal shank -- 4 oz has 141 mg.  Lean ground [place] (93% lean) -- 4 oz has 118 mg.  Fat-trimmed lamb loin -- 4 oz has 106 mg.  Lean ground beef (90% lean) -- 4 oz has 100 mg.  Lobster -- 3.5 oz has 90 mg.  Pork loin chops -- 4 oz has  86 mg.  Canned salmon -- 3.5 oz has 83 mg.  Fat-trimmed beef top loin -- 4 oz has 78 mg.  Frankfurter -- 1 frank (3.5 oz) has 77 mg.  Crab -- 3.5 oz has 71 mg.  Roasted chicken without skin, white meat -- 4 oz has 66 mg.  Light bologna -- 2 oz has 45 mg.  Deli-cut [place] -- 2 oz has 31 mg.  Canned tuna -- 3.5 oz has 31 mg.  Berniece Salines -- 1 oz has 29 mg.  Oysters and mussels (raw) -- 3.5 oz has 25 mg.  Mackerel -- 1 oz has 22 mg.  Trout -- 1 oz has 20 mg.  Pork sausage -- 1 link (1 oz) has 17 mg.  Salmon -- 1 oz has 16 mg.  Tilapia -- 1 oz has 14 mg. Dairy  Soft-serve ice cream --  cup (4 oz) has 103 mg.  Whole-milk yogurt -- 1 cup (8 oz) has 29 mg.  Cheddar cheese -- 1 oz has 28 mg.  American cheese -- 1 oz has 28 mg.  Whole milk -- 1 cup (8 oz) has 23 mg.  2% milk -- 1 cup (8 oz) has 18  mg.  Cream cheese -- 1 tablespoon (Tbsp) has 15 mg.  Cottage cheese --  cup (4 oz) has 14 mg.  Low-fat (1%) milk -- 1 cup (8 oz) has 10 mg.  Sour cream -- 1 Tbsp has 8.5 mg.  Low-fat yogurt -- 1 cup (8 oz) has 8 mg.  Nonfat Greek yogurt -- 1 cup (8 oz) has 7 mg.  Half-and-half cream -- 1 Tbsp has 5 mg. Fats and oils  Cod liver oil -- 1 tablespoon (Tbsp) has 82 mg.  Butter -- 1 Tbsp has 15 mg.  Lard -- 1 Tbsp has 14 mg.  Bacon grease -- 1 Tbsp has 14 mg.  Mayonnaise -- 1 Tbsp has 5-10 mg.  Margarine -- 1 Tbsp has 3-10 mg. Exact amounts of cholesterol in these foods may vary depending on specific ingredients and brands. Foods without cholesterol Most plant-based foods do not have cholesterol unless you combine them with a food that has cholesterol. Foods without cholesterol include:  Grains and cereals.  Vegetables.  Fruits.  Vegetable oils, such as olive, canola, and sunflower oil.  Legumes, such as peas, beans, and lentils.  Nuts and seeds.  Egg whites. Summary  The body needs cholesterol in small amounts, but too much cholesterol can cause damage  to the arteries and heart.  Most people should eat less than 200 milligrams (mg) of cholesterol a day. This information is not intended to replace advice given to you by your health care provider. Make sure you discuss any questions you have with your health care provider. Document Revised: 09/22/2017 Document Reviewed: 06/06/2017 Elsevier Patient Education  2020 [place].   Kidney Stones  Kidney stones are solid, rock-like deposits that form inside of the kidneys. The kidneys are a pair of organs that make urine. A kidney stone may form in a kidney and move into other parts of the urinary tract, including the tubes that connect the kidneys to the bladder (ureters), the bladder, and the tube that carries urine out of the body (urethra). As the stone moves through these areas, it can cause intense pain and block the flow of urine. Kidney stones are created when high levels of certain minerals are found in the urine. The stones are usually passed out of the body through urination, but in some cases, medical treatment may be needed to remove them. What are the causes? Kidney stones may be caused by:  A condition in which certain glands produce too much parathyroid hormone (primary hyperparathyroidism), which causes too much calcium buildup in the blood.  A buildup of uric acid crystals in the bladder (hyperuricosuria). Uric acid is a chemical that the body produces when you eat certain foods. It usually exits the body in the urine.  Narrowing (stricture) of one or both of the ureters.  A kidney blockage that is present at birth (congenital obstruction).  Past surgery on the kidney or the ureters, such as gastric bypass surgery. What increases the risk? The following factors may make you more likely to develop this condition:  Having had a kidney stone in the past.  Having a family history of kidney stones.  Not drinking enough water.  Eating a diet that is high in protein, salt  (sodium), or sugar.  Being overweight or obese. What are the signs or symptoms? Symptoms of a kidney stone may include:  Pain in the side of the abdomen, right below the ribs (flank pain). Pain usually spreads (radiates) to the groin.  Needing to urinate  frequently or urgently.  Painful urination.  Blood in the urine (hematuria).  Nausea.  Vomiting.  Fever and chills. How is this diagnosed? This condition may be diagnosed based on:  Your symptoms and medical history.  A physical exam.  Blood tests.  Urine tests. These may be done before and after the stone passes out of your body through urination.  Imaging tests, such as a CT scan, abdominal X-ray, or ultrasound.  A procedure to examine the inside of the bladder (cystoscopy). How is this treated? Treatment for kidney stones depends on the size, location, and makeup of the stones. Kidney stones will often pass out of the body through urination. You may need to:  Increase your fluid intake to help pass the stone. In some cases, you may be given fluids through an IV and may need to be monitored at the hospital.  Take medicine for pain.  Make changes in your diet to help prevent kidney stones from coming back. Sometimes, medical procedures are needed to remove a kidney stone. This may involve:  A procedure to break up kidney stones using: ? A focused beam of light (laser therapy). ? Shock waves (extracorporeal shock wave lithotripsy).  Surgery to remove kidney stones. This may be needed if you have severe pain or have stones that block your urinary tract. Follow these instructions at home: Medicines  Take over-the-counter and prescription medicines only as told by your health care provider.  Ask your health care provider if the medicine prescribed to you requires you to avoid driving or using heavy machinery. Eating and drinking  Drink enough fluid to keep your urine pale yellow. You may be instructed to drink at  least 8-10 glasses of water each day. This will help you pass the kidney stone.  If directed, change your diet. This may include: ? Limiting how much sodium you eat. ? Eating more fruits and vegetables. ? Limiting how much animal protein--such as red meat, poultry, fish, and eggs--you eat.  Follow instructions from your health care provider about eating or drinking restrictions. General instructions  Collect urine samples as told by your health care provider. You may need to collect a urine sample: ? 24 hours after you pass the stone. ? 8-12 weeks after passing the kidney stone, and every 6-12 months after that.  Strain your urine every time you urinate, for as long as directed. Use the strainer that your health care provider recommends.  Do not throw out the kidney stone after passing it. Keep the stone so it can be tested by your health care provider. Testing the makeup of your kidney stone may help prevent you from getting kidney stones in the future.  Keep all follow-up visits as told by your health care provider. This is important. You may need follow-up X-rays or ultrasounds to make sure that your stone has passed. How is this prevented? To prevent another kidney stone:  Drink enough fluid to keep your urine pale yellow. This is the best way to prevent kidney stones.  Eat a healthy diet and follow recommendations from your health care provider about foods to avoid. You may be instructed to eat a low-protein diet. Recommendations vary depending on the type of kidney stone that you have.  Maintain a healthy weight. Where to find more information  [place] ([facility]): www.kidney.[facility]): www.urologyhealth.org Contact a health care provider if:  You have pain that gets worse or does not get better with medicine. Get  help right away if:  You have a fever or chills.  You develop severe pain.  You develop new abdominal pain.  You  faint.  You are unable to urinate. Summary  Kidney stones are solid, rock-like deposits that form inside of the kidneys.  Kidney stones can cause nausea, vomiting, blood in the urine, abdominal pain, and the urge to urinate frequently.  Treatment for kidney stones depends on the size, location, and makeup of the stones. Kidney stones will often pass out of the body through urination.  Kidney stones can be prevented by drinking enough fluids, eating a healthy diet, and maintaining a healthy weight. This information is not intended to replace advice given to you by your health care provider. Make sure you discuss any questions you have with your health care provider. Document Revised: 02/26/2019 Document Reviewed: 02/26/2019 Elsevier Patient Education  [place].

## 2019-11-21 NOTE — Progress Notes (Signed)
Virtual Visit via Video Note  I connected with [name]  on 11/21/19 at 10:20 AM EST by a video enabled telemedicine application and verified that I am speaking with the correct person using two identifiers.  Location patient: home Location provider:work or home office Persons participating in the virtual visit: patient, provider, pts husband  I discussed the limitations of evaluation and management by telemedicine and the availability of in person appointments. The patient expressed understanding and agreed to proceed.   HPI: 1. 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 HLD reviewed labs from 11/14/19 she also is to f/u with PCP  3. Vitamin D def taking D3 2000 IU qd rec 4000 IU qd   ROS: See pertinent positives and negatives per HPI.  Past Medical History:  Diagnosis Date  . Adenomatous polyps 2010   colon  . Allergy   . Arthritis   . Chicken pox   . GERD (gastroesophageal reflux disease)   . Hyperlipidemia   . Hypertension   . Kidney stone   . Pneumonia     Past Surgical History:  Procedure Laterality Date  . EXTRACORPOREAL SHOCK WAVE LITHOTRIPSY Left 03/28/2019   Procedure: EXTRACORPOREAL SHOCK WAVE LITHOTRIPSY (ESWL);  Surgeon: [name], MD;  Location: [facility] ORS;  Service: Urology;  Laterality: Left;  . SPINE SURGERY      Family History  Problem Relation Age of Onset  . Cancer Mother   . Stroke Mother   . Heart disease Mother   . Hypertension Mother   . Diabetes Mother   . Breast cancer Mother 53  . Heart disease Father   . Hypertension Father     SOCIAL HX: lives with husband  Daughter lives in [place]    Current Outpatient Medications:  .  amLODipine (NORVASC) 2.5 MG tablet, TAKE 1 TABLET AT BEDTIME, Disp: 90 tablet, Rfl: 2 .  Cholecalciferol 100 MCG  (4000 UT) CAPS, Take by mouth., Disp: , Rfl:  .  ezetimibe (ZETIA) 10 MG tablet, Take 1 tablet (10 mg total) by mouth daily., Disp: 90 tablet, Rfl: 1 .  famotidine (PEPCID) 20 MG tablet, TAKE 1 TABLET BY MOUTH TWICE DAILY, Disp: 180 tablet, Rfl: 3 .  meloxicam (MOBIC) 7.5 MG tablet, Take 1 tablet (7.5 mg total) by mouth as needed., Disp: 90 tablet, Rfl: 1 .  mupirocin ointment (BACTROBAN) 2 %, Place 1 application into the nose 2 (two) times daily. As needed, Disp: 22 g, Rfl: 5 .  triamcinolone (NASACORT) 55 MCG/ACT AERO nasal inhaler, Place 2 sprays into the nose as needed., Disp: , Rfl:  .  EPINEPHrine (EPIPEN 2-PAK) 0.3 mg/0.3 mL IJ SOAJ injection, Inject 0.3 mLs (0.3 mg total) into the muscle as needed for anaphylaxis., Disp: 1 each, Rfl: 1  EXAM:  VITALS per patient if applicable:  GENERAL: alert, oriented, appears well and in no acute distress  HEENT: atraumatic, conjunttiva clear, no obvious abnormalities on inspection of external nose and ears  NECK: normal movements of the head and neck  LUNGS: on inspection no signs of respiratory distress, breathing rate appears normal, no obvious gross SOB, gasping or wheezing  CV: no obvious cyanosis  MS: moves all visible extremities without noticeable abnormality  PSYCH/NEURO: pleasant and cooperative, no obvious depression or anxiety, speech  and thought processing grossly intact  ASSESSMENT AND PLAN:  Discussed the following assessment and plan:  Anaphylaxis, subsequent encounter - Plan: EPINEPHrine (EPIPEN 2-PAK) 0.3 mg/0.3 mL IJ SOAJ injection Tolerated moderna 11/19/19 2nd dose upcoming and wants epipen just in case  Hyperlipidemia, unspecified hyperlipidemia type Given diet list disc with PCP   Vitamin D deficiency rec D3 4000 Iu from 2000 IU qd   -we discussed possible serious and likely etiologies, options for evaluation and workup, limitations of telemedicine visit vs in person visit, treatment, treatment risks and  precautions. Pt prefers to treat via telemedicine empirically rather then risking or undertaking an in person visit at this moment. Patient agrees to seek prompt in person care if worsening, new symptoms arise, or if is not improving with treatment.   I discussed the assessment and treatment plan with the patient. The patient was provided an opportunity to ask questions and all were answered. The patient agreed with the plan and demonstrated an understanding of the instructions.   The patient was advised to call back or seek an in-person evaluation if the symptoms worsen or if the condition fails to improve as anticipated.  Time spent 20 minutes  Delorise Jackson, MD

## 2019-11-28 ENCOUNTER — Ambulatory Visit: Payer: PRIVATE HEALTH INSURANCE

## 2019-12-06 ENCOUNTER — Ambulatory Visit: Payer: PRIVATE HEALTH INSURANCE

## 2020-01-31 DIAGNOSIS — H2513 Age-related nuclear cataract, bilateral: Secondary | ICD-10-CM | POA: Diagnosis not present

## 2020-02-24 NOTE — Telephone Encounter (Signed)
Pt called to schedule an appt she is wanting to be seen sooner if possible by Dr. Derrel Nip

## 2020-02-25 ENCOUNTER — Telehealth: Payer: Self-pay

## 2020-02-25 ENCOUNTER — Other Ambulatory Visit: Payer: Self-pay

## 2020-02-25 ENCOUNTER — Encounter: Payer: Self-pay | Admitting: Internal Medicine

## 2020-02-25 ENCOUNTER — Ambulatory Visit (INDEPENDENT_AMBULATORY_CARE_PROVIDER_SITE_OTHER): Payer: Medicare Other | Admitting: Internal Medicine

## 2020-02-25 VITALS — BP 144/74 | HR 73 | Temp 97.4°F | Resp 14 | Ht 62.0 in | Wt 123.8 lb

## 2020-02-25 DIAGNOSIS — R590 Localized enlarged lymph nodes: Secondary | ICD-10-CM | POA: Insufficient documentation

## 2020-02-25 HISTORY — DX: Localized enlarged lymph nodes: R59.0

## 2020-02-25 NOTE — Assessment & Plan Note (Signed)
Left axillary fullness without discrete LAD on today's exam.  Given her FH of Breast Ca,  Will obtain diagnostic mammogram and U/S of left axilla

## 2020-02-25 NOTE — Telephone Encounter (Signed)
Spoke with pt to schedule her an appt for a swollen lymph node under her left arm. As I was screening the pt she stated that she is having trouble with her allergies and is sneezing and having an occasional cough. She stated that the cough only occurs when she is outside. 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 Is it okay for pt to still come in for her appt today?

## 2020-02-25 NOTE — Patient Instructions (Signed)
I have ordered a diagnostic mammogram and left axillary ultrasound .  If an enlarged lymph node is found,  I will recommend referral to a general surgeon for a biopsy and will call you to find out who you would like to see

## 2020-02-25 NOTE — Telephone Encounter (Signed)
Yes,  She and Dr Derrel Nip have been very careful.  I will see her.

## 2020-02-25 NOTE — Telephone Encounter (Signed)
Pt is aware that she is able to come into the office.

## 2020-02-25 NOTE — Progress Notes (Signed)
Subjective:  Patient ID: [name], female    DOB: Jul 09, 1951  Age: 69 y.o. MRN: [id number]  CC: The encounter diagnosis was Lymphadenopathy, axillary.  HPI [name] presents for evaluation of swollen LN left axilla  , first noticed in the shower about 10 days ago. not tender,  But feels it is enlarging . Denies, fevers,  Respiratory symptoms,  Weight loss.    Mammogram normal June 2020 Last colonoscopy 2019 Last PAP normal 2016 (age  62)     Recently found 5 tiny ticks on  her after gardening  . Patient has received both doses of the available COVID 19 vaccine In Jan/February, without complications.  Patient continues to mask when outside of the home except when walking in yard or at safe distances from others .  Patient denies any change in mood or development of unhealthy behaviors resuting from the pandemic's restriction of activities and socialization.    Outpatient Medications Prior to Visit  Medication Sig Dispense Refill  . amLODipine (NORVASC) 2.5 MG tablet TAKE 1 TABLET AT BEDTIME 90 tablet 2  . Cholecalciferol 100 MCG (4000 UT) CAPS Take by mouth.    . EPINEPHrine (EPIPEN 2-PAK) 0.3 mg/0.3 mL IJ SOAJ injection Inject 0.3 mLs (0.3 mg total) into the muscle as needed for anaphylaxis. 1 each 1  . ezetimibe (ZETIA) 10 MG tablet Take 1 tablet (10 mg total) by mouth daily. 90 tablet 1  . famotidine (PEPCID) 20 MG tablet TAKE 1 TABLET BY MOUTH TWICE DAILY 180 tablet 3  . Lithium Carbonate POWD     . meloxicam (MOBIC) 7.5 MG tablet Take 1 tablet (7.5 mg total) by mouth as needed. 90 tablet 1  . mupirocin ointment (BACTROBAN) 2 % Place 1 application into the nose 2 (two) times daily. As needed 22 g 5  . triamcinolone (NASACORT) 55 MCG/ACT AERO nasal inhaler Place 2 sprays into the nose as needed.     No facility-administered medications prior to visit.    Review of Systems;  Patient denies headache, fevers, malaise, unintentional weight loss, skin rash, eye pain, sinus  congestion and sinus pain, sore throat, dysphagia,  hemoptysis , cough, dyspnea, wheezing, chest pain, palpitations, orthopnea, edema, abdominal pain, nausea, melena, diarrhea, constipation, flank pain, dysuria, hematuria, urinary  Frequency, nocturia, numbness, tingling, seizures,  Focal weakness, Loss of consciousness,  Tremor, insomnia, depression, anxiety, and suicidal ideation.      Objective:  BP (!) 144/74 (BP Location: Right Arm, Patient Position: Sitting, Cuff Size: Normal)   Pulse 73   Temp (!) 97.4 F (36.3 C) (Temporal)   Resp 14   Ht 5\' 2"  (1.575 m)   Wt 123 lb 12.8 oz (56.2 kg)   SpO2 98%   BMI 22.64 kg/m   BP Readings from Last 3 Encounters:  02/25/20 (!) 144/74  11/21/19 127/69  11/06/19 120/65    Wt Readings from Last 3 Encounters:  02/25/20 123 lb 12.8 oz (56.2 kg)  11/21/19 122 lb (55.3 kg)  11/06/19 122 lb (55.3 kg)    General appearance: alert, cooperative and appears stated age Ears: normal TM's and external ear canals both ears Throat: lips, mucosa, and tongue normal; teeth and gums normal Neck: no adenopathy, no carotid bruit, supple, symmetrical, trachea midline and thyroid not enlarged, symmetric, no tenderness/mass/nodules Back: symmetric, no curvature. ROM normal. No CVA tenderness. Lungs: clear to auscultation bilaterally Heart: regular rate and rhythm, S1, S2 normal, no murmur, click, rub or gallop Abdomen: soft, non-tender; bowel sounds  normal; no masses,  no organomegaly Pulses: 2+ and symmetric Skin: Skin color, texture, turgor normal. No rashes or lesions Lymph nodes: Cervical, supraclavicular, and axillary nodes normal.  No results found for: HGBA1C  Lab Results  Component Value Date   CREATININE 0.83 11/14/2019   CREATININE 0.73 10/31/2018   CREATININE 0.76 06/06/2018    Lab Results  Component Value Date   WBC 5.2 05/26/2017   HGB 13.4 05/26/2017   HCT 40.3 05/26/2017   PLT 227 05/26/2017   GLUCOSE 92 11/14/2019   CHOL 204  (H) 11/14/2019   TRIG 75.0 11/14/2019   HDL 61.20 11/14/2019   LDLDIRECT 173.0 06/06/2018   LDLCALC 128 (H) 11/14/2019   ALT 15 11/14/2019   AST 20 11/14/2019   NA 141 11/14/2019   K 4.1 11/14/2019   CL 106 11/14/2019   CREATININE 0.83 11/14/2019   BUN 14 11/14/2019   CO2 26 11/14/2019   TSH 3.52 11/14/2019    Abdomen 1 view (KUB)  Result Date: 08/16/2019 CLINICAL DATA:  Nephrolithiasis. EXAM: ABDOMEN - 1 VIEW COMPARISON:  04/16/2019. FINDINGS: Soft tissue structures are unremarkable. Large amount of stool noted throughout the colon. No bowel distention or free air. Tiny bilateral renal stones noted. Degenerative change lumbar spine and both hips. Prior lumbosacral spine fusion. Hardware intact. IMPRESSION: 1.  Tiny bilateral renal stones noted. 2. Large amount of stool noted throughout the colon. Constipation could present this fashion. Electronically Signed   By: Marcello Moores  Register   On: 08/16/2019 10:17    Assessment & Plan:   Problem List Items Addressed This Visit      Unprioritized   Lymphadenopathy, axillary - Primary    Left axillary fullness without discrete LAD on today's exam.  Given her FH of Breast Ca,  Will obtain diagnostic mammogram and U/S of left axilla       Relevant Orders   MM Digital Diagnostic Bilat   US BREAST COMPLETE UNI LEFT INC AXILLA      I am having Evolette C. Novakovich maintain her triamcinolone, famotidine, ezetimibe, meloxicam, amLODipine, mupirocin ointment, Cholecalciferol, EPINEPHrine, and Lithium Carbonate.  No orders of the defined types were placed in this encounter.   There are no discontinued medications.  Follow-up: No follow-ups on file.   [name], MD

## 2020-02-25 NOTE — Telephone Encounter (Signed)
Pt has been rescheduled for today.

## 2020-02-26 ENCOUNTER — Other Ambulatory Visit: Payer: Self-pay | Admitting: Internal Medicine

## 2020-02-26 ENCOUNTER — Telehealth: Payer: Self-pay

## 2020-02-26 DIAGNOSIS — R590 Localized enlarged lymph nodes: Secondary | ICD-10-CM

## 2020-02-26 NOTE — Addendum Note (Signed)
Addended by: Adair Laundry on: 02/26/2020 11:48 AM   Modules accepted: Orders

## 2020-02-26 NOTE — Telephone Encounter (Signed)
Corrected diagnostic mammogram order.

## 2020-02-27 ENCOUNTER — Ambulatory Visit: Payer: Medicare Other | Admitting: Internal Medicine

## 2020-03-02 DIAGNOSIS — H43812 Vitreous degeneration, left eye: Secondary | ICD-10-CM | POA: Diagnosis not present

## 2020-03-04 ENCOUNTER — Ambulatory Visit
Admission: RE | Admit: 2020-03-04 | Discharge: 2020-03-04 | Disposition: A | Discharge status: Home / Self Care | Payer: Medicare Other | Source: Ambulatory Visit | Attending: Internal Medicine | Admitting: Internal Medicine

## 2020-03-04 DIAGNOSIS — R590 Localized enlarged lymph nodes: Secondary | ICD-10-CM | POA: Diagnosis not present

## 2020-03-04 DIAGNOSIS — N6489 Other specified disorders of breast: Secondary | ICD-10-CM | POA: Diagnosis not present

## 2020-03-04 DIAGNOSIS — R928 Other abnormal and inconclusive findings on diagnostic imaging of breast: Secondary | ICD-10-CM | POA: Diagnosis not present

## 2020-03-10 ENCOUNTER — Other Ambulatory Visit: Payer: Self-pay

## 2020-03-10 ENCOUNTER — Encounter: Payer: Self-pay | Admitting: Internal Medicine

## 2020-03-10 ENCOUNTER — Ambulatory Visit (INDEPENDENT_AMBULATORY_CARE_PROVIDER_SITE_OTHER): Payer: Medicare Other | Admitting: Internal Medicine

## 2020-03-10 DIAGNOSIS — N200 Calculus of kidney: Secondary | ICD-10-CM | POA: Diagnosis not present

## 2020-03-10 DIAGNOSIS — Z789 Other specified health status: Secondary | ICD-10-CM | POA: Diagnosis not present

## 2020-03-10 DIAGNOSIS — E785 Hyperlipidemia, unspecified: Secondary | ICD-10-CM | POA: Diagnosis not present

## 2020-03-10 DIAGNOSIS — E559 Vitamin D deficiency, unspecified: Secondary | ICD-10-CM

## 2020-03-10 DIAGNOSIS — I10 Essential (primary) hypertension: Secondary | ICD-10-CM | POA: Diagnosis not present

## 2020-03-10 DIAGNOSIS — R590 Localized enlarged lymph nodes: Secondary | ICD-10-CM

## 2020-03-10 MED ORDER — TETANUS-DIPHTH-ACELL PERTUSSIS 5-2.5-18.5 LF-MCG/0.5 IM SUSP: 0.5000 mL | Freq: Once | INTRAMUSCULAR | 0 refills | Status: AC

## 2020-03-10 NOTE — Assessment & Plan Note (Signed)
Now taking 4000 Ius daily

## 2020-03-10 NOTE — Assessment & Plan Note (Signed)
Well controlled on current regimen. Renal function stable, no changes today. 

## 2020-03-10 NOTE — Assessment & Plan Note (Signed)
Previous trial  of statin caused S/E of memory loss which subjectively improved with cessation.  Her ten year risk of CAD is 15%.  Tolerating Zetia, LDL is < 130    Lab Results  Component Value Date   CHOL 204 (H) 11/14/2019   HDL 61.20 11/14/2019   LDLCALC 128 (H) 11/14/2019   LDLDIRECT 173.0 06/06/2018   TRIG 75.0 11/14/2019   CHOLHDL 3 11/14/2019

## 2020-03-10 NOTE — Assessment & Plan Note (Signed)
Negative ultrasound and diagnostic mammogram

## 2020-03-10 NOTE — Assessment & Plan Note (Signed)
She has 2 remaining kidney stones,  And lithotripsy was postponed due to [place].  Has appt in October.  Has been maintaining hydration . Remains asymptomatic

## 2020-03-10 NOTE — Progress Notes (Signed)
Subjective:  Patient ID: [name], female    DOB: 03-15-1951  Age: 69 y.o. MRN: [id number]  CC: Diagnoses of Lymphadenopathy, axillary, Nephrolithiasis, Vitamin D deficiency, Hyperlipidemia with target LDL less than 130, Essential hypertension, benign, and Patient has active power of attorney for health care were pertinent to this visit.  HPI [name] presents for follow up on patient reported axillary LAD.  Was sent for diagnostic mammogram and ultrasound which failed to show any mass including no signs of enlarged Lymph  Nodes.  She thinks she may have had swelling that has resolved and was due to muscle strain  This visit occurred during the SARS-CoV-2 public health emergency.  Safety protocols were in place, including screening questions prior to the visit, additional usage of staff PPE, and extensive cleaning of exam room while observing appropriate contact time as indicated for disinfecting solutions.      Patient is anxious today due to concerns about her daughter in law who is from [place] and having some post partum issues complicated by cultural differences,  COVID pandemic and return  to normalcy adjustment issues.  recognizes that the issues are beyond her control .  Taking 4000 Iu's daily since January for low D on 2000 ius daily   Bilateral cataracts:  Postponing left cataract surgery until after Ed has his hip replacement.  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 Seeing [place] at [facility]  Using meloxicam prn neck pain , mostly in the winter        Outpatient Medications Prior to Visit  Medication Sig Dispense Refill  . amLODipine (NORVASC) 2.5 MG tablet TAKE 1 TABLET AT BEDTIME 90 tablet 2  . Cholecalciferol 100 MCG (4000 UT) CAPS Take by mouth.    . EPINEPHrine (EPIPEN 2-PAK) 0.3 mg/0.3 mL IJ SOAJ injection Inject 0.3 mLs (0.3 mg total) into the muscle as needed for anaphylaxis. 1 each  1  . ezetimibe (ZETIA) 10 MG tablet Take 1 tablet (10 mg total) by mouth daily. 90 tablet 1  . Lithium Carbonate POWD     . meloxicam (MOBIC) 7.5 MG tablet Take 1 tablet (7.5 mg total) by mouth as needed. 90 tablet 1  . mupirocin ointment (BACTROBAN) 2 % Place 1 application into the nose 2 (two) times daily. As needed 22 g 5  . triamcinolone (NASACORT) 55 MCG/ACT AERO nasal inhaler Place 2 sprays into the nose as needed.    . famotidine (PEPCID) 20 MG tablet TAKE 1 TABLET BY MOUTH TWICE DAILY (Patient not taking: Reported on 03/10/2020) 180 tablet 3   No facility-administered medications prior to visit.    Review of Systems;  Patient denies headache, fevers, malaise, unintentional weight loss, skin rash, eye pain, sinus congestion and sinus pain, sore throat, dysphagia,  hemoptysis , cough, dyspnea, wheezing, chest pain, palpitations, orthopnea, edema, abdominal pain, nausea, melena, diarrhea, constipation, flank pain, dysuria, hematuria, urinary  Frequency, nocturia, numbness, tingling, seizures,  Focal weakness, Loss of consciousness,  Tremor, insomnia, depression, anxiety, and suicidal ideation.      Objective:  BP 130/86 (BP Location: Left Arm, Patient Position: Sitting, Cuff Size: Normal)   Pulse 85   Temp 97.6 F (36.4 C) (Temporal)   Resp 15   Ht 5\' 2"  (1.575 m)   Wt 120 lb 12.8 oz (54.8 kg)   SpO2 97%   BMI 22.09 kg/m   BP Readings from Last 3 Encounters:  03/10/20 130/86  02/25/20 (!) 144/74  11/21/19 127/69    Wt Readings from Last 3 Encounters:  03/10/20 120 lb 12.8 oz (54.8 kg)  02/25/20 123 lb 12.8 oz (56.2 kg)  11/21/19 122 lb (55.3 kg)    General appearance: alert, cooperative and appears stated age Ears: normal TM's and external ear canals both ears Throat: lips, mucosa, and tongue normal; teeth and gums normal Neck: no adenopathy, no carotid bruit, supple, symmetrical, trachea midline and thyroid not enlarged, symmetric, no tenderness/mass/nodules Back:  symmetric, no curvature. ROM normal. No CVA tenderness. Lungs: clear to auscultation bilaterally Heart: regular rate and rhythm, S1, S2 normal, no murmur, click, rub or gallop Abdomen: soft, non-tender; bowel sounds normal; no masses,  no organomegaly Pulses: 2+ and symmetric Skin: Skin color, texture, turgor normal. No rashes or lesions Lymph nodes: Cervical, supraclavicular, and axillary nodes normal.  No results found for: HGBA1C  Lab Results  Component Value Date   CREATININE 0.83 11/14/2019   CREATININE 0.73 10/31/2018   CREATININE 0.76 06/06/2018    Lab Results  Component Value Date   WBC 5.2 05/26/2017   HGB 13.4 05/26/2017   HCT 40.3 05/26/2017   PLT 227 05/26/2017   GLUCOSE 92 11/14/2019   CHOL 204 (H) 11/14/2019   TRIG 75.0 11/14/2019   HDL 61.20 11/14/2019   LDLDIRECT 173.0 06/06/2018   LDLCALC 128 (H) 11/14/2019   ALT 15 11/14/2019   AST 20 11/14/2019   NA 141 11/14/2019   K 4.1 11/14/2019   CL 106 11/14/2019   CREATININE 0.83 11/14/2019   BUN 14 11/14/2019   CO2 26 11/14/2019   TSH 3.52 11/14/2019    US BREAST LTD UNI LEFT INC AXILLA  Result Date: 03/04/2020 CLINICAL DATA:  Patient complains of a palpable abnormality in the left axilla. EXAM: DIGITAL DIAGNOSTIC BILATERAL MAMMOGRAM WITH CAD AND TOMO ULTRASOUND LEFT BREAST COMPARISON:  Previous exam(s). ACR Breast Density Category b: There are scattered areas of fibroglandular density. FINDINGS: No suspicious mass, malignant type microcalcifications or distortion detected in either breast. Mammographic images were processed with CAD. On physical exam, I do not palpate a discrete mass in the left axilla. Targeted ultrasound is performed, showing normal tissue in the left axilla. No enlarged adenopathy is visualized. IMPRESSION: No evidence of malignancy in either breast. RECOMMENDATION: Bilateral screening mammogram in 1 year is recommended. I have discussed the findings and recommendations with the patient. If  applicable, a reminder letter will be sent to the patient regarding the next appointment. BI-RADS CATEGORY  1: Negative. Electronically Signed   By: Lillia Mountain M.D.   On: 03/04/2020 10:12   MM DIAG BREAST TOMO BILATERAL  Result Date: 03/04/2020 CLINICAL DATA:  Patient complains of a palpable abnormality in the left axilla. EXAM: DIGITAL DIAGNOSTIC BILATERAL MAMMOGRAM WITH CAD AND TOMO ULTRASOUND LEFT BREAST COMPARISON:  Previous exam(s). ACR Breast Density Category b: There are scattered areas of fibroglandular density. FINDINGS: No suspicious mass, malignant type microcalcifications or distortion detected in either breast. Mammographic images were processed with CAD. On physical exam, I do not palpate a discrete mass in the left axilla. Targeted ultrasound is performed, showing normal tissue in the left axilla. No enlarged adenopathy is visualized. IMPRESSION: No evidence of malignancy in either breast. RECOMMENDATION: Bilateral screening mammogram in 1 year is recommended. I have discussed the findings and recommendations with the patient. If applicable, a reminder letter will be sent to the patient regarding the next appointment. BI-RADS CATEGORY  1: Negative. Electronically Signed   By: Mordecai Rasmussen  Arceo M.D.   On: 03/04/2020 10:12    Assessment & Plan:   Problem List Items Addressed This Visit      Unprioritized   Essential hypertension, benign    Well controlled on current regimen. Renal function stable, no changes today.      Relevant Orders   Comprehensive metabolic panel   Hyperlipidemia with target LDL less than 130    Previous trial  of statin caused S/E of memory loss which subjectively improved with cessation.  Her ten year risk of CAD is 15%.  Tolerating Zetia, LDL is < 130    Lab Results  Component Value Date   CHOL 204 (H) 11/14/2019   HDL 61.20 11/14/2019   LDLCALC 128 (H) 11/14/2019   LDLDIRECT 173.0 06/06/2018   TRIG 75.0 11/14/2019   CHOLHDL 3 11/14/2019          Relevant Orders   Lipid panel   Lymphadenopathy, axillary    Negative ultrasound and diagnostic mammogram       Nephrolithiasis    She has 2 remaining kidney stones,  And lithotripsy was postponed due to [place].  Has appt in October.  Has been maintaining hydration . Remains asymptomatic       Patient has active power of attorney for health care   Vitamin D deficiency    Now taking 4000 Ius daily       Relevant Orders   VITAMIN D 25 Hydroxy (Vit-D Deficiency, Fractures)      I have discontinued Ivin Booty C. Wind's famotidine. I am also having her start on Tdap. Additionally, I am having her maintain her triamcinolone, ezetimibe, meloxicam, amLODipine, mupirocin ointment, Cholecalciferol, EPINEPHrine, and Lithium Carbonate.  Meds ordered this encounter  Medications  . Tdap (BOOSTRIX) 5-2.5-18.5 LF-MCG/0.5 injection    Sig: Inject 0.5 mLs into the muscle once for 1 dose.    Dispense:  0.5 mL    Refill:  0    Medications Discontinued During This Encounter  Medication Reason  . famotidine (PEPCID) 20 MG tablet Patient has not taken in last 30 days    Follow-up: No follow-ups on file.   [name], MD

## 2020-03-10 NOTE — Patient Instructions (Signed)
Glad the ultrasound was negative!  Your next mammogram is due in May 2022  We will repeat labs in July    I have printed your TDAP vaccine which is due next year (sooner if you have a dog bite,  Laceration,  Cut etc)

## 2020-03-11 ENCOUNTER — Other Ambulatory Visit (INDEPENDENT_AMBULATORY_CARE_PROVIDER_SITE_OTHER): Payer: Medicare Other

## 2020-03-11 DIAGNOSIS — E559 Vitamin D deficiency, unspecified: Secondary | ICD-10-CM | POA: Diagnosis not present

## 2020-03-11 DIAGNOSIS — E785 Hyperlipidemia, unspecified: Secondary | ICD-10-CM

## 2020-03-11 DIAGNOSIS — I10 Essential (primary) hypertension: Secondary | ICD-10-CM | POA: Diagnosis not present

## 2020-03-11 LAB — COMPREHENSIVE METABOLIC PANEL
ALT: 21 U/L (ref 0–35)
AST: 23 U/L (ref 0–37)
Albumin: 4.3 g/dL (ref 3.5–5.2)
Alkaline Phosphatase: 57 U/L (ref 39–117)
BUN: 18 mg/dL (ref 6–23)
CO2: 30 mEq/L (ref 19–32)
Calcium: 9.4 mg/dL (ref 8.4–10.5)
Chloride: 104 mEq/L (ref 96–112)
Creatinine, Ser: 0.84 mg/dL (ref 0.40–1.20)
GFR: 66.88 mL/min (ref >60.00)
Glucose, Bld: 94 mg/dL (ref 70–99)
Potassium: 4.4 mEq/L (ref 3.5–5.1)
Sodium: 141 mEq/L (ref 135–145)
Total Bilirubin: 0.7 mg/dL (ref 0.2–1.2)
Total Protein: 6.3 g/dL (ref 6.0–8.3)

## 2020-03-11 LAB — LIPID PANEL
Cholesterol: 190 mg/dL (ref 0–200)
HDL: 56.3 mg/dL (ref >39.00)
LDL Cholesterol: 119 mg/dL — ABNORMAL HIGH (ref 0–99)
NonHDL: 133.91
Total CHOL/HDL Ratio: 3
Triglycerides: 74 mg/dL (ref 0.0–149.0)
VLDL: 14.8 mg/dL (ref 0.0–40.0)

## 2020-03-11 LAB — VITAMIN D 25 HYDROXY (VIT D DEFICIENCY, FRACTURES): VITD: 50.51 ng/mL (ref 30.00–100.00)

## 2020-03-12 ENCOUNTER — Telehealth: Payer: Self-pay | Admitting: Internal Medicine

## 2020-03-12 MED ORDER — EZETIMIBE 10 MG PO TABS: 10.0000 mg | ORAL_TABLET | Freq: Every day | ORAL | 1 refills | Status: DC

## 2020-03-12 NOTE — Telephone Encounter (Signed)
Pt needs a refill on ezetimibe (ZETIA) 10 MG tablet sent to [facility] in [facility]

## 2020-03-12 NOTE — Addendum Note (Signed)
Addended by: Elpidio Galea T on: 03/12/2020 10:55 AM   Modules accepted: Orders

## 2020-03-20 ENCOUNTER — Ambulatory Visit: Payer: PRIVATE HEALTH INSURANCE | Admitting: Internal Medicine

## 2020-03-25 ENCOUNTER — Ambulatory Visit: Payer: PRIVATE HEALTH INSURANCE | Admitting: Internal Medicine

## 2020-03-27 ENCOUNTER — Other Ambulatory Visit: Payer: Self-pay | Admitting: Internal Medicine

## 2020-03-27 MED ORDER — ALENDRONATE SODIUM 70 MG PO TABS
70.0000 mg | ORAL_TABLET | ORAL | 3 refills | Status: DC
Start: 2020-03-27 — End: 2020-12-23

## 2020-04-06 DIAGNOSIS — H43813 Vitreous degeneration, bilateral: Secondary | ICD-10-CM | POA: Diagnosis not present

## 2020-05-04 ENCOUNTER — Encounter: Payer: Self-pay | Admitting: Ophthalmology

## 2020-05-11 NOTE — Discharge Instructions (Signed)

## 2020-05-13 ENCOUNTER — Ambulatory Visit: Payer: Medicare Other | Admitting: Anesthesiology

## 2020-05-13 ENCOUNTER — Encounter: Payer: Self-pay | Admitting: Ophthalmology

## 2020-05-13 ENCOUNTER — Encounter
Admission: RE | Disposition: A | Discharge status: Home / Self Care | Payer: Self-pay | Source: Home / Self Care | Attending: Ophthalmology

## 2020-05-13 ENCOUNTER — Ambulatory Visit
Admission: RE | Admit: 2020-05-13 | Discharge: 2020-05-13 | Disposition: A | Discharge status: Home / Self Care | Payer: Medicare Other | Attending: Ophthalmology | Admitting: Ophthalmology

## 2020-05-13 ENCOUNTER — Other Ambulatory Visit: Payer: Self-pay

## 2020-05-13 DIAGNOSIS — Z79899 Other long term (current) drug therapy: Secondary | ICD-10-CM | POA: Diagnosis not present

## 2020-05-13 DIAGNOSIS — H2512 Age-related nuclear cataract, left eye: Secondary | ICD-10-CM | POA: Insufficient documentation

## 2020-05-13 DIAGNOSIS — Z7983 Long term (current) use of bisphosphonates: Secondary | ICD-10-CM | POA: Insufficient documentation

## 2020-05-13 DIAGNOSIS — I10 Essential (primary) hypertension: Secondary | ICD-10-CM | POA: Diagnosis not present

## 2020-05-13 DIAGNOSIS — H25812 Combined forms of age-related cataract, left eye: Secondary | ICD-10-CM | POA: Diagnosis not present

## 2020-05-13 DIAGNOSIS — H9190 Unspecified hearing loss, unspecified ear: Secondary | ICD-10-CM | POA: Diagnosis not present

## 2020-05-13 HISTORY — DX: Other complications of anesthesia, initial encounter: T88.59XA

## 2020-05-13 HISTORY — PX: CATARACT EXTRACTION W/PHACO: SHX586

## 2020-05-13 SURGERY — PHACOEMULSIFICATION, CATARACT, WITH IOL INSERTION
Anesthesia: Monitor Anesthesia Care | Site: Eye | Laterality: Left

## 2020-05-13 MED ORDER — MOXIFLOXACIN HCL 0.5 % OP SOLN
1.0000 [drp] | OPHTHALMIC | Status: DC | PRN
  Administered 2020-05-13 (×3): 1 [drp] via OPHTHALMIC

## 2020-05-13 MED ORDER — LIDOCAINE HCL (PF) 2 % IJ SOLN
INTRAOCULAR | Status: DC | PRN
  Administered 2020-05-13: 1 mL

## 2020-05-13 MED ORDER — LACTATED RINGERS IV SOLN: INTRAVENOUS | Status: DC

## 2020-05-13 MED ORDER — EPINEPHRINE PF 1 MG/ML IJ SOLN
INTRAOCULAR | Status: DC | PRN
  Administered 2020-05-13: 55 mL via OPHTHALMIC

## 2020-05-13 MED ORDER — ARMC OPHTHALMIC DILATING DROPS
1.0000 "application " | OPHTHALMIC | Status: DC | PRN
  Administered 2020-05-13 (×3): 1 via OPHTHALMIC

## 2020-05-13 MED ORDER — FENTANYL CITRATE (PF) 100 MCG/2ML IJ SOLN
INTRAMUSCULAR | Status: DC | PRN
  Administered 2020-05-13: 50 ug via INTRAVENOUS

## 2020-05-13 MED ORDER — ACETAMINOPHEN 160 MG/5ML PO SOLN: 325.0000 mg | ORAL | Status: DC | PRN

## 2020-05-13 MED ORDER — MOXIFLOXACIN HCL 0.5 % OP SOLN
OPHTHALMIC | Status: DC | PRN
  Administered 2020-05-13: 0.2 mL via OPHTHALMIC

## 2020-05-13 MED ORDER — NA HYALUR & NA CHOND-NA HYALUR 0.4-0.35 ML IO KIT
PACK | INTRAOCULAR | Status: DC | PRN
  Administered 2020-05-13: 1 mL via INTRAOCULAR

## 2020-05-13 MED ORDER — TETRACAINE HCL 0.5 % OP SOLN
1.0000 [drp] | OPHTHALMIC | Status: DC | PRN
  Administered 2020-05-13 (×3): 1 [drp] via OPHTHALMIC

## 2020-05-13 MED ORDER — BRIMONIDINE TARTRATE-TIMOLOL 0.2-0.5 % OP SOLN
OPHTHALMIC | Status: DC | PRN
  Administered 2020-05-13: 1 [drp] via OPHTHALMIC

## 2020-05-13 MED ORDER — ACETAMINOPHEN 325 MG PO TABS: 325.0000 mg | ORAL_TABLET | ORAL | Status: DC | PRN

## 2020-05-13 MED ORDER — MIDAZOLAM HCL 2 MG/2ML IJ SOLN
INTRAMUSCULAR | Status: DC | PRN
  Administered 2020-05-13 (×2): 1 mg via INTRAVENOUS

## 2020-05-13 SURGICAL SUPPLY — 30 items
CANNULA ANT/CHMB 27G (MISCELLANEOUS) ×1 IMPLANT
CANNULA ANT/CHMB 27GA (MISCELLANEOUS) ×3 IMPLANT
GLOVE SURG LX 7.5 STRW (GLOVE) ×4
GLOVE SURG LX STRL 7.5 STRW (GLOVE) ×1 IMPLANT
GLOVE SURG TRIUMPH 8.0 PF LTX (GLOVE) ×3 IMPLANT
GOWN STRL REUS W/ TWL LRG LVL3 (GOWN DISPOSABLE) ×2 IMPLANT
GOWN STRL REUS W/TWL LRG LVL3 (GOWN DISPOSABLE) ×6
LENS IOL ACRSF VT TRC 415 15.5 IMPLANT
LENS IOL ACRYSOF VIVITY 15.5 ×3 IMPLANT
LENS IOL VIVITY 415 15.5 ×1 IMPLANT
MARKER SKIN DUAL TIP RULER LAB (MISCELLANEOUS) ×3 IMPLANT
NDL CAPSULORHEX 25GA (NEEDLE) ×1 IMPLANT
NDL FILTER BLUNT 18X1 1/2 (NEEDLE) ×2 IMPLANT
NDL RETROBULBAR .5 NSTRL (NEEDLE) IMPLANT
NEEDLE CAPSULORHEX 25GA (NEEDLE) ×3 IMPLANT
NEEDLE FILTER BLUNT 18X 1/2SAF (NEEDLE) ×4
NEEDLE FILTER BLUNT 18X1 1/2 (NEEDLE) ×2 IMPLANT
PACK CATARACT BRASINGTON (MISCELLANEOUS) ×3 IMPLANT
PACK EYE AFTER SURG (MISCELLANEOUS) ×3 IMPLANT
PACK OPTHALMIC (MISCELLANEOUS) ×3 IMPLANT
RING MALYGIN 7.0 (MISCELLANEOUS) IMPLANT
SOLUTION OPHTHALMIC SALT (MISCELLANEOUS) ×3 IMPLANT
SUT ETHILON 10-0 CS-B-6CS-B-6 (SUTURE)
SUT VICRYL  9 0 (SUTURE)
SUT VICRYL 9 0 (SUTURE) IMPLANT
SUTURE EHLN 10-0 CS-B-6CS-B-6 (SUTURE) IMPLANT
SYR 3ML LL SCALE MARK (SYRINGE) ×6 IMPLANT
SYR TB 1ML LUER SLIP (SYRINGE) ×3 IMPLANT
WATER STERILE IRR 250ML POUR (IV SOLUTION) ×3 IMPLANT
WIPE NON LINTING 3.25X3.25 (MISCELLANEOUS) ×3 IMPLANT

## 2020-05-13 NOTE — H&P (Signed)

## 2020-05-13 NOTE — Anesthesia Postprocedure Evaluation (Signed)
Anesthesia Post Note  Patient: [name]  Procedure(s) Performed: CATARACT EXTRACTION PHACO AND INTRAOCULAR LENS PLACEMENT (IOC) LEFT (Left Eye)     Patient location during evaluation: PACU Anesthesia Type: MAC Level of consciousness: awake and alert Pain management: pain level controlled Vital Signs Assessment: post-procedure vital signs reviewed and stable Respiratory status: spontaneous breathing, nonlabored ventilation, respiratory function stable and patient connected to nasal cannula oxygen Cardiovascular status: stable and blood pressure returned to baseline Postop Assessment: no apparent nausea or vomiting Anesthetic complications: no   No complications documented.  Trecia Rogers

## 2020-05-13 NOTE — Anesthesia Procedure Notes (Signed)
Procedure Name: MAC Performed by: Toddy Boyd M, CRNA Pre-anesthesia Checklist: Timeout performed, Patient being monitored, Suction available, Emergency Drugs available and Patient identified Patient Re-evaluated:Patient Re-evaluated prior to induction Oxygen Delivery Method: Nasal cannula       

## 2020-05-13 NOTE — Transfer of Care (Signed)
Immediate Anesthesia Transfer of Care Note  Patient: [name]  Procedure(s) Performed: CATARACT EXTRACTION PHACO AND INTRAOCULAR LENS PLACEMENT (IOC) LEFT (Left Eye)  Patient Location: PACU  Anesthesia Type: MAC  Level of Consciousness: awake, alert  and patient cooperative  Airway and Oxygen Therapy: Patient Spontanous Breathing and Patient connected to supplemental oxygen  Post-op Assessment: Post-op Vital signs reviewed, Patient's Cardiovascular Status Stable, Respiratory Function Stable, Patent Airway and No signs of Nausea or vomiting  Post-op Vital Signs: Reviewed and stable  Complications: No complications documented.

## 2020-05-13 NOTE — Op Note (Signed)
LOCATION:  [place]   PREOPERATIVE DIAGNOSIS:  Nuclear sclerotic cataract of the left eye.  H25.12  POSTOPERATIVE DIAGNOSIS:  Nuclear sclerotic cataract of the left eye.   PROCEDURE:  Phacoemulsification with Toric posterior chamber intraocular lens placement of the left eye.  Ultrasound time: Procedure(s) with comments: CATARACT EXTRACTION PHACO AND INTRAOCULAR LENS PLACEMENT (IOC) LEFT (Left) - 6.61 0:53.0 12.4% LENS: DFT415 V15.5 VivityToric intraocular lens with 2.25 diopters of cylindrical power with axis orientation at 176 degrees.   SURGEON:  Wyonia Hough, MD   ANESTHESIA:  Topical with tetracaine drops and 2% Xylocaine jelly, augmented with 1% preservative-free intracameral lidocaine.  COMPLICATIONS:  None.   DESCRIPTION OF PROCEDURE:  The patient was identified in the holding room and transported to the operating suite and placed in the supine position under the operating microscope.  The left eye was identified as the operative eye, and it was prepped and draped in the usual sterile ophthalmic fashion.    A clear-corneal paracentesis incision was made at the 1:30 position.  0.5 ml of preservative-free 1% lidocaine was injected into the anterior chamber. The anterior chamber was filled with Viscoat.  A 2.4 millimeter near clear corneal incision was then made at the 10:30 position.  A cystotome and capsulorrhexis forceps were then used to make a curvilinear capsulorrhexis.  Hydrodissection and hydrodelineation were then performed using balanced salt solution.   Phacoemulsification was then used in stop and chop fashion to remove the lens, nucleus and epinucleus.  The remaining cortex was aspirated using the irrigation and aspiration handpiece.  Provisc viscoelastic was then placed into the capsular bag to distend it for lens placement.  The Verion digital marker was used to align the implant at the intended axis.   A 15.5 diopter lens was then injected into  the capsular bag.  It was rotated clockwise until the axis marks on the lens were approximately 15 degrees in the counterclockwise direction to the intended alignment.  The viscoelastic was aspirated from the eye using the irrigation aspiration handpiece.  Then, a Koch spatula through the sideport incision was used to rotate the lens in a clockwise direction until the axis markings of the intraocular lens were lined up with the Verion alignment.  Balanced salt solution was then used to hydrate the wounds. Cefuroxime 0.1 ml of a 10mg /ml solution was injected into the anterior chamber for a dose of 1 mg of intracameral antibiotic at the completion of the case.    The eye was noted to have a physiologic pressure and there was no wound leak noted.   Timolol and Brimonidine drops were applied to the eye.  The patient was taken to the recovery room in stable condition having had no complications of anesthesia or surgery.  [name] 05/13/2020, 11:36 AM

## 2020-05-13 NOTE — Anesthesia Preprocedure Evaluation (Signed)
Anesthesia Evaluation  Patient identified by MRN, date of birth, ID band Patient awake    Reviewed: Allergy & Precautions, H&P , NPO status , Patient's Chart, lab work & pertinent test results, reviewed documented beta blocker date and time   History of Anesthesia Complications (+) PONV and history of anesthetic complications  Airway Mallampati: II  TM Distance: >3 FB Neck ROM: full    Dental no notable dental hx.    Pulmonary neg pulmonary ROS,    Pulmonary exam normal breath sounds clear to auscultation       Cardiovascular Exercise Tolerance: Good hypertension, Normal cardiovascular exam Rhythm:regular Rate:Normal     Neuro/Psych negative neurological ROS  negative psych ROS   GI/Hepatic Neg liver ROS, GERD  ,  Endo/Other  negative endocrine ROS  Renal/GU negative Renal ROS  negative genitourinary   Musculoskeletal   Abdominal   Peds  Hematology negative hematology ROS (+)   Anesthesia Other Findings   Reproductive/Obstetrics negative OB ROS                             Anesthesia Physical Anesthesia Plan  ASA: II  Anesthesia Plan: MAC   Post-op Pain Management:    Induction:   PONV Risk Score and Plan:   Airway Management Planned:   Additional Equipment:   Intra-op Plan:   Post-operative Plan:   Informed Consent: I have reviewed the patients History and Physical, chart, labs and discussed the procedure including the risks, benefits and alternatives for the proposed anesthesia with the patient or authorized representative who has indicated his/her understanding and acceptance.     Dental Advisory Given  Plan Discussed with: CRNA  Anesthesia Plan Comments:         Anesthesia Quick Evaluation

## 2020-05-14 ENCOUNTER — Encounter: Payer: Self-pay | Admitting: Ophthalmology

## 2020-05-22 DIAGNOSIS — Z20822 Contact with and (suspected) exposure to covid-19: Secondary | ICD-10-CM | POA: Diagnosis not present

## 2020-05-22 DIAGNOSIS — Z03818 Encounter for observation for suspected exposure to other biological agents ruled out: Secondary | ICD-10-CM | POA: Diagnosis not present

## 2020-05-22 DIAGNOSIS — Z20828 Contact with and (suspected) exposure to other viral communicable diseases: Secondary | ICD-10-CM | POA: Diagnosis not present

## 2020-05-25 DIAGNOSIS — H2511 Age-related nuclear cataract, right eye: Secondary | ICD-10-CM | POA: Diagnosis not present

## 2020-05-29 DIAGNOSIS — Z20828 Contact with and (suspected) exposure to other viral communicable diseases: Secondary | ICD-10-CM | POA: Diagnosis not present

## 2020-05-29 DIAGNOSIS — Z20822 Contact with and (suspected) exposure to covid-19: Secondary | ICD-10-CM | POA: Diagnosis not present

## 2020-05-29 DIAGNOSIS — Z03818 Encounter for observation for suspected exposure to other biological agents ruled out: Secondary | ICD-10-CM | POA: Diagnosis not present

## 2020-06-01 DIAGNOSIS — Z20828 Contact with and (suspected) exposure to other viral communicable diseases: Secondary | ICD-10-CM | POA: Diagnosis not present

## 2020-06-01 DIAGNOSIS — Z03818 Encounter for observation for suspected exposure to other biological agents ruled out: Secondary | ICD-10-CM | POA: Diagnosis not present

## 2020-06-01 DIAGNOSIS — Z20822 Contact with and (suspected) exposure to covid-19: Secondary | ICD-10-CM | POA: Diagnosis not present

## 2020-06-03 ENCOUNTER — Encounter: Payer: Self-pay | Admitting: Ophthalmology

## 2020-06-03 ENCOUNTER — Other Ambulatory Visit: Payer: Self-pay | Admitting: Internal Medicine

## 2020-06-08 ENCOUNTER — Other Ambulatory Visit: Payer: Self-pay

## 2020-06-08 ENCOUNTER — Other Ambulatory Visit
Admission: RE | Admit: 2020-06-08 | Discharge: 2020-06-08 | Disposition: A | Discharge status: Home / Self Care | Payer: Medicare Other | Source: Ambulatory Visit | Attending: Ophthalmology | Admitting: Ophthalmology

## 2020-06-08 DIAGNOSIS — Z20822 Contact with and (suspected) exposure to covid-19: Secondary | ICD-10-CM | POA: Diagnosis not present

## 2020-06-08 DIAGNOSIS — Z01812 Encounter for preprocedural laboratory examination: Secondary | ICD-10-CM | POA: Diagnosis not present

## 2020-06-08 LAB — SARS CORONAVIRUS 2 (TAT 6-24 HRS): SARS Coronavirus 2: NEGATIVE

## 2020-06-08 NOTE — Discharge Instructions (Signed)

## 2020-06-10 ENCOUNTER — Ambulatory Visit
Admission: RE | Admit: 2020-06-10 | Discharge: 2020-06-10 | Disposition: A | Discharge status: Home / Self Care | Payer: Medicare Other | Attending: Ophthalmology | Admitting: Ophthalmology

## 2020-06-10 ENCOUNTER — Encounter
Admission: RE | Disposition: A | Discharge status: Home / Self Care | Payer: Self-pay | Source: Home / Self Care | Attending: Ophthalmology

## 2020-06-10 ENCOUNTER — Encounter: Payer: Self-pay | Admitting: Ophthalmology

## 2020-06-10 ENCOUNTER — Ambulatory Visit: Payer: Medicare Other | Admitting: Anesthesiology

## 2020-06-10 ENCOUNTER — Other Ambulatory Visit: Payer: Self-pay

## 2020-06-10 DIAGNOSIS — Z79899 Other long term (current) drug therapy: Secondary | ICD-10-CM | POA: Diagnosis not present

## 2020-06-10 DIAGNOSIS — H2511 Age-related nuclear cataract, right eye: Secondary | ICD-10-CM | POA: Insufficient documentation

## 2020-06-10 DIAGNOSIS — M1990 Unspecified osteoarthritis, unspecified site: Secondary | ICD-10-CM | POA: Insufficient documentation

## 2020-06-10 DIAGNOSIS — Z791 Long term (current) use of non-steroidal anti-inflammatories (NSAID): Secondary | ICD-10-CM | POA: Diagnosis not present

## 2020-06-10 DIAGNOSIS — Z882 Allergy status to sulfonamides status: Secondary | ICD-10-CM | POA: Insufficient documentation

## 2020-06-10 DIAGNOSIS — H25811 Combined forms of age-related cataract, right eye: Secondary | ICD-10-CM | POA: Diagnosis not present

## 2020-06-10 DIAGNOSIS — Z91041 Radiographic dye allergy status: Secondary | ICD-10-CM | POA: Insufficient documentation

## 2020-06-10 DIAGNOSIS — Z9849 Cataract extraction status, unspecified eye: Secondary | ICD-10-CM | POA: Diagnosis not present

## 2020-06-10 DIAGNOSIS — Z888 Allergy status to other drugs, medicaments and biological substances status: Secondary | ICD-10-CM | POA: Diagnosis not present

## 2020-06-10 DIAGNOSIS — I10 Essential (primary) hypertension: Secondary | ICD-10-CM | POA: Insufficient documentation

## 2020-06-10 DIAGNOSIS — K219 Gastro-esophageal reflux disease without esophagitis: Secondary | ICD-10-CM | POA: Insufficient documentation

## 2020-06-10 HISTORY — PX: CATARACT EXTRACTION W/PHACO: SHX586

## 2020-06-10 SURGERY — PHACOEMULSIFICATION, CATARACT, WITH IOL INSERTION
Anesthesia: Monitor Anesthesia Care | Site: Eye | Laterality: Right

## 2020-06-10 MED ORDER — TETRACAINE HCL 0.5 % OP SOLN
1.0000 [drp] | OPHTHALMIC | Status: DC | PRN
  Administered 2020-06-10 (×2): 1 [drp] via OPHTHALMIC

## 2020-06-10 MED ORDER — NA HYALUR & NA CHOND-NA HYALUR 0.4-0.35 ML IO KIT
PACK | INTRAOCULAR | Status: DC | PRN
  Administered 2020-06-10: 1 mL via INTRAOCULAR

## 2020-06-10 MED ORDER — MOXIFLOXACIN HCL 0.5 % OP SOLN
1.0000 [drp] | OPHTHALMIC | Status: DC | PRN
  Administered 2020-06-10 (×3): 1 [drp] via OPHTHALMIC

## 2020-06-10 MED ORDER — MOXIFLOXACIN HCL 0.5 % OP SOLN
OPHTHALMIC | Status: DC | PRN
  Administered 2020-06-10: 0.2 mL via OPHTHALMIC

## 2020-06-10 MED ORDER — MIDAZOLAM HCL 2 MG/2ML IJ SOLN
INTRAMUSCULAR | Status: DC | PRN
  Administered 2020-06-10: 2 mg via INTRAVENOUS

## 2020-06-10 MED ORDER — ARMC OPHTHALMIC DILATING DROPS
1.0000 "application " | OPHTHALMIC | Status: DC | PRN
  Administered 2020-06-10 (×3): 1 via OPHTHALMIC

## 2020-06-10 MED ORDER — FENTANYL CITRATE (PF) 100 MCG/2ML IJ SOLN
INTRAMUSCULAR | Status: DC | PRN
  Administered 2020-06-10: 100 ug via INTRAVENOUS

## 2020-06-10 MED ORDER — LACTATED RINGERS IV SOLN: INTRAVENOUS | Status: DC

## 2020-06-10 MED ORDER — EPINEPHRINE PF 1 MG/ML IJ SOLN
INTRAOCULAR | Status: DC | PRN
  Administered 2020-06-10: 57 mL via OPHTHALMIC

## 2020-06-10 MED ORDER — BRIMONIDINE TARTRATE-TIMOLOL 0.2-0.5 % OP SOLN
OPHTHALMIC | Status: DC | PRN
  Administered 2020-06-10: 1 [drp] via OPHTHALMIC

## 2020-06-10 MED ORDER — LIDOCAINE HCL (PF) 2 % IJ SOLN
INTRAOCULAR | Status: DC | PRN
  Administered 2020-06-10: 1 mL

## 2020-06-10 SURGICAL SUPPLY — 30 items
CANNULA ANT/CHMB 27G (MISCELLANEOUS) ×1 IMPLANT
CANNULA ANT/CHMB 27GA (MISCELLANEOUS) ×3 IMPLANT
GLOVE SURG LX 7.5 STRW (GLOVE) ×6
GLOVE SURG LX STRL 7.5 STRW (GLOVE) ×1 IMPLANT
GLOVE SURG TRIUMPH 8.0 PF LTX (GLOVE) ×3 IMPLANT
GOWN STRL REUS W/ TWL LRG LVL3 (GOWN DISPOSABLE) ×2 IMPLANT
GOWN STRL REUS W/TWL LRG LVL3 (GOWN DISPOSABLE) ×9
LENS IOL ACRSF VT TRC 315 15.0 IMPLANT
LENS IOL ACRYSOF VIVITY 15.0 ×3 IMPLANT
LENS IOL VIVITY 315 15.0 ×1 IMPLANT
MARKER SKIN DUAL TIP RULER LAB (MISCELLANEOUS) ×3 IMPLANT
NDL CAPSULORHEX 25GA (NEEDLE) ×1 IMPLANT
NDL FILTER BLUNT 18X1 1/2 (NEEDLE) ×2 IMPLANT
NDL RETROBULBAR .5 NSTRL (NEEDLE) IMPLANT
NEEDLE CAPSULORHEX 25GA (NEEDLE) ×3 IMPLANT
NEEDLE FILTER BLUNT 18X 1/2SAF (NEEDLE) ×4
NEEDLE FILTER BLUNT 18X1 1/2 (NEEDLE) ×2 IMPLANT
PACK CATARACT BRASINGTON (MISCELLANEOUS) ×3 IMPLANT
PACK EYE AFTER SURG (MISCELLANEOUS) ×3 IMPLANT
PACK OPTHALMIC (MISCELLANEOUS) ×3 IMPLANT
RING MALYGIN 7.0 (MISCELLANEOUS) IMPLANT
SOLUTION OPHTHALMIC SALT (MISCELLANEOUS) ×3 IMPLANT
SUT ETHILON 10-0 CS-B-6CS-B-6 (SUTURE)
SUT VICRYL  9 0 (SUTURE)
SUT VICRYL 9 0 (SUTURE) IMPLANT
SUTURE EHLN 10-0 CS-B-6CS-B-6 (SUTURE) IMPLANT
SYR 3ML LL SCALE MARK (SYRINGE) ×6 IMPLANT
SYR TB 1ML LUER SLIP (SYRINGE) ×3 IMPLANT
WATER STERILE IRR 250ML POUR (IV SOLUTION) ×3 IMPLANT
WIPE NON LINTING 3.25X3.25 (MISCELLANEOUS) ×3 IMPLANT

## 2020-06-10 NOTE — Op Note (Signed)
LOCATION:  [place]   PREOPERATIVE DIAGNOSIS:  Nuclear sclerotic cataract of the right eye.  H25.11   POSTOPERATIVE DIAGNOSIS:  Nuclear sclerotic cataract of the right eye.   PROCEDURE:  Phacoemulsification with Toric posterior chamber intraocular lens placement of the right eye.  Ultrasound time: Procedure(s) with comments: CATARACT EXTRACTION PHACO AND INTRAOCULAR LENS PLACEMENT (IOC) RIGHT VIVITY TORIC LENS (Right) - 9.04 1:12.8 12.4%  LENS:   Implant Name Type Inv. Item Serial No. Manufacturer Lot No. LRB No. Used Action  ACRYSOF IQ VIVITY TORIC IOL Intraocular Lens  [id number] ALCON  Right 1 Implanted     [id number] Vivity Toric intraocular lens with 1.5 diopters of cylindrical power with axis orientation at 10 degrees.    SURGEON:  Wyonia Hough, MD   ANESTHESIA: Topical with tetracaine drops and 2% Xylocaine jelly, augmented with 1% preservative-free intracameral lidocaine. .   COMPLICATIONS:  None.   DESCRIPTION OF PROCEDURE:  The patient was identified in the holding room and transported to the operating suite and placed in the supine position under the operating microscope.  The right eye was identified as the operative eye, and it was prepped and draped in the usual sterile ophthalmic fashion.    A clear-corneal paracentesis incision was made at the 12:00 position.  0.5 ml of preservative-free 1% lidocaine was injected into the anterior chamber. The anterior chamber was filled with Viscoat.  A 2.4 millimeter near clear corneal incision was then made at the 9:00 position.  A cystotome and capsulorrhexis forceps were then used to make a curvilinear capsulorrhexis.  Hydrodissection and hydrodelineation were then performed using balanced salt solution.   Phacoemulsification was then used in stop and chop fashion to remove the lens, nucleus and epinucleus.  The remaining cortex was aspirated using the irrigation and aspiration handpiece.  Provisc viscoelastic  was then placed into the capsular bag to distend it for lens placement.  The Verion digital marker was used to align the implant at the intended axis.   A Toric lens was then injected into the capsular bag.  It was rotated clockwise until the axis marks on the lens were approximately 15 degrees in the counterclockwise direction to the intended alignment.  The viscoelastic was aspirated from the eye using the irrigation aspiration handpiece.  Then, a Koch spatula through the sideport incision was used to rotate the lens in a clockwise direction until the axis markings of the intraocular lens were lined up with the Verion alignment.  Balanced salt solution was then used to hydrate the wounds. Vigamox 0.2 ml of a 1mg  per ml solution was injected into the anterior chamber for a dose of 0.2 mg of intracameral antibiotic at the completion of the case.    The eye was noted to have a physiologic pressure and there was no wound leak noted.   Timolol and Brimonidine drops were applied to the eye.  The patient was taken to the recovery room in stable condition having had no complications of anesthesia or surgery.  [name] 06/10/2020, 1:47 PM

## 2020-06-10 NOTE — Transfer of Care (Signed)
Immediate Anesthesia Transfer of Care Note  Patient: [name]  Procedure(s) Performed: CATARACT EXTRACTION PHACO AND INTRAOCULAR LENS PLACEMENT (IOC) RIGHT VIVITY TORIC LENS (Right Eye)  Patient Location: PACU  Anesthesia Type: MAC  Level of Consciousness: awake, alert  and patient cooperative  Airway and Oxygen Therapy: Patient Spontanous Breathing and Patient connected to supplemental oxygen  Post-op Assessment: Post-op Vital signs reviewed, Patient's Cardiovascular Status Stable, Respiratory Function Stable, Patent Airway and No signs of Nausea or vomiting  Post-op Vital Signs: Reviewed and stable  Complications: No complications documented.

## 2020-06-10 NOTE — Anesthesia Postprocedure Evaluation (Signed)
Anesthesia Post Note  Patient: [name]  Procedure(s) Performed: CATARACT EXTRACTION PHACO AND INTRAOCULAR LENS PLACEMENT (IOC) RIGHT VIVITY TORIC LENS (Right Eye)     Anesthesia Post Evaluation No complications documented.  Denyla Cortese [employer]

## 2020-06-10 NOTE — Anesthesia Procedure Notes (Signed)
Procedure Name: MAC Date/Time: 06/10/2020 1:30 PM Performed by: Silvana Newness, CRNA Pre-anesthesia Checklist: Patient identified, Emergency Drugs available, Suction available, Patient being monitored and Timeout performed Patient Re-evaluated:Patient Re-evaluated prior to induction Oxygen Delivery Method: Nasal cannula Placement Confirmation: positive ETCO2

## 2020-06-10 NOTE — Anesthesia Preprocedure Evaluation (Signed)
Anesthesia Evaluation  Patient identified by MRN, date of birth, ID band Patient awake    Reviewed: Allergy & Precautions, H&P , NPO status , Patient's Chart, lab work & pertinent test results, reviewed documented beta blocker date and time   History of Anesthesia Complications (+) PONV and history of anesthetic complications  Airway Mallampati: II  TM Distance: >3 FB Neck ROM: full    Dental no notable dental hx.    Pulmonary neg pulmonary ROS,    Pulmonary exam normal        Cardiovascular Exercise Tolerance: Good hypertension, Normal cardiovascular exam     Neuro/Psych negative neurological ROS  negative psych ROS   GI/Hepatic Neg liver ROS, GERD  Controlled,  Endo/Other  negative endocrine ROS  Renal/GU negative Renal ROS  negative genitourinary   Musculoskeletal  (+) Arthritis ,   Abdominal Normal abdominal exam  (+)   Peds  Hematology negative hematology ROS (+)   Anesthesia Other Findings   Reproductive/Obstetrics negative OB ROS                             Anesthesia Physical  Anesthesia Plan  ASA: II  Anesthesia Plan: MAC   Post-op Pain Management:    Induction:   PONV Risk Score and Plan: 3 and TIVA, Midazolam and Treatment may vary due to age or medical condition  Airway Management Planned: Nasal Cannula  Additional Equipment: None  Intra-op Plan:   Post-operative Plan:   Informed Consent: I have reviewed the patients History and Physical, chart, labs and discussed the procedure including the risks, benefits and alternatives for the proposed anesthesia with the patient or authorized representative who has indicated his/her understanding and acceptance.     Dental Advisory Given  Plan Discussed with: CRNA  Anesthesia Plan Comments:         Anesthesia Quick Evaluation

## 2020-06-10 NOTE — H&P (Signed)

## 2020-06-11 ENCOUNTER — Encounter: Payer: Self-pay | Admitting: Ophthalmology

## 2020-06-23 DIAGNOSIS — Z20828 Contact with and (suspected) exposure to other viral communicable diseases: Secondary | ICD-10-CM | POA: Diagnosis not present

## 2020-06-23 DIAGNOSIS — Z20822 Contact with and (suspected) exposure to covid-19: Secondary | ICD-10-CM | POA: Diagnosis not present

## 2020-06-23 DIAGNOSIS — R519 Headache, unspecified: Secondary | ICD-10-CM | POA: Diagnosis not present

## 2020-06-23 DIAGNOSIS — Z03818 Encounter for observation for suspected exposure to other biological agents ruled out: Secondary | ICD-10-CM | POA: Diagnosis not present

## 2020-07-15 DIAGNOSIS — Z03818 Encounter for observation for suspected exposure to other biological agents ruled out: Secondary | ICD-10-CM | POA: Diagnosis not present

## 2020-07-15 DIAGNOSIS — Z20822 Contact with and (suspected) exposure to covid-19: Secondary | ICD-10-CM | POA: Diagnosis not present

## 2020-07-15 DIAGNOSIS — R519 Headache, unspecified: Secondary | ICD-10-CM | POA: Diagnosis not present

## 2020-07-22 DIAGNOSIS — Z23 Encounter for immunization: Secondary | ICD-10-CM | POA: Diagnosis not present

## 2020-08-13 ENCOUNTER — Other Ambulatory Visit: Payer: Self-pay | Admitting: Internal Medicine

## 2020-08-13 DIAGNOSIS — R350 Frequency of micturition: Secondary | ICD-10-CM

## 2020-08-14 ENCOUNTER — Other Ambulatory Visit (INDEPENDENT_AMBULATORY_CARE_PROVIDER_SITE_OTHER): Payer: Medicare Other

## 2020-08-14 ENCOUNTER — Other Ambulatory Visit: Payer: Self-pay

## 2020-08-14 DIAGNOSIS — R350 Frequency of micturition: Secondary | ICD-10-CM

## 2020-08-14 LAB — URINALYSIS, ROUTINE W REFLEX MICROSCOPIC
Bilirubin Urine: NEGATIVE
Hgb urine dipstick: NEGATIVE
Ketones, ur: NEGATIVE
Leukocytes,Ua: NEGATIVE
Nitrite: NEGATIVE
Specific Gravity, Urine: 1.025 (ref 1.000–1.030)
Total Protein, Urine: NEGATIVE
Urine Glucose: NEGATIVE
Urobilinogen, UA: 0.2 (ref 0.0–1.0)
pH: 6 (ref 5.0–8.0)

## 2020-08-15 LAB — URINE CULTURE
MICRO NUMBER:: 11107378
SPECIMEN QUALITY:: ADEQUATE

## 2020-08-15 NOTE — Progress Notes (Signed)
Thus far, There is no evidence of UTI by urinalysis.  However the culture is still pending  And will take another 24 hours.   Regards,   Deborra Medina, MD

## 2020-08-16 DIAGNOSIS — Z23 Encounter for immunization: Secondary | ICD-10-CM | POA: Diagnosis not present

## 2020-08-17 ENCOUNTER — Ambulatory Visit: Payer: Medicare Other | Admitting: Urology

## 2020-08-17 NOTE — Progress Notes (Signed)
There is no evidence of UTI by culture results. If you are having symptoms of blood in your urine or pain in your bladder , please let me know, because I would  recommend  seeing a urologist to have the lining of your bladder examined with cystoscopy. I will make the referral for you to see Dr [name] ( or another urologist if you have a preference). Just let me know.   If your symptoms are limited to burning, please make an appointment with me or your gynecologist for a pelvic exam, because this may be due to atrophic or other forms of vaginitis .  Regards,   Debra Medina, MD

## 2020-08-18 ENCOUNTER — Other Ambulatory Visit: Payer: Self-pay | Admitting: *Deleted

## 2020-08-18 DIAGNOSIS — N200 Calculus of kidney: Secondary | ICD-10-CM

## 2020-08-19 ENCOUNTER — Ambulatory Visit
Admission: RE | Admit: 2020-08-19 | Discharge: 2020-08-19 | Disposition: A | Discharge status: Home / Self Care | Payer: Medicare Other | Attending: Urology | Admitting: Urology

## 2020-08-19 ENCOUNTER — Ambulatory Visit
Admission: RE | Admit: 2020-08-19 | Discharge: 2020-08-19 | Disposition: A | Discharge status: Home / Self Care | Payer: Medicare Other | Source: Ambulatory Visit | Attending: Urology | Admitting: Urology

## 2020-08-19 ENCOUNTER — Other Ambulatory Visit: Payer: Self-pay

## 2020-08-19 ENCOUNTER — Encounter: Payer: Self-pay | Admitting: Urology

## 2020-08-19 ENCOUNTER — Ambulatory Visit (INDEPENDENT_AMBULATORY_CARE_PROVIDER_SITE_OTHER): Payer: Medicare Other | Admitting: Urology

## 2020-08-19 VITALS — BP 134/83 | HR 80 | Ht 61.0 in | Wt 116.8 lb

## 2020-08-19 DIAGNOSIS — N200 Calculus of kidney: Secondary | ICD-10-CM | POA: Insufficient documentation

## 2020-08-19 DIAGNOSIS — N3281 Overactive bladder: Secondary | ICD-10-CM | POA: Diagnosis not present

## 2020-08-19 DIAGNOSIS — Z981 Arthrodesis status: Secondary | ICD-10-CM | POA: Diagnosis not present

## 2020-08-19 LAB — BLADDER SCAN AMB NON-IMAGING: Scan Result: 37

## 2020-08-19 NOTE — Progress Notes (Signed)
08/19/2020 2:02 PM   [name] [birth year] [id number]  Referring provider: [name], MD [name],  [place] [address]  Chief Complaint  Patient presents with  . Nephrolithiasis    Urologic history: 1.  Bilateral nephrolithiasis -Shockwave lithotripsy 03/2019 right distal ureteral calculus -Bilateral nonobstructing renal calculi -Stone analysis 85% CaOxMono -Metabolic evaluation low urine volume 1.43 L and hypocitraturia   HPI: 69 y.o. female presents for follow-up   Has twinges of left flank pain since last visit  No severe renal colic  Does have bothersome urinary frequency and urgency  No dysuria or gross hematuria  Remains on LithoLyte  Had urinalysis with PCP 08/14/2020 which was unremarkable and urine culture without significant growth.  Dr. Derrel Nip did recommend discussing role of cystoscopy for further evaluation  She is considering having her left-sided stones treated and wanted to discuss options   PMH: Past Medical History:  Diagnosis Date  . Adenomatous polyps 2010   colon  . Allergy   . Arthritis   . Chicken pox   . Complication of anesthesia    took a while to wake up from lithotripsy  . GERD (gastroesophageal reflux disease)   . Hyperlipidemia   . Hypertension   . Kidney stone   . Pneumonia     Surgical History: Past Surgical History:  Procedure Laterality Date  . CATARACT EXTRACTION W/PHACO Left 05/13/2020   Procedure: CATARACT EXTRACTION PHACO AND INTRAOCULAR LENS PLACEMENT ([place]) LEFT;  Surgeon: [name], MD;  Location: [place];  Service: Ophthalmology;  Laterality: Left;  6.61 0:53.0 12.4%  . CATARACT EXTRACTION W/PHACO Right 06/10/2020   Procedure: CATARACT EXTRACTION PHACO AND INTRAOCULAR LENS PLACEMENT ([place]) RIGHT VIVITY TORIC LENS;  Surgeon: [name], MD;  Location: [facility];  Service: Ophthalmology;  Laterality: Right;  9.04 1:12.8 12.4%  .  COLONOSCOPY  2017  . EXTRACORPOREAL SHOCK WAVE LITHOTRIPSY Left 03/28/2019   Procedure: EXTRACORPOREAL SHOCK WAVE LITHOTRIPSY (ESWL);  Surgeon: [name], MD;  Location: [facility];  Service: Urology;  Laterality: Left;  . SPINE SURGERY      Home Medications:  Allergies as of 08/19/2020      Reactions   Contrast Media [iodinated Diagnostic Agents] Swelling   Iodine Swelling   Milk-related Compounds Diarrhea   Prednisolone Anxiety   Agitation to oral prednisone tapers    Corn-containing Products    headaches   Penicillins    Childhood does not know reaction   Sulfa Antibiotics Hives      Medication List       Accurate as of August 19, 2020  2:02 PM. If you have any questions, ask your nurse or doctor.        alendronate 70 MG tablet Commonly known as: FOSAMAX Take 1 tablet (70 mg total) by mouth every 7 (seven) days. Take with a full glass of water on an empty stomach.   amLODipine 2.5 MG tablet Commonly known as: NORVASC TAKE 1 TABLET AT BEDTIME   Cholecalciferol 100 MCG (4000 UT) Caps Take by mouth.   EPINEPHrine 0.3 mg/0.3 mL Soaj injection Commonly known as: EpiPen 2-Pak Inject 0.3 mLs (0.3 mg total) into the muscle as needed for anaphylaxis.   ezetimibe 10 MG tablet Commonly known as: ZETIA Take 1 tablet (10 mg total) by mouth daily.   Lithium Carbonate Powd   meloxicam 7.5 MG tablet Commonly known as: MOBIC Take 1 tablet (7.5 mg total) by mouth as needed.   mupirocin ointment 2 %  Commonly known as: BACTROBAN Place 1 application into the nose 2 (two) times daily. As needed   triamcinolone 55 MCG/ACT Aero nasal inhaler Commonly known as: NASACORT Place 2 sprays into the nose as needed.       Allergies:  Allergies  Allergen Reactions  . Contrast Media [Iodinated Diagnostic Agents] Swelling  . Iodine Swelling  . Milk-Related Compounds Diarrhea  . Prednisolone Anxiety    Agitation to oral prednisone tapers   . Corn-Containing Products      headaches  . Penicillins     Childhood does not know reaction  . Sulfa Antibiotics Hives    Family History: Family History  Problem Relation Age of Onset  . Cancer Mother   . Stroke Mother   . Heart disease Mother   . Hypertension Mother   . Diabetes Mother   . Breast cancer Mother 60  . Heart disease Father   . Hypertension Father     Social History:  reports that she has never smoked. She has never used smokeless tobacco. She reports current alcohol use of about 3.0 standard drinks of alcohol per week. She reports that she does not use drugs.   Physical Exam: BP 134/83   Pulse 80   Ht 5\' 1"  (1.549 m)   Wt 116 lb 12.8 oz (53 kg)   BMI 22.07 kg/m   Constitutional:  Alert and oriented, No acute distress. HEENT: [place] AT, moist mucus membranes.  Trachea midline, no masses. Cardiovascular: No clubbing, cyanosis, or edema. Respiratory: Normal respiratory effort, no increased work of breathing. Skin: No rashes, bruises or suspicious lesions. Neurologic: Grossly intact, no focal deficits, moving all 4 extremities. Psychiatric: Normal mood and affect.  Laboratory Data:  Urinalysis    Component Value Date/Time   COLORURINE YELLOW 08/14/2020 1023   APPEARANCEUR CLEAR 08/14/2020 1023   LABSPEC 1.025 08/14/2020 1023   PHURINE 6.0 08/14/2020 1023   GLUCOSEU NEGATIVE 08/14/2020 1023   HGBUR NEGATIVE 08/14/2020 1023   BILIRUBINUR NEGATIVE 08/14/2020 1023   BILIRUBINUR Negative 08/10/2015 1543   KETONESUR NEGATIVE 08/14/2020 1023   PROTEINUR NEGATIVE 05/26/2017 1052   UROBILINOGEN 0.2 08/14/2020 1023   NITRITE NEGATIVE 08/14/2020 [place] 08/14/2020 1023    Lab Results  Component Value Date   BACTERIA NONE SEEN 05/26/2017      Assessment & Plan:    1.  Bilateral nephrolithiasis  KUB today reviewed and cluster of left lower pole calcifications and a 3 mm right upper pole calcification  Management options were discussed including continued  surveillance, ureteroscopy and shockwave lithotripsy  We discussed possibility that shockwave lithotripsy may result in excellent fragmentation of the lower pole calculi however the fragments may not clear and although more invasive ureteroscopy may be a better option for nonobstructing lower calyceal calculi.  We discussed the procedure and potential risks of bleeding, infection and ureteral injury.  The need for ureteral stent and possible stent symptoms were discussed.  She would like to think over these options.  Will tentatively set up a 96-month follow-up with KUB  2.  Overactive bladder  We discussed this is the most likely etiology of her urinary frequency and urgency  Bladder scan PVR was 37 mL  Although cystoscopy can be performed her urinalysis has been normal and yield would be low with a normal UA  Management options were discussed including medications and pelvic floor physical therapy  She was interested in a trial of medication and given Myrbetriq samples 25 mg daily, she  will call back regarding efficacy   Abbie Sons, MD  [facility] [address], [place], [place] [address] [phone]

## 2020-08-21 ENCOUNTER — Other Ambulatory Visit: Payer: Self-pay | Admitting: Internal Medicine

## 2020-09-15 ENCOUNTER — Telehealth: Payer: Self-pay | Admitting: *Deleted

## 2020-09-15 NOTE — Telephone Encounter (Signed)
Pt calling back stating that the took the myrbetriq for 3 weeks and it works well for her, she's sleeping better and not running to the bathroom every 2 hours. Should she stay on this medication, per pt? Please advise

## 2020-09-15 NOTE — Telephone Encounter (Signed)
Myrbetriq 25 mg has been effective would continue and can send in Rx to her pharmacy

## 2020-09-16 ENCOUNTER — Encounter: Payer: Self-pay | Admitting: *Deleted

## 2020-09-16 NOTE — Telephone Encounter (Signed)
Send message to my chart .

## 2020-09-24 DIAGNOSIS — Z1152 Encounter for screening for COVID-19: Secondary | ICD-10-CM | POA: Diagnosis not present

## 2020-09-24 DIAGNOSIS — Z03818 Encounter for observation for suspected exposure to other biological agents ruled out: Secondary | ICD-10-CM | POA: Diagnosis not present

## 2020-10-15 DIAGNOSIS — Z1152 Encounter for screening for COVID-19: Secondary | ICD-10-CM | POA: Diagnosis not present

## 2020-10-15 DIAGNOSIS — Z03818 Encounter for observation for suspected exposure to other biological agents ruled out: Secondary | ICD-10-CM | POA: Diagnosis not present

## 2020-11-06 ENCOUNTER — Ambulatory Visit: Payer: PRIVATE HEALTH INSURANCE

## 2020-11-17 ENCOUNTER — Ambulatory Visit (INDEPENDENT_AMBULATORY_CARE_PROVIDER_SITE_OTHER): Payer: Medicare Other

## 2020-11-17 VITALS — BP 122/69 | Ht 61.0 in | Wt 116.0 lb

## 2020-11-17 DIAGNOSIS — Z0000 Encounter for general adult medical examination without abnormal findings: Secondary | ICD-10-CM | POA: Diagnosis not present

## 2020-11-17 NOTE — Progress Notes (Addendum)
Subjective:   [name] is a 70 y.o. female who presents for Medicare Annual (Subsequent) preventive examination.  Review of Systems    No ROS.  Medicare Wellness Virtual Visit.    Cardiac Risk Factors include: advanced age (>24men, >15 women);hypertension     Objective:    Today's Vitals   11/17/20 0835  BP: 122/69  Weight: 116 lb (52.6 kg)  Height: 5\' 1"  (1.549 m)   Body mass index is 21.92 kg/m.  Advanced Directives 11/17/2020 06/10/2020 05/13/2020 11/06/2019 03/28/2019 10/31/2018 10/26/2017  Does Patient Have a Medical Advance Directive? Yes Yes Yes Yes Yes Yes Yes  Type of Paramedic of [place];Living will [place];Living will [place];Living will [place];Living will [place];Living will Living will;Healthcare Power of [place];Living will  Does patient want to make changes to medical advance directive? No - Patient declined No - Patient declined - No - Patient declined No - Patient declined No - Patient declined No - Patient declined  Copy of [place] in Chart? Yes - validated most recent copy scanned in chart (See row information) Yes - validated most recent copy scanned in chart (See row information) Yes - validated most recent copy scanned in chart (See row information) No - copy requested - No - copy requested No - copy requested    Current Medications (verified) Outpatient Encounter Medications as of 11/17/2020  Medication Sig  . alendronate (FOSAMAX) 70 MG tablet Take 1 tablet (70 mg total) by mouth every 7 (seven) days. Take with a full glass of water on an empty stomach.  Marland Kitchen amLODipine (NORVASC) 2.5 MG tablet TAKE 1 TABLET AT BEDTIME  . Cholecalciferol 100 MCG (4000 UT) CAPS Take by mouth.  . EPINEPHrine (EPIPEN 2-PAK) 0.3 mg/0.3 mL IJ SOAJ injection Inject 0.3 mLs (0.3 mg total) into the muscle as needed for  anaphylaxis.  Marland Kitchen ezetimibe (ZETIA) 10 MG tablet TAKE 1 TABLET BY MOUTH EVERY DAY  . Lithium Carbonate POWD   . meloxicam (MOBIC) 7.5 MG tablet Take 1 tablet (7.5 mg total) by mouth as needed.  . mupirocin ointment (BACTROBAN) 2 % Place 1 application into the nose 2 (two) times daily. As needed  . triamcinolone (NASACORT) 55 MCG/ACT AERO nasal inhaler Place 2 sprays into the nose as needed.   No facility-administered encounter medications on file as of 11/17/2020.    Allergies (verified) Contrast media [iodinated diagnostic agents], Iodine, Milk-related compounds, Prednisolone, Corn-containing products, Penicillins, and Sulfa antibiotics   History: Past Medical History:  Diagnosis Date  . Adenomatous polyps 2010   colon  . Allergy   . Arthritis   . Chicken pox   . Complication of anesthesia    took a while to wake up from lithotripsy  . GERD (gastroesophageal reflux disease)   . Hyperlipidemia   . Hypertension   . Kidney stone   . Pneumonia    Past Surgical History:  Procedure Laterality Date  . CATARACT EXTRACTION W/PHACO Left 05/13/2020   Procedure: CATARACT EXTRACTION PHACO AND INTRAOCULAR LENS PLACEMENT ([place]) LEFT;  Surgeon: [name], MD;  Location: [place];  Service: Ophthalmology;  Laterality: Left;  6.61 0:53.0 12.4%  . CATARACT EXTRACTION W/PHACO Right 06/10/2020   Procedure: CATARACT EXTRACTION PHACO AND INTRAOCULAR LENS PLACEMENT ([place]) RIGHT VIVITY TORIC LENS;  Surgeon: [name], MD;  Location: [place];  Service: Ophthalmology;  Laterality: Right;  9.04 1:12.8 12.4%  .  COLONOSCOPY  2017  . EXTRACORPOREAL SHOCK WAVE LITHOTRIPSY Left 03/28/2019   Procedure: EXTRACORPOREAL SHOCK WAVE LITHOTRIPSY (ESWL);  Surgeon: [name], MD;  Location: [facility];  Service: Urology;  Laterality: Left;  . SPINE SURGERY     Family History  Problem Relation Age of Onset  . Cancer Mother   . Stroke Mother   . Heart disease Mother   .  Hypertension Mother   . Diabetes Mother   . Breast cancer Mother 93  . Heart disease Father   . Hypertension Father    Social History   Socioeconomic History  . Marital status: Married    Spouse name: Not on file  . Number of children: Not on file  . Years of education: Not on file  . Highest education level: Not on file  Occupational History  . Not on file  Tobacco Use  . Smoking status: Never Smoker  . Smokeless tobacco: Never Used  Substance and Sexual Activity  . Alcohol use: Yes    Alcohol/week: 3.0 standard drinks    Types: 3 Glasses of wine per week  . Drug use: No  . Sexual activity: Yes  Other Topics Concern  . Not on file  Social History Narrative  . Not on file   Social Determinants of Health   Financial Resource Strain: Low Risk   . Difficulty of Paying Living Expenses: Not hard at all  Food Insecurity: No Food Insecurity  . Worried About Charity fundraiser in the Last Year: Never true  . Ran Out of Food in the Last Year: Never true  Transportation Needs: No Transportation Needs  . Lack of Transportation (Medical): No  . Lack of Transportation (Non-Medical): No  Physical Activity: Not on file  Stress: No Stress Concern Present  . Feeling of Stress : Not at all  Social Connections: Unknown  . Frequency of Communication with Friends and Family: Not on file  . Frequency of Social Gatherings with Friends and Family: Not on file  . Attends Religious Services: Not on file  . Active Member of Clubs or Organizations: Not on file  . Attends Archivist Meetings: Not on file  . Marital Status: Married    Tobacco Counseling Counseling given: Not Answered   Clinical Intake:  Pre-visit preparation completed: Yes        Diabetes: No  How often do you need to have someone help you when you read instructions, pamphlets, or other written materials from your doctor or pharmacy?: 1 - Never    Interpreter Needed?: No      Activities of  Daily Living In your present state of health, do you have any difficulty performing the following activities: 11/17/2020 06/10/2020  Hearing? N N  Vision? N N  Difficulty concentrating or making decisions? N N  Walking or climbing stairs? N N  Dressing or bathing? N N  Doing errands, shopping? N -  Preparing Food and eating ? N -  Using the Toilet? N -  In the past six months, have you accidently leaked urine? N -  Comment Managed with daily pad. Followed by Urology. -  Do you have problems with loss of bowel control? N -  Managing your Medications? N -  Managing your Finances? N -  Housekeeping or managing your Housekeeping? N -  Some recent data might be hidden    Patient Care Team: [name], MD as PCP - General (Internal Medicine)  Indicate any recent Medical Services you  may have received from other than [facility] providers in the past year (date may be approximate).     Assessment:   This is a routine wellness examination for Zilah.  I connected with Glorene today by telephone and verified that I am speaking with the correct person using two identifiers. Location patient: home Location provider: work Persons participating in the virtual visit: patient, Marine scientist.    I discussed the limitations, risks, security and privacy concerns of performing an evaluation and management service by telephone and the availability of in person appointments. The patient expressed understanding and verbally consented to this telephonic visit.    Interactive audio and video telecommunications were attempted between this provider and patient, however failed, due to patient having technical difficulties OR patient did not have access to video capability.  We continued and completed visit with audio only.  Some vital signs may be absent or patient reported.   Hearing/Vision screen  Hearing Screening   125Hz  250Hz  500Hz  1000Hz  2000Hz  3000Hz  4000Hz  6000Hz  8000Hz   Right ear:           Left ear:            Comments: Patient has difficulty hearing conversational tones in a crowd. Followed by [facility].  She does not wear hearing aids.   Vision Screening Comments: Followed by [facility]  Wears corrective lenses  Virtual visit.   Dietary issues and exercise activities discussed: Current Exercise Habits: Home exercise routine, Type of exercise: treadmill;walking, Intensity: Mild  Healthy diet Good water intake  Goals    . Follow up with Primary Care Provider     As needed      Depression Screen PHQ 2/9 Scores 11/17/2020 11/21/2019 11/06/2019 10/31/2018 10/26/2017 10/12/2016 10/15/2015  PHQ - 2 Score 0 0 0 0 0 0 0  PHQ- 9 Score - - - - 0 - -    Fall Risk Fall Risk  11/17/2020 03/10/2020 02/25/2020 11/21/2019 11/06/2019  Falls in the past year? 0 0 0 0 0  Comment - - - - -  Number falls in past yr: 0 - - - -  Injury with Fall? 0 - - - -  Follow up Falls evaluation completed Falls evaluation completed Falls evaluation completed Falls evaluation completed Falls evaluation completed    [place]: Handrails in use when climbing stairs? Yes Home free of loose throw rugs in walkways, pet beds, electrical cords, etc? Yes  Adequate lighting in your home to reduce risk of falls? Yes   ASSISTIVE DEVICES UTILIZED TO PREVENT FALLS: Life alert? No  Use of a cane, walker or w/c? No   TIMED UP AND GO: Was the test performed? No . Virtual visit.   Cognitive Function: MMSE - Mini Mental State Exam 11/06/2019 10/26/2017 10/15/2015  Not completed: Unable to complete - -  Orientation to time - 5 5  Orientation to Place - 5 5  Registration - 3 3  Attention/ Calculation - 5 5  Recall - 3 3  Language- name 2 objects - 2 2  Language- repeat - 1 1  Language- follow 3 step command - 3 3  Language- read & follow direction - 1 1  Write a sentence - 1 1  Copy design - 1 1  Total score - 30 30     6CIT Screen 10/31/2018 10/12/2016  What Year? 0 points 0  points  What month? 0 points 0 points  What time? 0 points 0 points  Count back from 20 0 points 0 points  Months in reverse 0 points 0 points  Repeat phrase 0 points -  Total Score 0 -    Immunizations Immunization History  Administered Date(s) Administered  . Fluad Quad(high Dose 65+) 06/24/2019  . Influenza Split 07/26/2012, 08/27/2013  . Influenza, High Dose Seasonal PF 08/25/2015, 08/15/2016, 07/15/2018  . Influenza-Unspecified 08/29/2014, 08/01/2017, 08/07/2018, 07/22/2020  . Moderna Sars-Covid-2 Vaccination 11/19/2019, 12/17/2019, 08/16/2020  . Pneumococcal Conjugate-13 09/02/2014  . Pneumococcal Polysaccharide-23 06/27/2007, 10/07/2015  . Tdap 12/25/2010  . Zoster 09/26/2013  . Zoster Recombinat (Shingrix) 12/11/2017, 04/21/2018    Health Maintenance There are no preventive care reminders to display for this patient. Health Maintenance  Topic Date Due  . COLONOSCOPY (Pts 45-63yrs Insurance coverage will need to be confirmed)  11/24/2020  . TETANUS/TDAP  12/24/2020  . MAMMOGRAM  03/04/2021  . INFLUENZA VACCINE  Completed  . DEXA SCAN  Completed  . COVID-19 Vaccine  Completed  . Hepatitis C Screening  Completed  . PNA vac Low Risk Adult  Completed   Colorectal cancer screening: Type of screening: Colonoscopy. Completed 11/24/17. Repeat every 3 years. Patients requests Dr. Allen Norris.   Mammogram status: Completed 03/04/20. Repeat every year  Bone density- 07/18/18. Fosamax.  Lung Cancer Screening: (Low Dose CT Chest recommended if Age 84-80 years, 30 pack-year currently smoking OR have quit w/in 15years.) does not qualify.   Hepatitis C Screening: Completed 10/07/15.  Vision Screening: Recommended annual ophthalmology exams for early detection of glaucoma and other disorders of the eye. Is the patient up to date with their annual eye exam?  Yes  Who is the provider or what is the name of the office in which the patient attends annual eye exams? [facility].  Cataract surgery, bilateral. Dr. Wallace Going.   Dental Screening: Recommended annual dental exams for proper oral hygiene.  Community Resource Referral / Chronic Care Management: CRR required this visit?  No   CCM required this visit?  No      Plan:   Keep all routine maintenance appointments.   I have personally reviewed and noted the following in the patient's chart:   . Medical and social history . Use of alcohol, tobacco or illicit drugs  . Current medications and supplements . Functional ability and status . Nutritional status . Physical activity . Advanced directives . List of other physicians . Hospitalizations, surgeries, and ER visits in previous 12 months . Vitals . Screenings to include cognitive, depression, and falls . Referrals and appointments  In addition, I have reviewed and discussed with patient certain preventive protocols, quality metrics, and best practice recommendations. A written personalized care plan for preventive services as well as general preventive health recommendations were provided to patient via mychart.     Varney Biles, LPN   [id number]    Nurse notes: Requests all medication mail orders be sent to address on file.  [address] [place].     Agree with plan. Mable Paris, NP

## 2020-11-17 NOTE — Patient Instructions (Addendum)
[name] , Thank you for taking time to come for your Medicare Wellness Visit. I appreciate your ongoing commitment to your health goals. Please review the following plan we discussed and let me know if I can assist you in the future.   These are the goals we discussed: Goals    . Follow up with Primary Care Provider     As needed       This is a list of the screening recommended for you and due dates:  Health Maintenance  Topic Date Due  . Colon Cancer Screening  11/24/2020  . Tetanus Vaccine  12/24/2020  . Mammogram  03/04/2021  . Flu Shot  Completed  . DEXA scan (bone density measurement)  Completed  . COVID-19 Vaccine  Completed  .  Hepatitis C: One time screening is recommended by Center for Disease Control  (CDC) for  adults born from [birth year] through 1967.   Completed  . Pneumonia vaccines  Completed     Immunizations Immunization History  Administered Date(s) Administered  . Fluad Quad(high Dose 65+) 06/24/2019  . Influenza Split 07/26/2012, 08/27/2013  . Influenza, High Dose Seasonal PF 08/25/2015, 08/15/2016, 07/15/2018  . Influenza-Unspecified 08/29/2014, 08/01/2017, 08/07/2018, 07/22/2020  . Moderna Sars-Covid-2 Vaccination 11/19/2019, 12/17/2019, 08/16/2020  . Pneumococcal Conjugate-13 09/02/2014  . Pneumococcal Polysaccharide-23 06/27/2007, 10/07/2015  . Tdap 12/25/2010  . Zoster 09/26/2013  . Zoster Recombinat (Shingrix) 12/11/2017, 04/21/2018   Advanced directives: on file.   Conditions/risks identified: none new.  Follow up in one year for your annual wellness visit.   Preventive Care 81 Years and Older, Female Preventive care refers to lifestyle choices and visits with your health care provider that can promote health and wellness. What does preventive care include?  A yearly physical exam. This is also called an annual well check.  Dental exams once or twice a year.  Routine eye exams. Ask your health care provider how often you should have your  eyes checked.  Personal lifestyle choices, including:  Daily care of your teeth and gums.  Regular physical activity.  Eating a healthy diet.  Avoiding tobacco and drug use.  Limiting alcohol use.  Practicing safe sex.  Taking low-dose aspirin every day.  Taking vitamin and mineral supplements as recommended by your health care provider. What happens during an annual well check? The services and screenings done by your health care provider during your annual well check will depend on your age, overall health, lifestyle risk factors, and family history of disease. Counseling  Your health care provider may ask you questions about your:  Alcohol use.  Tobacco use.  Drug use.  Emotional well-being.  Home and relationship well-being.  Sexual activity.  Eating habits.  History of falls.  Memory and ability to understand (cognition).  Work and work Statistician.  Reproductive health. Screening  You may have the following tests or measurements:  Height, weight, and BMI.  Blood pressure.  Lipid and cholesterol levels. These may be checked every 5 years, or more frequently if you are over 78 years old.  Skin check.  Lung cancer screening. You may have this screening every year starting at age 34 if you have a 30-pack-year history of smoking and currently smoke or have quit within the past 15 years.  Fecal occult blood test (FOBT) of the stool. You may have this test every year starting at age 41.  Flexible sigmoidoscopy or colonoscopy. You may have a sigmoidoscopy every 5 years or a colonoscopy every 10 years starting  at age 75.  Hepatitis C blood test.  Hepatitis B blood test.  Sexually transmitted disease (STD) testing.  Diabetes screening. This is done by checking your blood sugar (glucose) after you have not eaten for a while (fasting). You may have this done every 1-3 years.  Bone density scan. This is done to screen for osteoporosis. You may have this  done starting at age 45.  Mammogram. This may be done every 1-2 years. Talk to your health care provider about how often you should have regular mammograms. Talk with your health care provider about your test results, treatment options, and if necessary, the need for more tests. Vaccines  Your health care provider may recommend certain vaccines, such as:  Influenza vaccine. This is recommended every year.  Tetanus, diphtheria, and acellular pertussis (Tdap, Td) vaccine. You may need a Td booster every 10 years.  Zoster vaccine. You may need this after age 31.  Pneumococcal 13-valent conjugate (PCV13) vaccine. One dose is recommended after age 16.  Pneumococcal polysaccharide (PPSV23) vaccine. One dose is recommended after age 30. Talk to your health care provider about which screenings and vaccines you need and how often you need them. This information is not intended to replace advice given to you by your health care provider. Make sure you discuss any questions you have with your health care provider. Document Released: 11/06/2015 Document Revised: 06/29/2016 Document Reviewed: 08/11/2015 Elsevier Interactive Patient Education  2017 [place] Prevention in the Home Falls can cause injuries. They can happen to people of all ages. There are many things you can do to make your home safe and to help prevent falls. What can I do on the outside of my home?  Regularly fix the edges of walkways and driveways and fix any cracks.  Remove anything that might make you trip as you walk through a door, such as a raised step or threshold.  Trim any bushes or trees on the path to your home.  Use bright outdoor lighting.  Clear any walking paths of anything that might make someone trip, such as rocks or tools.  Regularly check to see if handrails are loose or broken. Make sure that both sides of any steps have handrails.  Any raised decks and porches should have guardrails on the  edges.  Have any leaves, snow, or ice cleared regularly.  Use sand or salt on walking paths during winter.  Clean up any spills in your garage right away. This includes oil or grease spills. What can I do in the bathroom?  Use night lights.  Install grab bars by the toilet and in the tub and shower. Do not use towel bars as grab bars.  Use non-skid mats or decals in the tub or shower.  If you need to sit down in the shower, use a plastic, non-slip stool.  Keep the floor dry. Clean up any water that spills on the floor as soon as it happens.  Remove soap buildup in the tub or shower regularly.  Attach bath mats securely with double-sided non-slip rug tape.  Do not have throw rugs and other things on the floor that can make you trip. What can I do in the bedroom?  Use night lights.  Make sure that you have a light by your bed that is easy to reach.  Do not use any sheets or blankets that are too big for your bed. They should not hang down onto the floor.  Have a firm  chair that has side arms. You can use this for support while you get dressed.  Do not have throw rugs and other things on the floor that can make you trip. What can I do in the kitchen?  Clean up any spills right away.  Avoid walking on wet floors.  Keep items that you use a lot in easy-to-reach places.  If you need to reach something above you, use a strong step stool that has a grab bar.  Keep electrical cords out of the way.  Do not use floor polish or wax that makes floors slippery. If you must use wax, use non-skid floor wax.  Do not have throw rugs and other things on the floor that can make you trip. What can I do with my stairs?  Do not leave any items on the stairs.  Make sure that there are handrails on both sides of the stairs and use them. Fix handrails that are broken or loose. Make sure that handrails are as long as the stairways.  Check any carpeting to make sure that it is firmly  attached to the stairs. Fix any carpet that is loose or worn.  Avoid having throw rugs at the top or bottom of the stairs. If you do have throw rugs, attach them to the floor with carpet tape.  Make sure that you have a light switch at the top of the stairs and the bottom of the stairs. If you do not have them, ask someone to add them for you. What else can I do to help prevent falls?  Wear shoes that:  Do not have high heels.  Have rubber bottoms.  Are comfortable and fit you well.  Are closed at the toe. Do not wear sandals.  If you use a stepladder:  Make sure that it is fully opened. Do not climb a closed stepladder.  Make sure that both sides of the stepladder are locked into place.  Ask someone to hold it for you, if possible.  Clearly mark and make sure that you can see:  Any grab bars or handrails.  First and last steps.  Where the edge of each step is.  Use tools that help you move around (mobility aids) if they are needed. These include:  Canes.  Walkers.  Scooters.  Crutches.  Turn on the lights when you go into a dark area. Replace any light bulbs as soon as they burn out.  Set up your furniture so you have a clear path. Avoid moving your furniture around.  If any of your floors are uneven, fix them.  If there are any pets around you, be aware of where they are.  Review your medicines with your doctor. Some medicines can make you feel dizzy. This can increase your chance of falling. Ask your doctor what other things that you can do to help prevent falls. This information is not intended to replace advice given to you by your health care provider. Make sure you discuss any questions you have with your health care provider. Document Released: 08/06/2009 Document Revised: 03/17/2016 Document Reviewed: 11/14/2014 Elsevier Interactive Patient Education  2017 [employer].

## 2020-12-06 ENCOUNTER — Other Ambulatory Visit: Payer: Self-pay | Admitting: Internal Medicine

## 2020-12-06 DIAGNOSIS — Z1211 Encounter for screening for malignant neoplasm of colon: Secondary | ICD-10-CM

## 2020-12-09 ENCOUNTER — Telehealth (INDEPENDENT_AMBULATORY_CARE_PROVIDER_SITE_OTHER): Payer: Self-pay | Admitting: Gastroenterology

## 2020-12-09 ENCOUNTER — Other Ambulatory Visit: Payer: Self-pay

## 2020-12-09 DIAGNOSIS — Z86010 Personal history of colonic polyps: Secondary | ICD-10-CM

## 2020-12-09 MED ORDER — NA SULFATE-K SULFATE-MG SULF 17.5-3.13-1.6 GM/177ML PO SOLN: 1.0000 | Freq: Once | ORAL | 0 refills | Status: AC

## 2020-12-09 NOTE — Progress Notes (Signed)
Patient states that during her last colonoscopy the nurse had a hard time starting her IV.  It was very uncomfortable.  Gastroenterology Pre-Procedure Review  Request Date: Tuesday 01/05/21 Requesting Physician: Dr. Allen Norris  PATIENT REVIEW QUESTIONS: The patient responded to the following health history questions as indicated:    1. Are you having any GI issues? no 2. Do you have a personal history of Polyps? yes (patient states she has a history of colon polyps) 3. Do you have a family history of Colon Cancer or Polyps? no 4. Diabetes Mellitus? no 5. Joint replacements in the past 12 months?no 6. Major health problems in the past 3 months?no 7. Any artificial heart valves, MVP, or defibrillator?no    MEDICATIONS & ALLERGIES:    Patient reports the following regarding taking any anticoagulation/antiplatelet therapy:   Plavix, Coumadin, Eliquis, Xarelto, Lovenox, Pradaxa, Brilinta, or Effient? no Aspirin? no  Patient confirms/reports the following medications:  Current Outpatient Medications  Medication Sig Dispense Refill  . alendronate (FOSAMAX) 70 MG tablet Take 1 tablet (70 mg total) by mouth every 7 (seven) days. Take with a full glass of water on an empty stomach. 12 tablet 3  . amLODipine (NORVASC) 2.5 MG tablet TAKE 1 TABLET AT BEDTIME 90 tablet 2  . Cholecalciferol 100 MCG (4000 UT) CAPS Take by mouth.    . ezetimibe (ZETIA) 10 MG tablet TAKE 1 TABLET BY MOUTH EVERY DAY 90 tablet 1  . Lithium Carbonate POWD     . Na Sulfate-K Sulfate-Mg Sulf 17.5-3.13-1.6 GM/177ML SOLN Take 1 kit by mouth once for 1 dose. 354 mL 0  . triamcinolone (NASACORT) 55 MCG/ACT AERO nasal inhaler Place 2 sprays into the nose as needed.    Marland Kitchen EPINEPHrine (EPIPEN 2-PAK) 0.3 mg/0.3 mL IJ SOAJ injection Inject 0.3 mLs (0.3 mg total) into the muscle as needed for anaphylaxis. (Patient not taking: No sig reported) 1 each 1  . meloxicam (MOBIC) 7.5 MG tablet Take 1 tablet (7.5 mg total) by mouth as needed.  (Patient not taking: Reported on 12/09/2020) 90 tablet 1  . mupirocin ointment (BACTROBAN) 2 % Place 1 application into the nose 2 (two) times daily. As needed (Patient not taking: No sig reported) 22 g 5   No current facility-administered medications for this visit.    Patient confirms/reports the following allergies:  Allergies  Allergen Reactions  . Contrast Media [Iodinated Diagnostic Agents] Swelling  . Iodine Swelling  . Milk-Related Compounds Diarrhea  . Prednisolone Anxiety    Agitation to oral prednisone tapers   . Corn-Containing Products     headaches  . Penicillins     Childhood does not know reaction  . Sulfa Antibiotics Hives    No orders of the defined types were placed in this encounter.   AUTHORIZATION INFORMATION Primary Insurance: 1D#: Group #:  Secondary Insurance: 1D#: Group #:  SCHEDULE INFORMATION: Date: Tuesday 01/05/21 Time: Location:[facility]

## 2020-12-23 ENCOUNTER — Other Ambulatory Visit: Payer: Self-pay | Admitting: Internal Medicine

## 2020-12-29 ENCOUNTER — Other Ambulatory Visit: Payer: Self-pay | Admitting: Internal Medicine

## 2020-12-30 ENCOUNTER — Other Ambulatory Visit: Payer: Self-pay | Admitting: Internal Medicine

## 2020-12-30 ENCOUNTER — Telehealth: Payer: Self-pay | Admitting: Internal Medicine

## 2020-12-30 MED ORDER — ALENDRONATE SODIUM 70 MG PO TABS: 70.0000 mg | ORAL_TABLET | ORAL | 3 refills | Status: DC

## 2020-12-30 NOTE — Telephone Encounter (Signed)
Rx has been resent with correct quantity. 

## 2020-12-30 NOTE — Telephone Encounter (Signed)
[facility] called in about the amount they come 20 instead of 12 wanted to know if it is ok to give her box of 20 [phone] opt 2 ref #[id number]

## 2021-01-01 ENCOUNTER — Other Ambulatory Visit
Admission: RE | Admit: 2021-01-01 | Discharge: 2021-01-01 | Disposition: A | Discharge status: Home / Self Care | Payer: Medicare Other | Source: Ambulatory Visit | Attending: Gastroenterology | Admitting: Gastroenterology

## 2021-01-01 ENCOUNTER — Other Ambulatory Visit: Payer: Self-pay

## 2021-01-01 DIAGNOSIS — Z20822 Contact with and (suspected) exposure to covid-19: Secondary | ICD-10-CM | POA: Diagnosis not present

## 2021-01-01 DIAGNOSIS — Z01812 Encounter for preprocedural laboratory examination: Secondary | ICD-10-CM | POA: Diagnosis not present

## 2021-01-01 LAB — SARS CORONAVIRUS 2 (TAT 6-24 HRS): SARS Coronavirus 2: NEGATIVE

## 2021-01-05 ENCOUNTER — Ambulatory Visit
Admission: RE | Admit: 2021-01-05 | Discharge: 2021-01-05 | Disposition: A | Discharge status: Home / Self Care | Payer: Medicare Other | Attending: Gastroenterology | Admitting: Gastroenterology

## 2021-01-05 ENCOUNTER — Encounter: Payer: Self-pay | Admitting: Gastroenterology

## 2021-01-05 ENCOUNTER — Ambulatory Visit: Payer: Medicare Other | Admitting: Anesthesiology

## 2021-01-05 ENCOUNTER — Encounter
Admission: RE | Disposition: A | Discharge status: Home / Self Care | Payer: Self-pay | Source: Home / Self Care | Attending: Gastroenterology

## 2021-01-05 DIAGNOSIS — D122 Benign neoplasm of ascending colon: Secondary | ICD-10-CM | POA: Insufficient documentation

## 2021-01-05 DIAGNOSIS — Z880 Allergy status to penicillin: Secondary | ICD-10-CM | POA: Insufficient documentation

## 2021-01-05 DIAGNOSIS — Z91041 Radiographic dye allergy status: Secondary | ICD-10-CM | POA: Insufficient documentation

## 2021-01-05 DIAGNOSIS — Z79899 Other long term (current) drug therapy: Secondary | ICD-10-CM | POA: Insufficient documentation

## 2021-01-05 DIAGNOSIS — D126 Benign neoplasm of colon, unspecified: Secondary | ICD-10-CM

## 2021-01-05 DIAGNOSIS — Z86010 Personal history of colonic polyps: Secondary | ICD-10-CM | POA: Insufficient documentation

## 2021-01-05 DIAGNOSIS — Z791 Long term (current) use of non-steroidal anti-inflammatories (NSAID): Secondary | ICD-10-CM | POA: Insufficient documentation

## 2021-01-05 DIAGNOSIS — Z882 Allergy status to sulfonamides status: Secondary | ICD-10-CM | POA: Insufficient documentation

## 2021-01-05 DIAGNOSIS — K635 Polyp of colon: Secondary | ICD-10-CM

## 2021-01-05 DIAGNOSIS — Z888 Allergy status to other drugs, medicaments and biological substances status: Secondary | ICD-10-CM | POA: Insufficient documentation

## 2021-01-05 DIAGNOSIS — I10 Essential (primary) hypertension: Secondary | ICD-10-CM | POA: Diagnosis not present

## 2021-01-05 DIAGNOSIS — E785 Hyperlipidemia, unspecified: Secondary | ICD-10-CM | POA: Diagnosis not present

## 2021-01-05 DIAGNOSIS — Z1211 Encounter for screening for malignant neoplasm of colon: Secondary | ICD-10-CM | POA: Diagnosis not present

## 2021-01-05 HISTORY — PX: COLONOSCOPY WITH PROPOFOL: SHX5780

## 2021-01-05 SURGERY — COLONOSCOPY WITH PROPOFOL
Anesthesia: General

## 2021-01-05 MED ORDER — PROPOFOL 500 MG/50ML IV EMUL
INTRAVENOUS | Status: AC
  Filled 2021-01-05: qty 50

## 2021-01-05 MED ORDER — LIDOCAINE HCL (CARDIAC) PF 100 MG/5ML IV SOSY
PREFILLED_SYRINGE | INTRAVENOUS | Status: DC | PRN
  Administered 2021-01-05: 100 mg via INTRAVENOUS

## 2021-01-05 MED ORDER — PROPOFOL 10 MG/ML IV BOLUS
INTRAVENOUS | Status: DC | PRN
  Administered 2021-01-05: 40 mg via INTRAVENOUS
  Administered 2021-01-05: 30 mg via INTRAVENOUS

## 2021-01-05 MED ORDER — GLYCOPYRROLATE 0.2 MG/ML IJ SOLN
INTRAMUSCULAR | Status: AC
  Filled 2021-01-05: qty 3

## 2021-01-05 MED ORDER — PROPOFOL 500 MG/50ML IV EMUL
INTRAVENOUS | Status: DC | PRN
  Administered 2021-01-05: 140 ug/kg/min via INTRAVENOUS

## 2021-01-05 MED ORDER — GLYCOPYRROLATE 0.2 MG/ML IJ SOLN
INTRAMUSCULAR | Status: DC | PRN
  Administered 2021-01-05: .2 mg via INTRAVENOUS

## 2021-01-05 MED ORDER — LIDOCAINE HCL (PF) 2 % IJ SOLN
INTRAMUSCULAR | Status: AC
  Filled 2021-01-05: qty 30

## 2021-01-05 MED ORDER — PROPOFOL 10 MG/ML IV BOLUS
INTRAVENOUS | Status: AC
  Filled 2021-01-05: qty 20

## 2021-01-05 MED ORDER — LACTATED RINGERS IV SOLN: INTRAVENOUS | Status: DC | PRN

## 2021-01-05 MED ORDER — SODIUM CHLORIDE 0.9 % IV SOLN: INTRAVENOUS | Status: DC

## 2021-01-05 NOTE — Anesthesia Postprocedure Evaluation (Signed)
Anesthesia Post Note  Patient: [name]  Procedure(s) Performed: COLONOSCOPY WITH PROPOFOL (N/A )  Patient location during evaluation: Endoscopy Anesthesia Type: General Level of consciousness: awake and alert Pain management: pain level controlled Vital Signs Assessment: post-procedure vital signs reviewed and stable Respiratory status: spontaneous breathing, nonlabored ventilation, respiratory function stable and patient connected to nasal cannula oxygen Cardiovascular status: blood pressure returned to baseline and stable Postop Assessment: no apparent nausea or vomiting Anesthetic complications: no   No complications documented.   Last Vitals:  Vitals:   01/05/21 0936 01/05/21 0946  BP: (!) 133/97 (!) 144/85  Pulse:    Resp:    Temp:    SpO2:  100%    Last Pain:  Vitals:   01/05/21 0946  TempSrc:   PainSc: 0-No pain                 Precious Haws Piscitello

## 2021-01-05 NOTE — Anesthesia Preprocedure Evaluation (Signed)
Anesthesia Evaluation  Patient identified by MRN, date of birth, ID band Patient awake    Reviewed: Allergy & Precautions, H&P , NPO status , Patient's Chart, lab work & pertinent test results  History of Anesthesia Complications (+) PONV and history of anesthetic complications  Airway Mallampati: III  TM Distance: >3 FB Neck ROM: full    Dental  (+) Chipped   Pulmonary neg pulmonary ROS, neg shortness of breath,    Pulmonary exam normal        Cardiovascular Exercise Tolerance: Good hypertension, (-) angina(-) Past MI Normal cardiovascular exam     Neuro/Psych negative neurological ROS  negative psych ROS   GI/Hepatic Neg liver ROS, GERD  Medicated and Controlled,  Endo/Other  negative endocrine ROS  Renal/GU Renal disease  negative genitourinary   Musculoskeletal  (+) Arthritis ,   Abdominal   Peds  Hematology negative hematology ROS (+)   Anesthesia Other Findings Past Medical History: 2010: Adenomatous polyps     Comment:  colon No date: Allergy No date: Arthritis No date: Chicken pox No date: Complication of anesthesia     Comment:  took a while to wake up from lithotripsy No date: GERD (gastroesophageal reflux disease) No date: Hyperlipidemia No date: Hypertension No date: Kidney stone No date: Pneumonia  Past Surgical History: 05/13/2020: CATARACT EXTRACTION W/PHACO; Left     Comment:  Procedure: CATARACT EXTRACTION PHACO AND INTRAOCULAR               LENS PLACEMENT ([place]) LEFT;  Surgeon: [name], MD;  Location: [place];  Service:               Ophthalmology;  Laterality: Left;  6.61 0:53.0 12.4% 06/10/2020: CATARACT EXTRACTION W/PHACO; Right     Comment:  Procedure: CATARACT EXTRACTION PHACO AND INTRAOCULAR               LENS PLACEMENT ([place]) RIGHT VIVITY TORIC LENS;  Surgeon:               [name], MD;  Location: [place];               Service: Ophthalmology;  Laterality: Right;                9.04 1:12.8 12.4% 2017: COLONOSCOPY 03/28/2019: EXTRACORPOREAL SHOCK WAVE LITHOTRIPSY; Left     Comment:  Procedure: EXTRACORPOREAL SHOCK WAVE LITHOTRIPSY (ESWL);              Surgeon: [name], MD;  Location: [facility];                Service: Urology;  Laterality: Left; No date: SPINE SURGERY  BMI    Body Mass Index: 22.29 kg/m      Reproductive/Obstetrics negative OB ROS                             Anesthesia Physical Anesthesia Plan  ASA: III  Anesthesia Plan: General   Post-op Pain Management:    Induction: Intravenous  PONV Risk Score and Plan: Propofol infusion and TIVA  Airway Management Planned: Natural Airway and Nasal Cannula  Additional Equipment:   Intra-op Plan:   Post-operative Plan:   Informed Consent: I have reviewed the patients History and Physical, chart, labs and discussed the procedure including the risks, benefits and alternatives for the proposed anesthesia with the  patient or authorized representative who has indicated his/her understanding and acceptance.     Dental Advisory Given  Plan Discussed with: Anesthesiologist, CRNA and Surgeon  Anesthesia Plan Comments: (Patient consented for risks of anesthesia including but not limited to:  - adverse reactions to medications - risk of airway placement if required - damage to eyes, teeth, lips or other oral mucosa - nerve damage due to positioning  - sore throat or hoarseness - Damage to heart, brain, nerves, lungs, other parts of body or loss of life  Patient voiced understanding.)        Anesthesia Quick Evaluation

## 2021-01-05 NOTE — Anesthesia Procedure Notes (Signed)
Procedure Name: MAC Date/Time: 01/05/2021 9:04 AM Performed by: Lily Peer, Lovett Coffin, CRNA Pre-anesthesia Checklist: Patient identified, Emergency Drugs available, Suction available, Patient being monitored and Timeout performed Patient Re-evaluated:Patient Re-evaluated prior to induction Oxygen Delivery Method: Nasal cannula Induction Type: IV induction

## 2021-01-05 NOTE — Transfer of Care (Signed)
Immediate Anesthesia Transfer of Care Note  Patient: [name]  Procedure(s) Performed: COLONOSCOPY WITH PROPOFOL (N/A )  Patient Location: Endoscopy Unit  Anesthesia Type:General  Level of Consciousness: awake and drowsy  Airway & Oxygen Therapy: Patient Spontanous Breathing  Post-op Assessment: Report given to RN and Post -op Vital signs reviewed and stable  Post vital signs: Reviewed and stable  Last Vitals:  Vitals Value Taken Time  BP 97/64 01/05/21 0916  Temp    Pulse 75 01/05/21 0916  Resp 15 01/05/21 0916  SpO2 100 % 01/05/21 0916  Vitals shown include unvalidated device data.  Last Pain:  Vitals:   01/05/21 0916  TempSrc:   PainSc: Asleep         Complications: No complications documented.

## 2021-01-05 NOTE — H&P (Signed)
[name], MD [address] [place], [place] [address] [phone] Fax : [phone]  Primary Care Physician:  [name], MD Primary Gastroenterologist:  Dr. Allen Norris  Pre-Procedure History & Physical: HPI:  [name] is a 70 y.o. female is here for an colonoscopy.   Past Medical History:  Diagnosis Date  . Adenomatous polyps 2010   colon  . Allergy   . Arthritis   . Chicken pox   . Complication of anesthesia    took a while to wake up from lithotripsy  . GERD (gastroesophageal reflux disease)   . Hyperlipidemia   . Hypertension   . Kidney stone   . Pneumonia     Past Surgical History:  Procedure Laterality Date  . CATARACT EXTRACTION W/PHACO Left 05/13/2020   Procedure: CATARACT EXTRACTION PHACO AND INTRAOCULAR LENS PLACEMENT ([place]) LEFT;  Surgeon: [name], MD;  Location: [place];  Service: Ophthalmology;  Laterality: Left;  6.61 0:53.0 12.4%  . CATARACT EXTRACTION W/PHACO Right 06/10/2020   Procedure: CATARACT EXTRACTION PHACO AND INTRAOCULAR LENS PLACEMENT ([place]) RIGHT VIVITY TORIC LENS;  Surgeon: [name], MD;  Location: [place];  Service: Ophthalmology;  Laterality: Right;  9.04 1:12.8 12.4%  . COLONOSCOPY  2017  . EXTRACORPOREAL SHOCK WAVE LITHOTRIPSY Left 03/28/2019   Procedure: EXTRACORPOREAL SHOCK WAVE LITHOTRIPSY (ESWL);  Surgeon: [name], MD;  Location: [facility];  Service: Urology;  Laterality: Left;  . SPINE SURGERY      Prior to Admission medications   Medication Sig Start Date End Date Taking? Authorizing Provider  amLODipine (NORVASC) 2.5 MG tablet TAKE 1 TABLET AT BEDTIME 06/04/20  Yes [name], MD  Cholecalciferol 100 MCG (4000 UT) CAPS Take by mouth.   Yes [provider]  ezetimibe (ZETIA) 10 MG tablet TAKE 1 TABLET BY MOUTH EVERY DAY 08/21/20  Yes [name], MD  meloxicam (MOBIC) 7.5 MG tablet Take 1 tablet (7.5 mg total) by mouth as needed.  10/15/19  Yes [name], MD  triamcinolone (NASACORT) 55 MCG/ACT AERO nasal inhaler Place 2 sprays into the nose as needed.   Yes [provider]  alendronate (FOSAMAX) 70 MG tablet Take 1 tablet (70 mg total) by mouth once a week. Take with a full glass of water on an empty stomach. 12/30/20   [name], MD  EPINEPHrine (EPIPEN 2-PAK) 0.3 mg/0.3 mL IJ SOAJ injection Inject 0.3 mLs (0.3 mg total) into the muscle as needed for anaphylaxis. Patient not taking: No sig reported 11/21/19   McLean-Scocuzza, Nino Glow, MD  Lithium Carbonate POWD  01/31/20   [provider]  mupirocin ointment (BACTROBAN) 2 % Place 1 application into the nose 2 (two) times daily. As needed Patient not taking: No sig reported 11/06/19   [name], MD    Allergies as of 12/09/2020 - Review Complete 12/09/2020  Allergen Reaction Noted  . Contrast media [iodinated diagnostic agents] Swelling 06/26/2013  . Iodine Swelling 06/26/2013  . Milk-related compounds Diarrhea 06/26/2013  . Prednisolone Anxiety 11/21/2016  . Corn-containing products  06/26/2013  . Penicillins  06/26/2013  . Sulfa antibiotics Hives 06/26/2013    Family History  Problem Relation Age of Onset  . Cancer Mother   . Stroke Mother   . Heart disease Mother   . Hypertension Mother   . Diabetes Mother   . Breast cancer Mother 49  . Heart disease Father   . Hypertension Father     Social History  Socioeconomic History  . Marital status: Married    Spouse name: Not on file  . Number of children: Not on file  . Years of education: Not on file  . Highest education level: Not on file  Occupational History  . Not on file  Tobacco Use  . Smoking status: Never Smoker  . Smokeless tobacco: Never Used  Vaping Use  . Vaping Use: Never used  Substance and Sexual Activity  . Alcohol use: Yes    Alcohol/week: 3.0 standard drinks    Types: 3 Glasses of wine per week  . Drug use: No  . Sexual activity: Yes  Other  Topics Concern  . Not on file  Social History Narrative  . Not on file   Social Determinants of Health   Financial Resource Strain: Low Risk   . Difficulty of Paying Living Expenses: Not hard at all  Food Insecurity: No Food Insecurity  . Worried About Charity fundraiser in the Last Year: Never true  . Ran Out of Food in the Last Year: Never true  Transportation Needs: No Transportation Needs  . Lack of Transportation (Medical): No  . Lack of Transportation (Non-Medical): No  Physical Activity: Not on file  Stress: No Stress Concern Present  . Feeling of Stress : Not at all  Social Connections: Unknown  . Frequency of Communication with Friends and Family: Not on file  . Frequency of Social Gatherings with Friends and Family: Not on file  . Attends Religious Services: Not on file  . Active Member of Clubs or Organizations: Not on file  . Attends Archivist Meetings: Not on file  . Marital Status: Married  Human resources officer Violence: Not At Risk  . Fear of Current or Ex-Partner: No  . Emotionally Abused: No  . Physically Abused: No  . Sexually Abused: No    Review of Systems: See HPI, otherwise negative ROS  Physical Exam: BP 122/80   Pulse 78   Temp 98 F (36.7 C) (Tympanic)   Resp 20   Ht 5' 0.75" (1.543 m)   Wt 53.1 kg   SpO2 99%   BMI 22.29 kg/m  General:   Alert,  pleasant and cooperative in NAD Head:  Normocephalic and atraumatic. Neck:  Supple; no masses or thyromegaly. Lungs:  Clear throughout to auscultation.    Heart:  Regular rate and rhythm. Abdomen:  Soft, nontender and nondistended. Normal bowel sounds, without guarding, and without rebound.   Neurologic:  Alert and  oriented x4;  grossly normal neurologically.  Impression/Plan: [name] is here for an colonoscopy to be performed for history of polyps  Risks, benefits, limitations, and alternatives regarding  colonoscopy have been reviewed with the patient.  Questions have been  answered.  All parties agreeable.   [name], MD  01/05/2021, 8:53 AM

## 2021-01-05 NOTE — Op Note (Signed)
[facility] Patient Name: [name] Procedure Date: 01/05/2021 8:56 AM MRN: [id number] Account #: [id number] Date of Birth: 02-13-1951 Admit Type: Outpatient Age: 70 Room: [facility] ENDO ROOM 4 Gender: Female Note Status: Finalized Procedure:             Colonoscopy Indications:           High risk colon cancer surveillance: Personal history                         of colonic polyps Providers:             [name] MD, MD Referring MD:          Deborra Medina, MD (Referring MD) Medicines:             Propofol per Anesthesia Complications:         No immediate complications. Procedure:             Pre-Anesthesia Assessment:                        - Prior to the procedure, a History and Physical was                         performed, and patient medications and allergies were                         reviewed. The patient's tolerance of previous                         anesthesia was also reviewed. The risks and benefits                         of the procedure and the sedation options and risks                         were discussed with the patient. All questions were                         answered, and informed consent was obtained. Prior                         Anticoagulants: The patient has taken no previous                         anticoagulant or antiplatelet agents. ASA Grade                         Assessment: II - A patient with mild systemic disease.                         After reviewing the risks and benefits, the patient                         was deemed in satisfactory condition to undergo the                         procedure.  After obtaining informed consent, the colonoscope was                         passed under direct vision. Throughout the procedure,                         the patient's blood pressure, pulse, and oxygen                         saturations were monitored continuously. The                          Colonoscope was introduced through the anus and                         advanced to the the cecum, identified by appendiceal                         orifice and ileocecal valve. The colonoscopy was                         performed without difficulty. The patient tolerated                         the procedure well. The quality of the bowel                         preparation was excellent. Findings:      The perianal and digital rectal examinations were normal.      A 3 mm polyp was found in the ascending colon. The polyp was sessile.       The polyp was removed with a cold snare. Resection and retrieval were       complete.      A 3 mm polyp was found in the sigmoid colon. The polyp was sessile. The       polyp was removed with a cold snare. Resection and retrieval were       complete. Impression:            - One 3 mm polyp in the ascending colon, removed with                         a cold snare. Resected and retrieved.                        - One 3 mm polyp in the sigmoid colon, removed with a                         cold snare. Resected and retrieved. Recommendation:        - Discharge patient to home.                        - Resume previous diet.                        - Repeat colonoscopy in 5 years for surveillance. Procedure Code(s):     --- Professional ---  45385, Colonoscopy, flexible; with removal of                         tumor(s), polyp(s), or other lesion(s) by snare                         technique Diagnosis Code(s):     --- Professional ---                        Z86.010, Personal history of colonic polyps                        K63.5, Polyp of colon CPT copyright 2019 [employer]. All rights reserved. The codes documented in this report are preliminary and upon coder review may  be revised to meet current compliance requirements. [name] MD, MD 01/05/2021 9:15:37 AM This report has been signed  electronically. Number of Addenda: 0 Note Initiated On: 01/05/2021 8:56 AM Scope Withdrawal Time: 0 hours 7 minutes 13 seconds  Total Procedure Duration: 0 hours 10 minutes 21 seconds  Estimated Blood Loss:  Estimated blood loss: none.      [facility]

## 2021-01-06 ENCOUNTER — Encounter: Payer: Self-pay | Admitting: Internal Medicine

## 2021-01-06 ENCOUNTER — Encounter: Payer: Self-pay | Admitting: Gastroenterology

## 2021-01-06 LAB — SURGICAL PATHOLOGY

## 2021-01-07 ENCOUNTER — Encounter: Payer: Self-pay | Admitting: Gastroenterology

## 2021-01-18 DIAGNOSIS — H43813 Vitreous degeneration, bilateral: Secondary | ICD-10-CM | POA: Diagnosis not present

## 2021-01-19 DIAGNOSIS — Z23 Encounter for immunization: Secondary | ICD-10-CM | POA: Diagnosis not present

## 2021-01-27 ENCOUNTER — Other Ambulatory Visit: Payer: Self-pay | Admitting: Internal Medicine

## 2021-01-27 DIAGNOSIS — Z1231 Encounter for screening mammogram for malignant neoplasm of breast: Secondary | ICD-10-CM

## 2021-01-27 DIAGNOSIS — U071 COVID-19: Secondary | ICD-10-CM | POA: Diagnosis not present

## 2021-02-16 ENCOUNTER — Other Ambulatory Visit: Payer: Self-pay

## 2021-02-16 ENCOUNTER — Ambulatory Visit
Admission: RE | Admit: 2021-02-16 | Discharge: 2021-02-16 | Disposition: A | Discharge status: Home / Self Care | Payer: Medicare Other | Source: Ambulatory Visit | Attending: Urology | Admitting: Urology

## 2021-02-16 ENCOUNTER — Ambulatory Visit
Admission: RE | Admit: 2021-02-16 | Discharge: 2021-02-16 | Disposition: A | Discharge status: Home / Self Care | Payer: Medicare Other | Attending: Internal Medicine | Admitting: Internal Medicine

## 2021-02-16 DIAGNOSIS — N200 Calculus of kidney: Secondary | ICD-10-CM | POA: Insufficient documentation

## 2021-02-17 ENCOUNTER — Ambulatory Visit: Payer: Medicare Other | Admitting: Urology

## 2021-02-26 ENCOUNTER — Other Ambulatory Visit: Payer: Self-pay | Admitting: Internal Medicine

## 2021-03-08 ENCOUNTER — Other Ambulatory Visit: Payer: Self-pay

## 2021-03-08 ENCOUNTER — Ambulatory Visit
Admission: RE | Admit: 2021-03-08 | Discharge: 2021-03-08 | Disposition: A | Discharge status: Home / Self Care | Payer: Medicare Other | Source: Ambulatory Visit | Attending: Internal Medicine | Admitting: Internal Medicine

## 2021-03-08 DIAGNOSIS — Z1231 Encounter for screening mammogram for malignant neoplasm of breast: Secondary | ICD-10-CM | POA: Insufficient documentation

## 2021-03-08 DIAGNOSIS — U071 COVID-19: Secondary | ICD-10-CM | POA: Diagnosis not present

## 2021-03-10 ENCOUNTER — Ambulatory Visit (INDEPENDENT_AMBULATORY_CARE_PROVIDER_SITE_OTHER): Payer: Medicare Other | Admitting: Urology

## 2021-03-10 ENCOUNTER — Other Ambulatory Visit: Payer: Self-pay

## 2021-03-10 ENCOUNTER — Encounter: Payer: Self-pay | Admitting: Urology

## 2021-03-10 VITALS — BP 131/76 | HR 76 | Ht 60.75 in | Wt 119.0 lb

## 2021-03-10 DIAGNOSIS — N3281 Overactive bladder: Secondary | ICD-10-CM | POA: Diagnosis not present

## 2021-03-10 DIAGNOSIS — N200 Calculus of kidney: Secondary | ICD-10-CM | POA: Diagnosis not present

## 2021-03-10 MED ORDER — GEMTESA 75 MG PO TABS: 75.0000 mg | ORAL_TABLET | Freq: Every day | ORAL | 0 refills | Status: DC

## 2021-03-10 NOTE — Progress Notes (Signed)
03/10/2021 2:30 PM   [name] [birth year] [id number]  Referring provider: [name], MD [place],  [place] [address]  Chief Complaint  Patient presents with  . Nephrolithiasis    Urologic history: 1.Bilateral nephrolithiasis -Shockwave lithotripsy 03/2019 right distal ureteral calculus -Bilateral nonobstructing renal calculi -Stone analysis 18%[id number] -Metabolic evaluation low urine volume 1.43 L and hypocitraturia  2.  Overactive bladder   HPI: 70 y.o. female presents for semiannual follow-up.   No flank/abdominal pain since last visit  Was given a trial of Myrbetriq 25 mg for OAB symptoms which improved her voiding symptoms however she did note elevated blood pressure  Inquiring if any alternative medications that do not affect blood pressure can be tried  No dysuria or gross hematuria   PMH: Past Medical History:  Diagnosis Date  . Adenomatous polyps 2010   colon  . Allergy   . Arthritis   . Chicken pox   . Complication of anesthesia    took a while to wake up from lithotripsy  . GERD (gastroesophageal reflux disease)   . Hyperlipidemia   . Hypertension   . Kidney stone   . Pneumonia     Surgical History: Past Surgical History:  Procedure Laterality Date  . CATARACT EXTRACTION W/PHACO Left 05/13/2020   Procedure: CATARACT EXTRACTION PHACO AND INTRAOCULAR LENS PLACEMENT ([place]) LEFT;  Surgeon: [name], MD;  Location: [place];  Service: Ophthalmology;  Laterality: Left;  6.61 0:53.0 12.4%  . CATARACT EXTRACTION W/PHACO Right 06/10/2020   Procedure: CATARACT EXTRACTION PHACO AND INTRAOCULAR LENS PLACEMENT ([place]) RIGHT VIVITY TORIC LENS;  Surgeon: [name], MD;  Location: [place];  Service: Ophthalmology;  Laterality: Right;  9.04 1:12.8 12.4%  . COLONOSCOPY  2017  . COLONOSCOPY WITH PROPOFOL N/A 01/05/2021   Procedure: COLONOSCOPY WITH PROPOFOL;  Surgeon: [name],  MD;  Location: [facility] ENDOSCOPY;  Service: Endoscopy;  Laterality: N/A;  . EXTRACORPOREAL SHOCK WAVE LITHOTRIPSY Left 03/28/2019   Procedure: EXTRACORPOREAL SHOCK WAVE LITHOTRIPSY (ESWL);  Surgeon: [name], MD;  Location: [facility];  Service: Urology;  Laterality: Left;  . SPINE SURGERY      Home Medications:  Allergies as of 03/10/2021      Reactions   Contrast Media [iodinated Diagnostic Agents] Swelling   Iodine Swelling   Milk-related Compounds Diarrhea   Prednisolone Anxiety   Agitation to oral prednisone tapers    Corn-containing Products    headaches   Penicillins    Childhood does not know reaction   Sulfa Antibiotics Hives      Medication List       Accurate as of Mar 10, 2021  2:30 PM. If you have any questions, ask your nurse or doctor.        alendronate 70 MG tablet Commonly known as: FOSAMAX Take 1 tablet (70 mg total) by mouth once a week. Take with a full glass of water on an empty stomach.   amLODipine 2.5 MG tablet Commonly known as: NORVASC TAKE 1 TABLET AT BEDTIME   Cholecalciferol 100 MCG (4000 UT) Caps Take by mouth.   EPINEPHrine 0.3 mg/0.3 mL Soaj injection Commonly known as: EpiPen 2-Pak Inject 0.3 mLs (0.3 mg total) into the muscle as needed for anaphylaxis.   ezetimibe 10 MG tablet Commonly known as: ZETIA TAKE 1 TABLET BY MOUTH EVERY DAY   Lithium Carbonate Powd   meloxicam 7.5 MG tablet Commonly known as: MOBIC Take 1 tablet (7.5 mg total) by mouth as  needed.   mupirocin ointment 2 % Commonly known as: BACTROBAN Place 1 application into the nose 2 (two) times daily. As needed   triamcinolone 55 MCG/ACT Aero nasal inhaler Commonly known as: NASACORT Place 2 sprays into the nose as needed.       Allergies:  Allergies  Allergen Reactions  . Contrast Media [Iodinated Diagnostic Agents] Swelling  . Iodine Swelling  . Milk-Related Compounds Diarrhea  . Prednisolone Anxiety    Agitation to oral prednisone tapers   .  Corn-Containing Products     headaches  . Penicillins     Childhood does not know reaction  . Sulfa Antibiotics Hives    Family History: Family History  Problem Relation Age of Onset  . Cancer Mother   . Stroke Mother   . Heart disease Mother   . Hypertension Mother   . Diabetes Mother   . Breast cancer Mother 37  . Heart disease Father   . Hypertension Father     Social History:  reports that she has never smoked. She has never used smokeless tobacco. She reports current alcohol use of about 3.0 standard drinks of alcohol per week. She reports that she does not use drugs.   Physical Exam: BP 131/76   Pulse 76   Ht 5' 0.75" (1.543 m)   Wt 119 lb (54 kg)   BMI 22.67 kg/m   Constitutional:  Alert and oriented, No acute distress. HEENT: [place] AT, moist mucus membranes.  Trachea midline, no masses. Cardiovascular: No clubbing, cyanosis, or edema. Respiratory: Normal respiratory effort, no increased work of breathing.   Pertinent Imaging: Images personally reviewed and interpreted and compared with prior KUB October 2021.  Stable size and no new calcifications identified  Abdomen 1 view (KUB)  Narrative CLINICAL DATA:  Nephrolithiasis  EXAM: ABDOMEN - 1 VIEW  COMPARISON:  August 19, 2020  FINDINGS: There is a 2 mm calculus in the mid right kidney, stable. On the left, there are stable calculi in the lower pole region. Largest calculus measures 6 x 5 mm. There is an adjacent 1 mm calculus as well as a second adjacent 2 mm calculus in this area, stable. There is a 3 mm probable phlebolith in the mid right pelvis. No new calcifications are evident.  Moderate stool noted in colon. No bowel dilatation or air-fluid level to suggest bowel obstruction. No free air. Postoperative change lower lumbar spine on the left.  IMPRESSION: Calculi in each kidney, more numerous and larger on the left than on the right. Probable small phlebolith right pelvis. No  new calcifications evident. No bowel obstruction or free air. Moderate stool in colon.   Electronically Signed By: Lowella Grip III M.D. On: 02/17/2021 13:25   Assessment & Plan:    1.  Bilateral nephrolithiasis  Stable  Asymptomatic  2.  Overactive bladder  Trial Gemtesa 75 mg daily-we were out of samples and will contact her when received  Will call back regarding efficacy  1 year follow-up with [place], MD  [place] [address] [place], [place] [address] [phone]

## 2021-03-16 DIAGNOSIS — Z20822 Contact with and (suspected) exposure to covid-19: Secondary | ICD-10-CM | POA: Diagnosis not present

## 2021-03-16 DIAGNOSIS — Z03818 Encounter for observation for suspected exposure to other biological agents ruled out: Secondary | ICD-10-CM | POA: Diagnosis not present

## 2021-03-18 ENCOUNTER — Other Ambulatory Visit: Payer: Self-pay

## 2021-03-18 ENCOUNTER — Encounter: Payer: Self-pay | Admitting: Internal Medicine

## 2021-03-18 ENCOUNTER — Ambulatory Visit (INDEPENDENT_AMBULATORY_CARE_PROVIDER_SITE_OTHER): Payer: Medicare Other | Admitting: Internal Medicine

## 2021-03-18 VITALS — BP 132/80 | HR 67 | Temp 98.2°F | Resp 14 | Ht 60.75 in | Wt 121.4 lb

## 2021-03-18 DIAGNOSIS — E559 Vitamin D deficiency, unspecified: Secondary | ICD-10-CM | POA: Diagnosis not present

## 2021-03-18 DIAGNOSIS — Z803 Family history of malignant neoplasm of breast: Secondary | ICD-10-CM

## 2021-03-18 DIAGNOSIS — N200 Calculus of kidney: Secondary | ICD-10-CM | POA: Diagnosis not present

## 2021-03-18 DIAGNOSIS — Z23 Encounter for immunization: Secondary | ICD-10-CM

## 2021-03-18 DIAGNOSIS — D126 Benign neoplasm of colon, unspecified: Secondary | ICD-10-CM

## 2021-03-18 DIAGNOSIS — E782 Mixed hyperlipidemia: Secondary | ICD-10-CM | POA: Diagnosis not present

## 2021-03-18 DIAGNOSIS — I10 Essential (primary) hypertension: Secondary | ICD-10-CM | POA: Diagnosis not present

## 2021-03-18 DIAGNOSIS — R5383 Other fatigue: Secondary | ICD-10-CM | POA: Diagnosis not present

## 2021-03-18 DIAGNOSIS — M8580 Other specified disorders of bone density and structure, unspecified site: Secondary | ICD-10-CM

## 2021-03-18 DIAGNOSIS — E785 Hyperlipidemia, unspecified: Secondary | ICD-10-CM

## 2021-03-18 DIAGNOSIS — Z780 Asymptomatic menopausal state: Secondary | ICD-10-CM

## 2021-03-18 LAB — COMPREHENSIVE METABOLIC PANEL
ALT: 17 U/L (ref 0–35)
AST: 21 U/L (ref 0–37)
Albumin: 4.5 g/dL (ref 3.5–5.2)
Alkaline Phosphatase: 52 U/L (ref 39–117)
BUN: 20 mg/dL (ref 6–23)
CO2: 28 mEq/L (ref 19–32)
Calcium: 9.4 mg/dL (ref 8.4–10.5)
Chloride: 105 mEq/L (ref 96–112)
Creatinine, Ser: 0.73 mg/dL (ref 0.40–1.20)
GFR: 82.52 mL/min (ref >60.00)
Glucose, Bld: 84 mg/dL (ref 70–99)
Potassium: 4.3 mEq/L (ref 3.5–5.1)
Sodium: 141 mEq/L (ref 135–145)
Total Bilirubin: 0.6 mg/dL (ref 0.2–1.2)
Total Protein: 6.7 g/dL (ref 6.0–8.3)

## 2021-03-18 LAB — LIPID PANEL
Cholesterol: 205 mg/dL — ABNORMAL HIGH (ref 0–200)
HDL: 66.8 mg/dL (ref >39.00)
LDL Cholesterol: 126 mg/dL — ABNORMAL HIGH (ref 0–99)
NonHDL: 137.82
Total CHOL/HDL Ratio: 3
Triglycerides: 59 mg/dL (ref 0.0–149.0)
VLDL: 11.8 mg/dL (ref 0.0–40.0)

## 2021-03-18 LAB — CBC WITH DIFFERENTIAL/PLATELET
Basophils Absolute: 0 10*3/uL (ref 0.0–0.1)
Basophils Relative: 0.9 % (ref 0.0–3.0)
Eosinophils Absolute: 0 10*3/uL (ref 0.0–0.7)
Eosinophils Relative: 1.2 % (ref 0.0–5.0)
HCT: 42.6 % (ref 36.0–46.0)
Hemoglobin: 14.4 g/dL (ref 12.0–15.0)
Lymphocytes Relative: 22.3 % (ref 12.0–46.0)
Lymphs Abs: 0.8 10*3/uL (ref 0.7–4.0)
MCHC: 33.8 g/dL (ref 30.0–36.0)
MCV: 93 fl (ref 78.0–100.0)
Monocytes Absolute: 0.4 10*3/uL (ref 0.1–1.0)
Monocytes Relative: 9.8 % (ref 3.0–12.0)
Neutro Abs: 2.5 10*3/uL (ref 1.4–7.7)
Neutrophils Relative %: 65.8 % (ref 43.0–77.0)
Platelets: 167 10*3/uL (ref 150.0–400.0)
RBC: 4.58 Mil/uL (ref 3.87–5.11)
RDW: 12.8 % (ref 11.5–15.5)
WBC: 3.8 10*3/uL — ABNORMAL LOW (ref 4.0–10.5)

## 2021-03-18 LAB — TSH: TSH: 2.3 u[IU]/mL (ref 0.35–4.50)

## 2021-03-18 NOTE — Assessment & Plan Note (Addendum)
Has been taking weekly alendronate since 2009 (started by [facility]) with periods of suspension ,  Last DEeXA was in 2019 and T scores were -2.0   Will stop and repeat DEXA.  There is a family history of clotting disorder so Evista is contraindicated

## 2021-03-18 NOTE — Patient Instructions (Addendum)
Your bone density test has been ordered.  You are welcome to call [facility] to make your appointment [phone]  I agree with stopping your alendronate .  We will repeat your DEXA after one year suspension to look for changes    Health Maintenance After Age 72 After age 68, you are at a higher risk for certain long-term diseases and infections as well as injuries from falls. Falls are a major cause of broken bones and head injuries in people who are older than age 30. Getting regular preventive care can help to keep you healthy and well. Preventive care includes getting regular testing and making lifestyle changes as recommended by your health care provider. Talk with your health care provider about:  Which screenings and tests you should have. A screening is a test that checks for a disease when you have no symptoms.  A diet and exercise plan that is right for you. What should I know about screenings and tests to prevent falls? Screening and testing are the best ways to find a health problem early. Early diagnosis and treatment give you the best chance of managing medical conditions that are common after age 78. Certain conditions and lifestyle choices may make you more likely to have a fall. Your health care provider may recommend:  Regular vision checks. Poor vision and conditions such as cataracts can make you more likely to have a fall. If you wear glasses, make sure to get your prescription updated if your vision changes.  Medicine review. Work with your health care provider to regularly review all of the medicines you are taking, including over-the-counter medicines. Ask your health care provider about any side effects that may make you more likely to have a fall. Tell your health care provider if any medicines that you take make you feel dizzy or sleepy.  Osteoporosis screening. Osteoporosis is a condition that causes the bones to get weaker. This can make the bones weak and cause them to  break more easily.  Blood pressure screening. Blood pressure changes and medicines to control blood pressure can make you feel dizzy.  Strength and balance checks. Your health care provider may recommend certain tests to check your strength and balance while standing, walking, or changing positions.  Foot health exam. Foot pain and numbness, as well as not wearing proper footwear, can make you more likely to have a fall.  Depression screening. You may be more likely to have a fall if you have a fear of falling, feel emotionally low, or feel unable to do activities that you used to do.  Alcohol use screening. Using too much alcohol can affect your balance and may make you more likely to have a fall. What actions can I take to lower my risk of falls? General instructions  Talk with your health care provider about your risks for falling. Tell your health care provider if: ? You fall. Be sure to tell your health care provider about all falls, even ones that seem minor. ? You feel dizzy, sleepy, or off-balance.  Take over-the-counter and prescription medicines only as told by your health care provider. These include any supplements.  Eat a healthy diet and maintain a healthy weight. A healthy diet includes low-fat dairy products, low-fat (lean) meats, and fiber from whole grains, beans, and lots of fruits and vegetables. Home safety  Remove any tripping hazards, such as rugs, cords, and clutter.  Install safety equipment such as grab bars in bathrooms and safety rails on  stairs.  Keep rooms and walkways well-lit. Activity  Follow a regular exercise program to stay fit. This will help you maintain your balance. Ask your health care provider what types of exercise are appropriate for you.  If you need a cane or walker, use it as recommended by your health care provider.  Wear supportive shoes that have nonskid soles.   Lifestyle  Do not drink alcohol if your health care provider tells  you not to drink.  If you drink alcohol, limit how much you have: ? 0-1 drink a day for women. ? 0-2 drinks a day for men.  Be aware of how much alcohol is in your drink. In the [place] one drink equals one typical bottle of beer (12 oz), one-half glass of wine (5 oz), or one shot of hard liquor (1 oz).  Do not use any products that contain nicotine or tobacco, such as cigarettes and e-cigarettes. If you need help quitting, ask your health care provider. Summary  Having a healthy lifestyle and getting preventive care can help to protect your health and wellness after age 23.  Screening and testing are the best way to find a health problem early and help you avoid having a fall. Early diagnosis and treatment give you the best chance for managing medical conditions that are more common for people who are older than age 83.  Falls are a major cause of broken bones and head injuries in people who are older than age 94. Take precautions to prevent a fall at home.  Work with your health care provider to learn what changes you can make to improve your health and wellness and to prevent falls. This information is not intended to replace advice given to you by your health care provider. Make sure you discuss any questions you have with your health care provider. Document Revised: 01/31/2019 Document Reviewed: 08/23/2017 Elsevier Patient Education  2021 [employer].

## 2021-03-18 NOTE — Progress Notes (Signed)
Patient ID: [name], female    DOB: 07-31-1951  Age: 70 y.o. MRN: [id number]  The patient is here for FOLLOW UP AND management of other chronic and acute problems.   The risk factors are reflected in the social history.  The roster of all physicians providing medical care to patient - is listed in the Snapshot section of the chart.  Activities of daily living:  The patient is 100% independent in all ADLs: dressing, toileting, feeding as well as independent mobility  Home safety : The patient has smoke detectors in the home. They wear seatbelts.  There are no firearms at home. There is no violence in the home.   There is no risks for hepatitis, STDs or HIV. There is no   history of blood transfusion. They have no travel history to infectious disease endemic areas of the world.  The patient has seen their dentist in the last six month. They have seen their eye doctor in the last year. They admit to slight hearing difficulty with regard to whispered voices and some television programs.  They have deferred audiologic testing in the last year.  They do not  have excessive sun exposure. Discussed the need for sun protection: hats, long sleeves and use of sunscreen if there is significant sun exposure.   Diet: the importance of a healthy diet is discussed. She has a very healthy  Diet, has restricted her animal protein intake   The benefits of regular aerobic exercise were discussed. She remains physically active 5 days per week with walking, hiking and weights   Depression screen: there are no signs or vegative symptoms of depression- irritability, change in appetite, anhedonia, sadness/tearfullness.  Cognitive assessment: the patient manages all their financial and personal affairs and is actively engaged. They could relate day,date,year and events; recalled 2/3 objects at 3 minutes; performed clock-face test normally.  The following portions of the patient's history were reviewed and updated  as appropriate: allergies, current medications, past family history, past medical history,  past surgical history, past social history  and problem list.  Visual acuity was not assessed per patient preference since she has regular follow up with her ophthalmologist. Hearing and body mass index were assessed and reviewed.   During the course of the visit the patient was educated and counseled about appropriate screening and preventive services including : fall prevention , diabetes screening, nutrition counseling, colorectal cancer screening, and recommended immunizations.    CC: The primary encounter diagnosis was COVID-19 vaccine administered. Diagnoses of Osteopenia after menopause, Essential hypertension, benign, Vitamin D deficiency, Moderate mixed hyperlipidemia not requiring statin therapy, Mixed hyperlipidemia, Fatigue, unspecified type, Bilateral nephrolithiasis, Hyperlipidemia with target LDL less than 130, Tubular adenoma of colon, and Family history of breast cancer were also pertinent to this visit. 1) osteopenia:  Taking alendronate , has been taking it since 2012 or earlier., for an undetermined total length due to a temporary suspension , requesting a holiday  Patient offers additional family history recently discovered through efforts.   Thyroid cancer in a half niece Dec 2021 and breast cancer.   2) recurrent tick bites  This summer.  History of ehrlichiosis several years ago .    No fevers headaches,  No  weird rashes  3)  Seasonal allergies have been "of the charts"  With sinus drainage and congestion.  Has been using steroid nasal spray;  staying inside has helped.  Has   4) shooting pains in lower back early in the morning  that resolve with activity.  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  Entire family tests before any family gathering  7) OAB: waiting for samples from [facility] [place] (vibegron).  Previous trial of myrbetriq worked well at the 25 mg dose  but caused elevated blood pressure  8) history of nephrolithiasis :  Using the  Lithium carbonate powder three times daily with no recurrence of stones .  Has deferred surgery for 2 non passable stones.  Last imaging in April no new calcifications .  Has modified diet , increased citric acid.    9) Hyperlipidemia: has reduced animal protein intake including shellfish to manage her low cholesterol    History Allex has a past medical history of Adenomatous polyps (2010), Allergy, Arthritis, Chicken pox, Complication of anesthesia, GERD (gastroesophageal reflux disease), Hyperlipidemia, Hypertension, Kidney stone, and Pneumonia.   She has a past surgical history that includes Spine surgery; Extracorporeal shock wave lithotripsy (Left, 03/28/2019); Colonoscopy (2017); Cataract extraction w/PHACO (Left, 05/13/2020); Cataract extraction w/PHACO (Right, 06/10/2020); and Colonoscopy with propofol (N/A, 01/05/2021).   Her family history includes Breast cancer (age of onset: 4) in her mother; Cancer in her mother; Diabetes in her mother; Heart disease in her father and mother; Hypertension in her father and mother; Stroke in her mother.She reports that she has never smoked. She has never used smokeless tobacco. She reports current alcohol use of about 3.0 standard drinks of alcohol per week. She reports that she does not use drugs.  Outpatient Medications Prior to Visit  Medication Sig Dispense Refill  . alendronate (FOSAMAX) 70 MG tablet Take 1 tablet (70 mg total) by mouth once a week. Take with a full glass of water on an empty stomach. 20 tablet 3  . amLODipine (NORVASC) 2.5 MG tablet TAKE 1 TABLET AT BEDTIME 90 tablet 2  .  Cholecalciferol 100 MCG (4000 UT) CAPS Take by mouth.    . EPINEPHrine (EPIPEN 2-PAK) 0.3 mg/0.3 mL IJ SOAJ injection Inject 0.3 mLs (0.3 mg total) into the muscle as needed for anaphylaxis. 1 each 1  . ezetimibe (ZETIA) 10 MG tablet TAKE 1 TABLET BY MOUTH EVERY DAY 90 tablet 1  . Lithium Carbonate POWD     . meloxicam (MOBIC) 7.5 MG tablet Take 1 tablet (7.5 mg total) by mouth as needed. 90 tablet 1  . mupirocin ointment (BACTROBAN) 2 % Place 1 application into the nose 2 (two) times daily. As needed 22 g 5  . triamcinolone (NASACORT) 55 MCG/ACT AERO nasal inhaler Place 2 sprays into the nose as needed.    . Vibegron (GEMTESA) 75 MG TABS Take 75 mg by mouth daily. (Patient not taking: Reported on 03/18/2021) 30 tablet 0   No facility-administered medications prior to visit.    Review of Systems   Patient denies headache, fevers, malaise, unintentional weight loss, skin rash, eye pain, sinus congestion and sinus pain, sore throat, dysphagia,  hemoptysis , cough, dyspnea, wheezing, chest pain, palpitations, orthopnea, edema, abdominal pain, nausea, melena, diarrhea, constipation, flank pain, dysuria, hematuria, urinary  Frequency, nocturia, numbness, tingling, seizures,  Focal weakness, Loss of consciousness,  Tremor, insomnia, depression, anxiety, and suicidal ideation.      Objective:  BP  132/80 (BP Location: Left Arm, Patient Position: Sitting, Cuff Size: Normal)   Pulse 67   Temp 98.2 F (36.8 C) (Oral)   Resp 14   Ht 5' 0.75" (1.543 m)   Wt 121 lb 6.4 oz (55.1 kg)   SpO2 98%   BMI 23.13 kg/m   Physical Exam  .General appearance: alert, cooperative and appears stated age Ears: normal TM's and external ear canals both ears Throat: lips, mucosa, and tongue normal; teeth and gums normal Neck: no adenopathy, no carotid bruit, supple, symmetrical, trachea midline and thyroid not enlarged, symmetric, no tenderness/mass/nodules Back: symmetric, no curvature. ROM normal. No CVA  tenderness. Lungs: clear to auscultation bilaterally Heart: regular rate and rhythm, S1, S2 normal, no murmur, click, rub or gallop Abdomen: soft, non-tender; bowel sounds normal; no masses,  no organomegaly Pulses: 2+ and symmetric Skin: Skin color, texture, turgor normal. No rashes or lesions Lymph nodes: Cervical, supraclavicular, and axillary nodes normal.   Assessment & Plan:   Problem List Items Addressed This Visit      Unprioritized   Bilateral nephrolithiasis    She has resumed dietary calcium  And has not had a recurrence since starting the lithium carbonate supplement advised by her urologist Dr Stoioff:/  She has 2 nonobstructing stones that have not caused symptoms .  No changes to regimen today.  Follow up annually with urology      Essential hypertension, benign    Well controlled on current regimen of amlodipine 2.5 mg daily.  home readings are < 130/80. Marland Kitchen Renal function stable, no changes today.      Relevant Orders   Comprehensive metabolic panel (Completed)   Family history of breast cancer    Continue annual screening mammogams.  BRCA testing was negative Oct 2015      RESOLVED: Hyperlipidemia   Relevant Orders   Lipid panel (Completed)   TSH (Completed)   Hyperlipidemia with target LDL less than 130    Previous trial  of statin caused S/E of memory loss which subjectively improved with cessation.  Her ten year risk of CAD is 15%.  She Tolerating Zetia, LDL is < 130 . No changes today   Lab Results  Component Value Date   CHOL 205 (H) 03/18/2021   HDL 66.80 03/18/2021   LDLCALC 126 (H) 03/18/2021   LDLDIRECT 173.0 06/06/2018   TRIG 59.0 03/18/2021   CHOLHDL 3 03/18/2021         Osteopenia after menopause    Has been taking weekly alendronate since 2009 (started by [facility]) with periods of suspension ,  Last DEeXA was in 2019 and T scores were -2.0   Will stop and repeat DEXA.  There is a family history of clotting disorder so Evista is  contraindicated      Relevant Orders   DG Bone Density   Tubular adenoma of colon    A 3 mm TA was removed from colon March 2022.  5 yr follow up planned.       Vitamin D deficiency    Other Visit Diagnoses    COVID-19 vaccine administered    -  Primary   Relevant Orders   SARS-CoV-2 Semi-Quantitative Total Antibody, Spike   Fatigue, unspecified type       Relevant Orders   CBC with Differential/Platelet (Completed)     A total of 40 minutes was spent with patient more than half of which was spent in counseling patient on management of osteopenia, nephrolithiasis,  COVID ,  ,  reviewing and explaining recent labs and imaging studies done, and coordination of care.   I am having Jezebelle Ledwell. Sprenkle maintain her triamcinolone, meloxicam, mupirocin ointment, Cholecalciferol, EPINEPHrine, Lithium Carbonate, amLODipine, alendronate, ezetimibe, and Gemtesa.  No orders of the defined types were placed in this encounter.   There are no discontinued medications.  Follow-up: Return in about 6 months (around 09/18/2021).   [name], MD

## 2021-03-20 NOTE — Assessment & Plan Note (Signed)
Continue annual screening mammogams.  BRCA testing was negative Oct 2015 

## 2021-03-20 NOTE — Assessment & Plan Note (Signed)
A 3 mm TA was removed from colon March 2022.  5 yr follow up planned.

## 2021-03-20 NOTE — Assessment & Plan Note (Signed)
Well controlled on current regimen of amlodipine 2.5 mg daily.  home readings are < 130/80. Marland Kitchen Renal function stable, no changes today.

## 2021-03-20 NOTE — Assessment & Plan Note (Signed)
Previous trial  of statin caused S/E of memory loss which subjectively improved with cessation.  Her ten year risk of CAD is 15%.  She Tolerating Zetia, LDL is < 130 . No changes today   Lab Results  Component Value Date   CHOL 205 (H) 03/18/2021   HDL 66.80 03/18/2021   LDLCALC 126 (H) 03/18/2021   LDLDIRECT 173.0 06/06/2018   TRIG 59.0 03/18/2021   CHOLHDL 3 03/18/2021

## 2021-03-20 NOTE — Assessment & Plan Note (Signed)
She has resumed dietary calcium  And has not had a recurrence since starting the lithium carbonate supplement advised by her urologist Dr Stoioff:/  She has 2 nonobstructing stones that have not caused symptoms .  No changes to regimen today.  Follow up annually with urology

## 2021-03-22 LAB — SARS-COV-2 SEMI-QUANTITATIVE TOTAL ANTIBODY, SPIKE: SARS COV2 AB, Total Spike Semi QN: >2500 U/mL — ABNORMAL HIGH (ref <0.8)

## 2021-03-30 ENCOUNTER — Other Ambulatory Visit: Payer: Self-pay

## 2021-03-30 ENCOUNTER — Ambulatory Visit
Admission: RE | Admit: 2021-03-30 | Discharge: 2021-03-30 | Disposition: A | Discharge status: Home / Self Care | Payer: Medicare Other | Source: Ambulatory Visit | Attending: Internal Medicine | Admitting: Internal Medicine

## 2021-03-30 DIAGNOSIS — M8589 Other specified disorders of bone density and structure, multiple sites: Secondary | ICD-10-CM | POA: Diagnosis not present

## 2021-03-30 DIAGNOSIS — Z780 Asymptomatic menopausal state: Secondary | ICD-10-CM | POA: Insufficient documentation

## 2021-03-30 DIAGNOSIS — M8580 Other specified disorders of bone density and structure, unspecified site: Secondary | ICD-10-CM | POA: Insufficient documentation

## 2021-04-14 MED ORDER — DOXYCYCLINE HYCLATE 100 MG PO TABS: 100.0000 mg | ORAL_TABLET | Freq: Two times a day (BID) | ORAL | 1 refills | Status: DC

## 2021-04-20 DIAGNOSIS — Z20822 Contact with and (suspected) exposure to covid-19: Secondary | ICD-10-CM | POA: Diagnosis not present

## 2021-04-20 DIAGNOSIS — Z03818 Encounter for observation for suspected exposure to other biological agents ruled out: Secondary | ICD-10-CM | POA: Diagnosis not present

## 2021-04-28 ENCOUNTER — Ambulatory Visit (INDEPENDENT_AMBULATORY_CARE_PROVIDER_SITE_OTHER): Payer: Medicare Other | Admitting: Internal Medicine

## 2021-04-28 ENCOUNTER — Other Ambulatory Visit (HOSPITAL_COMMUNITY)
Admission: RE | Admit: 2021-04-28 | Discharge: 2021-04-28 | Disposition: A | Discharge status: Home / Self Care | Payer: Medicare Other | Source: Ambulatory Visit | Attending: Internal Medicine | Admitting: Internal Medicine

## 2021-04-28 ENCOUNTER — Encounter: Payer: Self-pay | Admitting: Internal Medicine

## 2021-04-28 ENCOUNTER — Other Ambulatory Visit: Payer: Self-pay

## 2021-04-28 VITALS — BP 132/74 | HR 65 | Temp 96.9°F | Resp 14 | Ht 60.75 in | Wt 120.6 lb

## 2021-04-28 DIAGNOSIS — Z1151 Encounter for screening for human papillomavirus (HPV): Secondary | ICD-10-CM | POA: Insufficient documentation

## 2021-04-28 DIAGNOSIS — R102 Pelvic and perineal pain: Secondary | ICD-10-CM | POA: Diagnosis not present

## 2021-04-28 DIAGNOSIS — Z01419 Encounter for gynecological examination (general) (routine) without abnormal findings: Secondary | ICD-10-CM | POA: Diagnosis present

## 2021-04-28 DIAGNOSIS — M8580 Other specified disorders of bone density and structure, unspecified site: Secondary | ICD-10-CM

## 2021-04-28 DIAGNOSIS — Z780 Asymptomatic menopausal state: Secondary | ICD-10-CM | POA: Diagnosis not present

## 2021-04-28 LAB — URINALYSIS, ROUTINE W REFLEX MICROSCOPIC
Bilirubin Urine: NEGATIVE
Hgb urine dipstick: NEGATIVE
Ketones, ur: NEGATIVE
Leukocytes,Ua: NEGATIVE
Nitrite: NEGATIVE
RBC / HPF: NONE SEEN (ref 0–?)
Specific Gravity, Urine: 1.02 (ref 1.000–1.030)
Total Protein, Urine: NEGATIVE
Urine Glucose: NEGATIVE
Urobilinogen, UA: 0.2 (ref 0.0–1.0)
pH: 6 (ref 5.0–8.0)

## 2021-04-28 NOTE — Progress Notes (Signed)
Subjective:  Patient ID: [name], female    DOB: 1951-01-24  Age: 70 y.o. MRN: [id number]  CC: The primary encounter diagnosis was Pelvic pain. A diagnosis of Osteopenia after menopause was also pertinent to this visit.  HPI [name] presents for persistent lower right quadrant  pelvic discomfort  This visit occurred during the SARS-CoV-2 public health emergency.  Safety protocols were in place, including screening questions prior to the visit, additional usage of staff PPE, and extensive cleaning of exam room while observing appropriate contact time as indicated for disinfecting solutions.   Urologic history: multiple bilateral stones,  s/p shockwave lithotripsy June 2020 on the right. retained kidney stones on  last abd plain films,  tilted uterus.  Discomfort described as "something moving  I nside me."  Not aggravated by motion  intercourse or defecation.  No blood in urine.    Last contrasted CT 2012 cuased severe allergic reaction due to iodine allergy  Pelvic and pap done.  Abd exam benign   Outpatient Medications Prior to Visit  Medication Sig Dispense Refill   alendronate (FOSAMAX) 70 MG tablet Take 1 tablet (70 mg total) by mouth once a week. Take with a full glass of water on an empty stomach. 20 tablet 3   amLODipine (NORVASC) 2.5 MG tablet TAKE 1 TABLET AT BEDTIME 90 tablet 2   Cholecalciferol 100 MCG (4000 UT) CAPS Take by mouth.     EPINEPHrine (EPIPEN 2-PAK) 0.3 mg/0.3 mL IJ SOAJ injection Inject 0.3 mLs (0.3 mg total) into the muscle as needed for anaphylaxis. 1 each 1   ezetimibe (ZETIA) 10 MG tablet TAKE 1 TABLET BY MOUTH EVERY DAY 90 tablet 1   Lithium Carbonate POWD      meloxicam (MOBIC) 7.5 MG tablet Take 1 tablet (7.5 mg total) by mouth as needed. 90 tablet 1   mupirocin ointment (BACTROBAN) 2 % Place 1 application into the nose 2 (two) times daily. As needed 22 g 5   triamcinolone (NASACORT) 55 MCG/ACT AERO nasal inhaler Place 2 sprays into the nose as  needed.     Vibegron (GEMTESA) 75 MG TABS Take 75 mg by mouth daily. 30 tablet 0   doxycycline (VIBRA-TABS) 100 MG tablet Take 1 tablet (100 mg total) by mouth 2 (two) times daily. 20 tablet 1   No facility-administered medications prior to visit.    Review of Systems;  Patient denies headache, fevers, malaise, unintentional weight loss, skin rash, eye pain, sinus congestion and sinus pain, sore throat, dysphagia,  hemoptysis , cough, dyspnea, wheezing, chest pain, palpitations, orthopnea, edema, abdominal pain, nausea, melena, diarrhea, constipation, flank pain, dysuria, hematuria, urinary  Frequency, nocturia, numbness, tingling, seizures,  Focal weakness, Loss of consciousness,  Tremor, insomnia, depression, anxiety, and suicidal ideation.      Objective:  BP 132/74 (BP Location: Left Arm, Patient Position: Sitting, Cuff Size: Normal)   Pulse 65   Temp (!) 96.9 F (36.1 C) (Temporal)   Resp 14   Ht 5' 0.75" (1.543 m)   Wt 120 lb 9.6 oz (54.7 kg)   SpO2 97%   BMI 22.98 kg/m   BP Readings from Last 3 Encounters:  04/28/21 132/74  03/18/21 132/80  03/10/21 131/76    Wt Readings from Last 3 Encounters:  04/28/21 120 lb 9.6 oz (54.7 kg)  03/18/21 121 lb 6.4 oz (55.1 kg)  03/10/21 119 lb (54 kg)    General appearance: alert, cooperative and appears stated age Ears: normal TM's and  external ear canals both ears Throat: lips, mucosa, and tongue normal; teeth and gums normal Neck: no adenopathy, no carotid bruit, supple, symmetrical, trachea midline and thyroid not enlarged, symmetric, no tenderness/mass/nodules Back: symmetric, no curvature. ROM normal. No CVA tenderness. Lungs: clear to auscultation bilaterally Heart: regular rate and rhythm, S1, S2 normal, no murmur, click, rub or gallop Abdomen: soft, non-tender; bowel sounds normal; no masses,  no organomegaly GYN:  vaginal walls normal cervix nontender , nonfriable , no adnexal tenderness, ovaries nonpalpable Pulses: 2+  and symmetric Skin: Skin color, texture, turgor normal. No rashes or lesions Lymph nodes: Cervical, supraclavicular, and axillary nodes normal.  No results found for: HGBA1C  Lab Results  Component Value Date   CREATININE 0.73 03/18/2021   CREATININE 0.84 03/11/2020   CREATININE 0.83 11/14/2019    Lab Results  Component Value Date   WBC 3.8 (L) 03/18/2021   HGB 14.4 03/18/2021   HCT 42.6 03/18/2021   PLT 167.0 03/18/2021   GLUCOSE 84 03/18/2021   CHOL 205 (H) 03/18/2021   TRIG 59.0 03/18/2021   HDL 66.80 03/18/2021   LDLDIRECT 173.0 06/06/2018   LDLCALC 126 (H) 03/18/2021   ALT 17 03/18/2021   AST 21 03/18/2021   NA 141 03/18/2021   K 4.3 03/18/2021   CL 105 03/18/2021   CREATININE 0.73 03/18/2021   BUN 20 03/18/2021   CO2 28 03/18/2021   TSH 2.30 03/18/2021    DG Bone Density  Result Date: 03/30/2021 EXAM: DUAL X-RAY ABSORPTIOMETRY (DXA) FOR BONE MINERAL DENSITY IMPRESSION: crr Your patient [name] completed a BMD test on 03/30/2021 using the [employer] (analysis version: 14.10) manufactured by [employer]. The following summarizes the results of our evaluation. PATIENT BIOGRAPHICAL: Name: Debra, Solomon Patient ID: [id number] Birth Date: 02/25/1951 Height: 61.0 in. Gender: Female Exam Date: 03/30/2021 Weight: 121.6 lbs. Indications: Advanced Age, arthritis, Caucasian, Height Loss, Low Body Weight, POSTmenopausal, Rheumatoid Arthritis Fractures: Treatments: FOSAMAX, nasacort, Vitamin D ASSESSMENT: The BMD measured at Femur Neck Left is 0.729 g/cm2 with a T-score of -2.2. This patient is considered osteopenic according to [facility]) criteria.L- 3&4 were excluded due to degenerative changes/ surgical hardware. The scan quality is limited by exclusion of L-3&4. Patient was not a candidate for FRAX assessment due to Fosamax use. Site Region Measured Measured WHO Young Adult BMD Date       Age      Classification T-score AP Spine  L1-L2 03/30/2021 71.8 Osteopenia -1.3 1.008 g/cm2 AP Spine L1-L2 07/18/2018 69.1 Normal -1.0 1.047 g/cm2 DualFemur Neck Left 03/30/2021 71.8 Osteopenia -2.2 0.729 g/cm2 DualFemur Neck Left 07/18/2018 69.1 Osteopenia -2.0 0.754 g/cm2 DualFemur Total Mean 03/30/2021 71.8 Osteopenia -2.1 0.746 g/cm2 DualFemur Total Mean 07/18/2018 69.1 Osteopenia -1.9 0.766 g/cm2 World Health Organization [facility]) criteria for post-menopausal, Caucasian Women: Normal:       T-score at or above -1 SD Osteopenia:   T-score between -1 and -2.5 SD Osteoporosis: T-score at or below -2.5 SD RECOMMENDATIONS: 1. All patients should optimize calcium and vitamin D intake. 2. Consider FDA-approved medical therapies in postmenopausal women and men aged 47 years and older, based on the following: a. A hip or vertebral (clinical or morphometric) fracture b. T-score < -2.5 at the femoral neck or spine after appropriate evaluation to exclude secondary causes c. Low bone mass (T-score between -1.0 and -2.5 at the femoral neck or spine) and a 10-year probability of a hip fracture > 3% or a 10-year probability of a  major osteoporosis-related fracture > 20% based on the [place]-adapted WHO algorithm d. Clinician judgment and/or patient preferences may indicate treatment for people with 10-year fracture probabilities above or below these levels FOLLOW-UP: People with diagnosed cases of osteoporosis or at high risk for fracture should have regular bone mineral density tests. For patients eligible for Medicare, routine testing is allowed once every 2 years. The testing frequency can be increased to one year for patients who have rapidly progressing disease, those who are receiving or discontinuing medical therapy to restore bone mass, or have additional risk factors. I have reviewed this report, and agree with the above findings. Mark A. Thornton Papas, M.D. [facility] Radiology Electronically Signed   By: Lavonia Dana M.D.   On: 03/30/2021 15:55    Assessment & Plan:    Problem List Items Addressed This Visit       Unprioritized   Osteopenia after menopause    Has been taking weekly alendronate since 2009 (started by former PCP Andreassi) with periods of suspension ,  Last DEXA was in 2019 and T scores were -2.0 at the left femur and have progressed .  Stopping medication for a minimum of one year.  Current DEXA notes  T score of -2.2 at left femur .  There is a family history of clotting disorder so Evista is contraindicated       Pelvic pain - Primary    Etiology unclear.  Pelvic exam normal,  UA without hematuria. Needs to rule out ovarian mass.   MRI pelvis ordered given age ,  History of nephrolithiasis, normal colonoscopy March 2021 ,  and IV iodinated contrast allergy       Relevant Orders   Cytology - PAP (Completed)   Urinalysis, Routine w reflex microscopic (Completed)   MR Pelvis W Wo Contrast  A total of 40 minutes was spent with patient and husband, more than half of which was spent in counseling patient on the differential  diagnosis of pelvic pain, , reviewing and explaining recent labs and imaging studies done, and coordination of care.   I have discontinued Noelia Lenart. Bina's doxycycline. I am also having her maintain her triamcinolone, meloxicam, mupirocin ointment, Cholecalciferol, EPINEPHrine, Lithium Carbonate, amLODipine, alendronate, ezetimibe, and Gemtesa.  No orders of the defined types were placed in this encounter.   Medications Discontinued During This Encounter  Medication Reason   doxycycline (VIBRA-TABS) 100 MG tablet     Follow-up: No follow-ups on file.   [name], MD

## 2021-04-29 LAB — CYTOLOGY - PAP
Comment: NEGATIVE
Diagnosis: NEGATIVE
High risk HPV: NEGATIVE

## 2021-05-01 DIAGNOSIS — R102 Pelvic and perineal pain: Secondary | ICD-10-CM | POA: Insufficient documentation

## 2021-05-01 NOTE — Assessment & Plan Note (Signed)
Etiology unclear.  Pelvic exam normal,  UA without hematuria. Needs to rule out ovarian mass.   MRI pelvis ordered given age ,  History of nephrolithiasis, normal colonoscopy March 2021 ,  and IV iodinated contrast allergy

## 2021-05-01 NOTE — Assessment & Plan Note (Signed)
Has been taking weekly alendronate since 2009 (started by former PCP Andreassi) with periods of suspension ,  Last DEXA was in 2019 and T scores were -2.0 at the left femur and have progressed .  Stopping medication for a minimum of one year.  Current DEXA notes  T score of -2.2 at left femur .  There is a family history of clotting disorder so Evista is contraindicated

## 2021-05-07 ENCOUNTER — Telehealth: Payer: Self-pay | Admitting: Internal Medicine

## 2021-05-07 ENCOUNTER — Other Ambulatory Visit: Payer: Self-pay

## 2021-05-07 ENCOUNTER — Ambulatory Visit
Admission: RE | Admit: 2021-05-07 | Discharge: 2021-05-07 | Disposition: A | Discharge status: Home / Self Care | Payer: Medicare Other | Source: Ambulatory Visit | Attending: Internal Medicine | Admitting: Internal Medicine

## 2021-05-07 DIAGNOSIS — R102 Pelvic and perineal pain: Secondary | ICD-10-CM | POA: Insufficient documentation

## 2021-05-07 DIAGNOSIS — Z981 Arthrodesis status: Secondary | ICD-10-CM | POA: Diagnosis not present

## 2021-05-07 MED ORDER — GADOBUTROL 1 MMOL/ML IV SOLN
5.0000 mL | Freq: Once | INTRAVENOUS | Status: AC | PRN
  Administered 2021-05-07: 5 mL via INTRAVENOUS

## 2021-05-07 NOTE — Telephone Encounter (Signed)
[name] from [facility] has a question in regards to the orders for today visit for the PT. They can be reached at [phone]

## 2021-05-07 NOTE — Telephone Encounter (Signed)
Spoke with [place] and the gentleman there stated that [place] had stepped out for a moment but that he thought she had figured out what was needed. He stated that he would have her give me a call back if not.

## 2021-05-12 ENCOUNTER — Ambulatory Visit: Payer: PRIVATE HEALTH INSURANCE | Admitting: Internal Medicine

## 2021-05-26 DIAGNOSIS — Z03818 Encounter for observation for suspected exposure to other biological agents ruled out: Secondary | ICD-10-CM | POA: Diagnosis not present

## 2021-05-26 DIAGNOSIS — Z20822 Contact with and (suspected) exposure to covid-19: Secondary | ICD-10-CM | POA: Diagnosis not present

## 2021-06-29 ENCOUNTER — Other Ambulatory Visit: Payer: Self-pay | Admitting: Internal Medicine

## 2021-07-05 ENCOUNTER — Other Ambulatory Visit: Payer: Self-pay

## 2021-07-05 MED ORDER — AMLODIPINE BESYLATE 2.5 MG PO TABS: 2.5000 mg | ORAL_TABLET | Freq: Every day | ORAL | 1 refills | Status: DC

## 2021-07-05 MED ORDER — AMLODIPINE BESYLATE 2.5 MG PO TABS: 2.5000 mg | ORAL_TABLET | Freq: Every day | ORAL | 2 refills | Status: DC

## 2021-07-06 DIAGNOSIS — Z23 Encounter for immunization: Secondary | ICD-10-CM | POA: Diagnosis not present

## 2021-09-20 ENCOUNTER — Encounter: Payer: Self-pay | Admitting: Internal Medicine

## 2021-09-20 ENCOUNTER — Other Ambulatory Visit: Payer: Self-pay

## 2021-09-20 ENCOUNTER — Ambulatory Visit (INDEPENDENT_AMBULATORY_CARE_PROVIDER_SITE_OTHER): Payer: Medicare Other | Admitting: Internal Medicine

## 2021-09-20 VITALS — BP 116/74 | HR 71 | Temp 96.2°F | Ht 61.0 in | Wt 120.2 lb

## 2021-09-20 DIAGNOSIS — M064 Inflammatory polyarthropathy: Secondary | ICD-10-CM | POA: Diagnosis not present

## 2021-09-20 DIAGNOSIS — R5383 Other fatigue: Secondary | ICD-10-CM | POA: Diagnosis not present

## 2021-09-20 DIAGNOSIS — I10 Essential (primary) hypertension: Secondary | ICD-10-CM

## 2021-09-20 DIAGNOSIS — Z780 Asymptomatic menopausal state: Secondary | ICD-10-CM | POA: Diagnosis not present

## 2021-09-20 DIAGNOSIS — E785 Hyperlipidemia, unspecified: Secondary | ICD-10-CM | POA: Diagnosis not present

## 2021-09-20 DIAGNOSIS — M8580 Other specified disorders of bone density and structure, unspecified site: Secondary | ICD-10-CM

## 2021-09-20 LAB — COMPREHENSIVE METABOLIC PANEL
ALT: 16 U/L (ref 0–35)
AST: 21 U/L (ref 0–37)
Albumin: 4.4 g/dL (ref 3.5–5.2)
Alkaline Phosphatase: 53 U/L (ref 39–117)
BUN: 12 mg/dL (ref 6–23)
CO2: 29 mEq/L (ref 19–32)
Calcium: 9.3 mg/dL (ref 8.4–10.5)
Chloride: 104 mEq/L (ref 96–112)
Creatinine, Ser: 0.79 mg/dL (ref 0.40–1.20)
GFR: 74.79 mL/min (ref >60.00)
Glucose, Bld: 93 mg/dL (ref 70–99)
Potassium: 4.1 mEq/L (ref 3.5–5.1)
Sodium: 141 mEq/L (ref 135–145)
Total Bilirubin: 0.6 mg/dL (ref 0.2–1.2)
Total Protein: 7.1 g/dL (ref 6.0–8.3)

## 2021-09-20 LAB — LIPID PANEL
Cholesterol: 208 mg/dL — ABNORMAL HIGH (ref 0–200)
HDL: 67.2 mg/dL (ref >39.00)
LDL Cholesterol: 119 mg/dL — ABNORMAL HIGH (ref 0–99)
NonHDL: 141.18
Total CHOL/HDL Ratio: 3
Triglycerides: 111 mg/dL (ref 0.0–149.0)
VLDL: 22.2 mg/dL (ref 0.0–40.0)

## 2021-09-20 NOTE — Assessment & Plan Note (Signed)
Previous trial  of statin caused S/E of memory loss which subjectively improved with cessation.  Her ten year risk of CAD is 15%.  She Tolerating Zetia, LDL is < 130 . No changes today   Lab Results  Component Value Date   CHOL 205 (H) 03/18/2021   HDL 66.80 03/18/2021   LDLCALC 126 (H) 03/18/2021   LDLDIRECT 173.0 06/06/2018   TRIG 59.0 03/18/2021   CHOLHDL 3 03/18/2021

## 2021-09-20 NOTE — Assessment & Plan Note (Addendum)
Husband Ed,  A retired physicians,  Is receiving palliative chemotherapy for unresectable pancreatic CA and has been very angry,helpless,  Resentful and  emotionally labile.  She had a case of "burnout" managed with some retail therapy .  Sons are supportive but husband refuses to let them in at times .

## 2021-09-20 NOTE — Patient Instructions (Signed)
I'll see you again in 6 months, sooner if needed  Please Let me know if you or Ed  need anything for pain or mood changes (anxiety)

## 2021-09-20 NOTE — Assessment & Plan Note (Signed)
Well controlled on current regimen of amlodipine 2.5 mg daily.  home readings are < 130/80. Marland Kitchen Renal function stable, no changes today.

## 2021-09-20 NOTE — Assessment & Plan Note (Signed)
Last dose of alendronate is this week

## 2021-09-20 NOTE — Progress Notes (Signed)
Subjective:  Patient ID: [name], female    DOB: 1951-10-16  Age: 70 y.o. MRN: [id number]  CC: The primary encounter diagnosis was Essential hypertension, benign. Diagnoses of Hyperlipidemia with target LDL less than 130, Inflammatory polyarthropathy ([place]), Caregiver with fatigue, and Osteopenia after menopause were also pertinent to this visit.  HPI [name] presents for follow up on hypertension, hyperlipidemia and osteopenia, currenlty on drug holiday from alendronate since July 2022  Chief Complaint  Patient presents with   Follow-up    6 month follow up    This visit occurred during the SARS-CoV-2 public health emergency.  Safety protocols were in place, including screening questions prior to the visit, additional usage of staff PPE, and extensive cleaning of exam room while observing appropriate contact time as indicated for disinfecting solutions.   Positive depression screen:  she denies depression but reports caregiver fatigue.  Her  husband [name] is currently receiving  modified chemotherapy regimen  at [facility] for unresectable pancreatic CA  diagnosed in June 2022.  He has not had chemo in several weeks due to low blood counts.  He feels better when he foregoes chemo; the diarrhea and dehydration have improved.    He has developed an L5 vertebral compression fracture resulting from the chemo and has trouble getting out of bed unassisted  and shuffles when he walks.   He is given to fits of anger  and has had some memory loss she describes as "brain fog"  .He has lost his sense of smell and recently refused to shower for 5 days .   He refuses to accept  a hospital bed and/or lift chair.  They are enjoying a new grandchild one month old but he initially refused to see the infant at the birthing center.  He has told his children and family that he prefers to die before anything gets worse.   He has no spiritual beliefs or belief in a life hereafter. His prognosis depends on his  ability to continue palliative chemo ( 6 months to one year at best)   He has also been undecided about Home Hospice, but reminds her daily that he is dying.    She has become very frustrated with his demanding nature and exaggerated helplessness.   She has moved into another bedroom.  She uses retail therapy.    HTN/HLD :  Patient is taking her medications as prescribed  (amlodipine and zetia) and notes no adverse effects.  Home BP readings have been done about once per week and are  generally < 130/80 .  She is avoiding added salt in her diet and walking regularly about 3 times per week for exercise  .     Outpatient Medications Prior to Visit  Medication Sig Dispense Refill   alendronate (FOSAMAX) 70 MG tablet Take 1 tablet (70 mg total) by mouth once a week. Take with a full glass of water on an empty stomach. 20 tablet 3   amLODipine (NORVASC) 2.5 MG tablet Take 1 tablet (2.5 mg total) by mouth at bedtime. 90 tablet 1   Cholecalciferol 100 MCG (4000 UT) CAPS Take by mouth.     EPINEPHrine (EPIPEN 2-PAK) 0.3 mg/0.3 mL IJ SOAJ injection Inject 0.3 mLs (0.3 mg total) into the muscle as needed for anaphylaxis. 1 each 1   ezetimibe (ZETIA) 10 MG tablet TAKE 1 TABLET BY MOUTH EVERY DAY 90 tablet 1   Lithium Carbonate POWD      meloxicam (MOBIC)  7.5 MG tablet Take 1 tablet (7.5 mg total) by mouth as needed. 90 tablet 1   mupirocin ointment (BACTROBAN) 2 % Place 1 application into the nose 2 (two) times daily. As needed 22 g 5   triamcinolone (NASACORT) 55 MCG/ACT AERO nasal inhaler Place 2 sprays into the nose as needed.     Vibegron (GEMTESA) 75 MG TABS Take 75 mg by mouth daily. 30 tablet 0   No facility-administered medications prior to visit.    Review of Systems;  Patient denies headache, fevers, malaise, unintentional weight loss, skin rash, eye pain, sinus congestion and sinus pain, sore throat, dysphagia,  hemoptysis , cough, dyspnea, wheezing, chest pain, palpitations, orthopnea,  edema, abdominal pain, nausea, melena, diarrhea, constipation, flank pain, dysuria, hematuria, urinary  Frequency, nocturia, numbness, tingling, seizures,  Focal weakness, Loss of consciousness,  Tremor, insomnia, depression, anxiety, and suicidal ideation.      Objective:  BP 116/74 (BP Location: Left Arm, Patient Position: Sitting, Cuff Size: Normal)   Pulse 71   Temp (!) 96.2 F (35.7 C) (Temporal)   Ht '5\' 1"'  (1.549 m)   Wt 120 lb 3.2 oz (54.5 kg)   SpO2 98%   BMI 22.71 kg/m   BP Readings from Last 3 Encounters:  09/20/21 116/74  04/28/21 132/74  03/18/21 132/80    Wt Readings from Last 3 Encounters:  09/20/21 120 lb 3.2 oz (54.5 kg)  04/28/21 120 lb 9.6 oz (54.7 kg)  03/18/21 121 lb 6.4 oz (55.1 kg)    General appearance: alert, cooperative and appears stated age Ears: normal TM's and external ear canals both ears Throat: lips, mucosa, and tongue normal; teeth and gums normal Neck: no adenopathy, no carotid bruit, supple, symmetrical, trachea midline and thyroid not enlarged, symmetric, no tenderness/mass/nodules Back: symmetric, no curvature. ROM normal. No CVA tenderness. Lungs: clear to auscultation bilaterally Heart: regular rate and rhythm, S1, S2 normal, no murmur, click, rub or gallop Abdomen: soft, non-tender; bowel sounds normal; no masses,  no organomegaly Pulses: 2+ and symmetric Skin: Skin color, texture, turgor normal. No rashes or lesions Lymph nodes: Cervical, supraclavicular, and axillary nodes normal.  No results found for: HGBA1C  Lab Results  Component Value Date   CREATININE 0.73 03/18/2021   CREATININE 0.84 03/11/2020   CREATININE 0.83 11/14/2019    Lab Results  Component Value Date   WBC 3.8 (L) 03/18/2021   HGB 14.4 03/18/2021   HCT 42.6 03/18/2021   PLT 167.0 03/18/2021   GLUCOSE 84 03/18/2021   CHOL 205 (H) 03/18/2021   TRIG 59.0 03/18/2021   HDL 66.80 03/18/2021   LDLDIRECT 173.0 06/06/2018   LDLCALC 126 (H) 03/18/2021   ALT 17  03/18/2021   AST 21 03/18/2021   NA 141 03/18/2021   K 4.3 03/18/2021   CL 105 03/18/2021   CREATININE 0.73 03/18/2021   BUN 20 03/18/2021   CO2 28 03/18/2021   TSH 2.30 03/18/2021    MR Pelvis W Wo Contrast  Result Date: 05/10/2021 CLINICAL DATA:  Left pelvic and groin pain for approximately 3 weeks. EXAM: MRI PELVIS WITHOUT AND WITH CONTRAST TECHNIQUE: Multiplanar multisequence MR imaging of the pelvis was performed both before and after administration of intravenous contrast. CONTRAST:  68m GADAVIST GADOBUTROL 1 MMOL/ML IV SOLN COMPARISON:  None. FINDINGS: Lower Urinary Tract: No urinary bladder or urethral abnormality identified. Bowel: Unremarkable pelvic bowel loops. Vascular/Lymphatic: Unremarkable. No pathologically enlarged pelvic lymph nodes identified. Reproductive: -- Uterus: Measures 4.9 x 2.4 x 4.5 cm (volume =  28 cm^3). No fibroids or other masses identified. Cervix and vagina are unremarkable. -- Right ovary: Normal postmenopausal appearance. No ovarian or adnexal masses identified. -- Left ovary: Normal postmenopausal appearance. No ovarian or adnexal masses identified. Other: No peritoneal thickening or abnormal free fluid. Musculoskeletal:  Unremarkable.  Lumbar spine fusion hardware noted. IMPRESSION: Normal postmenopausal appearance of uterus and both ovaries. No pelvic mass or other significant abnormality identified. Electronically Signed   By: Marlaine Hind M.D.   On: 05/10/2021 11:20    Assessment & Plan:   Problem List Items Addressed This Visit     Essential hypertension, benign - Primary    Well controlled on current regimen of amlodipine 2.5 mg daily.  home readings are < 130/80. Marland Kitchen Renal function stable, no changes today.      Relevant Orders   Comp Met (CMET)   Hyperlipidemia with target LDL less than 130    Previous trial  of statin caused S/E of memory loss which subjectively improved with cessation.  Her ten year risk of CAD is 15%.  She Tolerating Zetia,  LDL is < 130 . No changes today   Lab Results  Component Value Date   CHOL 205 (H) 03/18/2021   HDL 66.80 03/18/2021   LDLCALC 126 (H) 03/18/2021   LDLDIRECT 173.0 06/06/2018   TRIG 59.0 03/18/2021   CHOLHDL 3 03/18/2021         Relevant Orders   Lipid Profile   Osteopenia after menopause    Last dose of alendronate is this week       Inflammatory polyarthropathy ([facility])   Caregiver with fatigue    Husband [name],  A retired physicians,  Is receiving palliative chemotherapy for unresectable pancreatic CA and has been very angry,helpless,  Resentful and  emotionally labile.  She had a case of "burnout" managed with some retail therapy .  Sons are supportive but husband refuses to let them in at times .      I provided  over 40 minutes  during this encounter reviewing patient's current problems and past surgeries, labs and imaging studies, providing counseling on the above mentioned problems I n a face to face visit  , and coordination  of care .  No orders of the defined types were placed in this encounter.   Medications Discontinued During This Encounter  Medication Reason   Vibegron (GEMTESA) 75 MG TABS     Follow-up: No follow-ups on file.   [name], MD

## 2021-11-18 ENCOUNTER — Ambulatory Visit (INDEPENDENT_AMBULATORY_CARE_PROVIDER_SITE_OTHER): Payer: Medicare Other

## 2021-11-18 VITALS — Ht 61.0 in | Wt 121.2 lb

## 2021-11-18 DIAGNOSIS — Z0000 Encounter for general adult medical examination without abnormal findings: Secondary | ICD-10-CM

## 2021-11-18 DIAGNOSIS — Z1231 Encounter for screening mammogram for malignant neoplasm of breast: Secondary | ICD-10-CM | POA: Diagnosis not present

## 2021-11-18 NOTE — Progress Notes (Addendum)
Subjective:   [name] is a 71 y.o. female who presents for Medicare Annual (Subsequent) preventive examination.  Review of Systems    No ROS.  Medicare Wellness Virtual Visit.  Visual/audio telehealth visit, UTA vital signs.   See social history for additional risk factors.   Cardiac Risk Factors include: advanced age (>34men, >37 women);hypertension     Objective:    Today's Vitals   11/18/21 0818  Weight: 121 lb 3.2 oz (55 kg)  Height: 5\' 1"  (1.549 m)   Body mass index is 22.9 kg/m.  Advanced Directives 11/18/2021 01/05/2021 11/17/2020 06/10/2020 05/13/2020 11/06/2019 03/28/2019  Does Patient Have a Medical Advance Directive? Yes Yes Yes Yes Yes Yes Yes  Type of Paramedic of [place];Living will - [place];Living will [place];Living will [place];Living will [place];Living will [place];Living will  Does patient want to make changes to medical advance directive? No - Patient declined - No - Patient declined No - Patient declined - No - Patient declined No - Patient declined  Copy of [place] in Chart? Yes - validated most recent copy scanned in chart (See row information) - Yes - validated most recent copy scanned in chart (See row information) Yes - validated most recent copy scanned in chart (See row information) Yes - validated most recent copy scanned in chart (See row information) No - copy requested -    Current Medications (verified) Outpatient Encounter Medications as of 11/18/2021  Medication Sig   alendronate (FOSAMAX) 70 MG tablet Take 1 tablet (70 mg total) by mouth once a week. Take with a full glass of water on an empty stomach.   amLODipine (NORVASC) 2.5 MG tablet Take 1 tablet (2.5 mg total) by mouth at bedtime.   Cholecalciferol 100 MCG (4000 UT) CAPS Take by mouth.   EPINEPHrine (EPIPEN 2-PAK) 0.3 mg/0.3 mL IJ SOAJ  injection Inject 0.3 mLs (0.3 mg total) into the muscle as needed for anaphylaxis.   ezetimibe (ZETIA) 10 MG tablet TAKE 1 TABLET BY MOUTH EVERY DAY   Lithium Carbonate POWD    meloxicam (MOBIC) 7.5 MG tablet Take 1 tablet (7.5 mg total) by mouth as needed.   mupirocin ointment (BACTROBAN) 2 % Place 1 application into the nose 2 (two) times daily. As needed   triamcinolone (NASACORT) 55 MCG/ACT AERO nasal inhaler Place 2 sprays into the nose as needed.   No facility-administered encounter medications on file as of 11/18/2021.    Allergies (verified) Contrast media [iodinated contrast media], Iodine, Milk-related compounds, Prednisolone, Corn-containing products, Penicillins, and Sulfa antibiotics   History: Past Medical History:  Diagnosis Date   Adenomatous polyps 2010   colon   Allergy    Arthritis    Chicken pox    Complication of anesthesia    took a while to wake up from lithotripsy   GERD (gastroesophageal reflux disease)    Hyperlipidemia    Hypertension    Kidney stone    Pneumonia    Past Surgical History:  Procedure Laterality Date   CATARACT EXTRACTION W/PHACO Left 05/13/2020   Procedure: CATARACT EXTRACTION PHACO AND INTRAOCULAR LENS PLACEMENT ([place]) LEFT;  Surgeon: [name], MD;  Location: [place];  Service: Ophthalmology;  Laterality: Left;  6.61 0:53.0 12.4%   CATARACT EXTRACTION W/PHACO Right 06/10/2020   Procedure: CATARACT EXTRACTION PHACO AND INTRAOCULAR LENS PLACEMENT ([place]) RIGHT VIVITY TORIC LENS;  Surgeon: [name], MD;  Location: [place];  Service: Ophthalmology;  Laterality: Right;  9.04 1:12.8 12.4%   COLONOSCOPY  2017   COLONOSCOPY WITH PROPOFOL N/A 01/05/2021   Procedure: COLONOSCOPY WITH PROPOFOL;  Surgeon: [name], MD;  [facility];  Service: Endoscopy;  Laterality: N/A;   EXTRACORPOREAL SHOCK WAVE LITHOTRIPSY Left 03/28/2019   Procedure: EXTRACORPOREAL SHOCK WAVE LITHOTRIPSY (ESWL);   Surgeon: [name], MD;  Location: [facility];  Service: Urology;  Laterality: Left;   SPINE SURGERY     Family History  Problem Relation Age of Onset   Cancer Mother    Stroke Mother    Heart disease Mother    Hypertension Mother    Diabetes Mother    Breast cancer Mother 36   Heart disease Father    Hypertension Father    Social History   Socioeconomic History   Marital status: Married    Spouse name: Not on file   Number of children: Not on file   Years of education: Not on file   Highest education level: Not on file  Occupational History   Not on file  Tobacco Use   Smoking status: Never   Smokeless tobacco: Never  Vaping Use   Vaping Use: Never used  Substance and Sexual Activity   Alcohol use: Yes    Alcohol/week: 3.0 standard drinks    Types: 3 Glasses of wine per week   Drug use: No   Sexual activity: Yes  Other Topics Concern   Not on file  Social History Narrative   Not on file   Social Determinants of Health   Financial Resource Strain: Low Risk    Difficulty of Paying Living Expenses: Not hard at all  Food Insecurity: No Food Insecurity   Worried About Charity fundraiser in the Last Year: Never true   [place] in the Last Year: Never true  Transportation Needs: No Transportation Needs   Lack of Transportation (Medical): No   Lack of Transportation (Non-Medical): No  Physical Activity: Not on file  Stress: No Stress Concern Present   Feeling of Stress : Not at all  Social Connections: Unknown   Frequency of Communication with Friends and Family: Not on file   Frequency of Social Gatherings with Friends and Family: Not on file   Attends Religious Services: Not on file   Active Member of Clubs or Organizations: Not on file   Attends Archivist Meetings: Not on file   Marital Status: Married    Tobacco Counseling Counseling given: Not Answered   Clinical Intake:  Pre-visit preparation completed: Yes         Diabetes: No  How often do you need to have someone help you when you read instructions, pamphlets, or other written materials from your doctor or pharmacy?: 1 - Never    Interpreter Needed?: No      Activities of Daily Living In your present state of health, do you have any difficulty performing the following activities: 11/18/2021  Hearing? N  Vision? N  Difficulty concentrating or making decisions? N  Walking or climbing stairs? N  Dressing or bathing? N  Doing errands, shopping? N  Preparing Food and eating ? N  Using the Toilet? N  In the past six months, have you accidently leaked urine? Y  Comment Followed by Urology, Dr. Bernardo Heater.  Do you have problems with loss of bowel control? N  Managing your Medications? N  Managing your Finances? N  Housekeeping or  managing your Housekeeping? N  Some recent data might be hidden    Patient Care Team: [name], MD as PCP - General (Internal Medicine)  Indicate any recent Medical Services you may have received from other than [facility] providers in the past year (date may be approximate).     Assessment:   This is a routine wellness examination for Nelissa.   Virtual Visit via Telephone Note  I connected with  Carollee Massed on 11/18/21 at  8:15 AM EST by telephone and verified that I am speaking with the correct person using two identifiers.  Persons participating in the virtual visit: patient/Nurse Health Advisor   I discussed the limitations, risks, security and privacy concerns of performing an evaluation and management service by telephone and the availability of in person appointments. The patient expressed understanding and agreed to proceed.  Interactive audio and video telecommunications were attempted between this nurse and patient, however failed, due to patient having technical difficulties OR patient did not have access to video capability.  We continued and completed visit with audio only.  Some vital  signs may be absent or patient reported.   Hearing/Vision screen Hearing Screening - Comments:: Patient is able to hear conversational tones without difficulty.  No issues reported. Vision Screening - Comments:: Followed by [facility]  Cataract extraction, bilateral   Dietary issues and exercise activities discussed: Current Exercise Habits: Home exercise routine, Type of exercise: stretching;calisthenics;strength training/weights (Elliptical), Time (Minutes): 30, Frequency (Times/Week): 7, Weekly Exercise (Minutes/Week): 210, Intensity: Mild Healthy diet  Milk based restriction Good water intake   Goals Addressed             This Visit's Progress    Follow up with Primary Care Provider       As needed       Depression Screen [facility] 2/9 Scores 11/18/2021 09/20/2021 04/28/2021 03/18/2021 11/17/2020 11/21/2019 11/06/2019  PHQ - 2 Score 0 2 0 1 0 0 0  PHQ- 9 Score 0 4 - 5 - - -    Fall Risk Fall Risk  11/18/2021 04/28/2021 03/18/2021 11/17/2020 03/10/2020  Falls in the past year? 0 0 0 0 0  Comment - - - - -  Number falls in past yr: 0 - - 0 -  Injury with Fall? - - - 0 -  Follow up Falls evaluation completed Falls evaluation completed Falls evaluation completed Falls evaluation completed Falls evaluation completed    [place]: Home free of loose throw rugs in walkways, pet beds, electrical cords, etc? Yes  Adequate lighting in your home to reduce risk of falls? Yes   ASSISTIVE DEVICES UTILIZED TO PREVENT FALLS: Use of a cane, walker or w/c? No   TIMED UP AND GO: Was the test performed? No .   Cognitive Function: Patient is alert and oriented x3.  MMSE - Mini Mental State Exam 11/06/2019 10/26/2017 10/15/2015  Not completed: Unable to complete - -  Orientation to time - 5 5  Orientation to Place - 5 5  Registration - 3 3  Attention/ Calculation - 5 5  Recall - 3 3  Language- name 2 objects - 2 2  Language- repeat - 1 1  Language-  follow 3 step command - 3 3  Language- read & follow direction - 1 1  Write a sentence - 1 1  Copy design - 1 1  Total score - 30 30     6CIT Screen 10/31/2018 10/12/2016  What Year? 0 points 0 points  What month? 0 points 0 points  What time? 0 points 0 points  Count back from 20 0 points 0 points  Months in reverse 0 points 0 points  Repeat phrase 0 points -  Total Score 0 -   Immunizations Immunization History  Administered Date(s) Administered   Fluad Quad(high Dose 65+) 06/24/2019   Influenza Split 07/26/2012, 08/27/2013   Influenza, High Dose Seasonal PF 08/25/2015, 08/15/2016, 07/15/2018, 07/06/2021   Influenza-Unspecified 08/29/2014, 08/01/2017, 08/07/2018, 07/22/2020   Moderna Sars-Covid-2 Vaccination 11/19/2019, 12/17/2019, 08/16/2020, 01/19/2021   Pfizer Covid-19 Vaccine Bivalent Booster 67yrs & up 07/06/2021   Pneumococcal Conjugate-13 09/02/2014   Pneumococcal Polysaccharide-23 06/27/2007, 10/07/2015   Tdap 12/25/2010, 12/13/2020   Zoster Recombinat (Shingrix) 12/11/2017, 04/21/2018   Zoster, Live 09/26/2013   Screening Tests Health Maintenance  Topic Date Due   MAMMOGRAM  03/08/2022   COLONOSCOPY (Pts 45-35yrs Insurance coverage will need to be confirmed)  01/05/2026   TETANUS/TDAP  12/13/2030   Pneumonia Vaccine 68+ Years old  Completed   INFLUENZA VACCINE  Completed   DEXA SCAN  Completed   COVID-19 Vaccine  Completed   Hepatitis C Screening  Completed   Zoster Vaccines- Shingrix  Completed   HPV VACCINES  Aged Out   Health Maintenance There are no preventive care reminders to display for this patient.  Mammogram status: Completed 02/2021. Repeat every year  Lung Cancer Screening: (Low Dose CT Chest recommended if Age 25-80 years, 30 pack-year currently smoking OR have quit w/in 15years.) does not qualify.   Vision Screening: Recommended annual ophthalmology exams for early detection of glaucoma and other disorders of the eye.  Dental Screening:  Recommended annual dental exams for proper oral hygiene  Community Resource Referral / Chronic Care Management: CRR required this visit?  No   CCM required this visit?  No      Plan:   Keep all routine maintenance appointments.   I have personally reviewed and noted the following in the patients chart:   Medical and social history Use of alcohol, tobacco or illicit drugs  Current medications and supplements including opioid prescriptions. Not taking opioids.  Functional ability and status Nutritional status Physical activity Advanced directives List of other physicians Hospitalizations, surgeries, and ER visits in previous 12 months Vitals Screenings to include cognitive, depression, and falls Referrals and appointments  In addition, I have reviewed and discussed with patient certain preventive protocols, quality metrics, and best practice recommendations. A written personalized care plan for preventive services as well as general preventive health recommendations were provided to patient.     OBrien-Blaney, Charlot Gouin L, LPN   9/77/[id number]    I have reviewed the above information and agree with above.   Deborra Medina, MD

## 2021-11-18 NOTE — Patient Instructions (Addendum)
[name] , Thank you for taking time to come for your Medicare Wellness Visit. I appreciate your ongoing commitment to your health goals. Please review the following plan we discussed and let me know if I can assist you in the future.   These are the goals we discussed:  Goals      Follow up with Primary Care Provider     As needed        This is a list of the screening recommended for you and due dates:  Health Maintenance  Topic Date Due   Mammogram  03/08/2022   Colon Cancer Screening  01/05/2026   Tetanus Vaccine  12/13/2030   Pneumonia Vaccine  Completed   Flu Shot  Completed   DEXA scan (bone density measurement)  Completed   COVID-19 Vaccine  Completed   Hepatitis C Screening: USPSTF Recommendation to screen - Ages 65-79 yo.  Completed   Zoster (Shingles) Vaccine  Completed   HPV Vaccine  Aged Out    Advanced directives: on file  Conditions/risks identified: none new  Follow up in one year for your annual wellness visit    Preventive Care 65 Years and Older, Female Preventive care refers to lifestyle choices and visits with your health care provider that can promote health and wellness. What does preventive care include? A yearly physical exam. This is also called an annual well check. Dental exams once or twice a year. Routine eye exams. Ask your health care provider how often you should have your eyes checked. Personal lifestyle choices, including: Daily care of your teeth and gums. Regular physical activity. Eating a healthy diet. Avoiding tobacco and drug use. Limiting alcohol use. Practicing safe sex. Taking low-dose aspirin every day. Taking vitamin and mineral supplements as recommended by your health care provider. What happens during an annual well check? The services and screenings done by your health care provider during your annual well check will depend on your age, overall health, lifestyle risk factors, and family history of  disease. Counseling  Your health care provider may ask you questions about your: Alcohol use. Tobacco use. Drug use. Emotional well-being. Home and relationship well-being. Sexual activity. Eating habits. History of falls. Memory and ability to understand (cognition). Work and work Statistician. Reproductive health. Screening  You may have the following tests or measurements: Height, weight, and BMI. Blood pressure. Lipid and cholesterol levels. These may be checked every 5 years, or more frequently if you are over 73 years old. Skin check. Lung cancer screening. You may have this screening every year starting at age 73 if you have a 30-pack-year history of smoking and currently smoke or have quit within the past 15 years. Fecal occult blood test (FOBT) of the stool. You may have this test every year starting at age 68. Flexible sigmoidoscopy or colonoscopy. You may have a sigmoidoscopy every 5 years or a colonoscopy every 10 years starting at age 45. Hepatitis C blood test. Hepatitis B blood test. Sexually transmitted disease (STD) testing. Diabetes screening. This is done by checking your blood sugar (glucose) after you have not eaten for a while (fasting). You may have this done every 1-3 years. Bone density scan. This is done to screen for osteoporosis. You may have this done starting at age 73. Mammogram. This may be done every 1-2 years. Talk to your health care provider about how often you should have regular mammograms. Talk with your health care provider about your test results, treatment options, and if necessary,  the need for more tests. Vaccines  Your health care provider may recommend certain vaccines, such as: Influenza vaccine. This is recommended every year. Tetanus, diphtheria, and acellular pertussis (Tdap, Td) vaccine. You may need a Td booster every 10 years. Zoster vaccine. You may need this after age 73. Pneumococcal 13-valent conjugate (PCV13) vaccine. One  dose is recommended after age 73. Pneumococcal polysaccharide (PPSV23) vaccine. One dose is recommended after age 73. Talk to your health care provider about which screenings and vaccines you need and how often you need them. This information is not intended to replace advice given to you by your health care provider. Make sure you discuss any questions you have with your health care provider. Document Released: 11/06/2015 Document Revised: 06/29/2016 Document Reviewed: 08/11/2015 Elsevier Interactive Patient Education  2017 [place] Prevention in the Home Falls can cause injuries. They can happen to people of all ages. There are many things you can do to make your home safe and to help prevent falls. What can I do on the outside of my home? Regularly fix the edges of walkways and driveways and fix any cracks. Remove anything that might make you trip as you walk through a door, such as a raised step or threshold. Trim any bushes or trees on the path to your home. Use bright outdoor lighting. Clear any walking paths of anything that might make someone trip, such as rocks or tools. Regularly check to see if handrails are loose or broken. Make sure that both sides of any steps have handrails. Any raised decks and porches should have guardrails on the edges. Have any leaves, snow, or ice cleared regularly. Use sand or salt on walking paths during winter. Clean up any spills in your garage right away. This includes oil or grease spills. What can I do in the bathroom? Use night lights. Install grab bars by the toilet and in the tub and shower. Do not use towel bars as grab bars. Use non-skid mats or decals in the tub or shower. If you need to sit down in the shower, use a plastic, non-slip stool. Keep the floor dry. Clean up any water that spills on the floor as soon as it happens. Remove soap buildup in the tub or shower regularly. Attach bath mats securely with double-sided  non-slip rug tape. Do not have throw rugs and other things on the floor that can make you trip. What can I do in the bedroom? Use night lights. Make sure that you have a light by your bed that is easy to reach. Do not use any sheets or blankets that are too big for your bed. They should not hang down onto the floor. Have a firm chair that has side arms. You can use this for support while you get dressed. Do not have throw rugs and other things on the floor that can make you trip. What can I do in the kitchen? Clean up any spills right away. Avoid walking on wet floors. Keep items that you use a lot in easy-to-reach places. If you need to reach something above you, use a strong step stool that has a grab bar. Keep electrical cords out of the way. Do not use floor polish or wax that makes floors slippery. If you must use wax, use non-skid floor wax. Do not have throw rugs and other things on the floor that can make you trip. What can I do with my stairs? Do not leave any items on  the stairs. Make sure that there are handrails on both sides of the stairs and use them. Fix handrails that are broken or loose. Make sure that handrails are as long as the stairways. Check any carpeting to make sure that it is firmly attached to the stairs. Fix any carpet that is loose or worn. Avoid having throw rugs at the top or bottom of the stairs. If you do have throw rugs, attach them to the floor with carpet tape. Make sure that you have a light switch at the top of the stairs and the bottom of the stairs. If you do not have them, ask someone to add them for you. What else can I do to help prevent falls? Wear shoes that: Do not have high heels. Have rubber bottoms. Are comfortable and fit you well. Are closed at the toe. Do not wear sandals. If you use a stepladder: Make sure that it is fully opened. Do not climb a closed stepladder. Make sure that both sides of the stepladder are locked into place. Ask  someone to hold it for you, if possible. Clearly mark and make sure that you can see: Any grab bars or handrails. First and last steps. Where the edge of each step is. Use tools that help you move around (mobility aids) if they are needed. These include: Canes. Walkers. Scooters. Crutches. Turn on the lights when you go into a dark area. Replace any light bulbs as soon as they burn out. Set up your furniture so you have a clear path. Avoid moving your furniture around. If any of your floors are uneven, fix them. If there are any pets around you, be aware of where they are. Review your medicines with your doctor. Some medicines can make you feel dizzy. This can increase your chance of falling. Ask your doctor what other things that you can do to help prevent falls. This information is not intended to replace advice given to you by your health care provider. Make sure you discuss any questions you have with your health care provider. Document Released: 08/06/2009 Document Revised: 03/17/2016 Document Reviewed: 11/14/2014 Elsevier Interactive Patient Education  2017 [employer].

## 2021-12-13 ENCOUNTER — Telehealth: Payer: Self-pay | Admitting: Internal Medicine

## 2021-12-13 NOTE — Telephone Encounter (Signed)
Patient calling in to check on when she stopped the Fosamax. States she needs a dental issue taken care of that requires you be off the medication.   Patient had been told 03/2021 that she could stop taking the Fosamax once she finished her current supply. Patient had request removal during her wellness visit 10/2021 and stated in her note "Patient comments (entered 11/17/2021): Stopped taking the Alendronate 70 MG on October 01, 2021."  Patient informed of this date and verbalized understanding.

## 2022-01-20 DIAGNOSIS — H43813 Vitreous degeneration, bilateral: Secondary | ICD-10-CM | POA: Diagnosis not present

## 2022-02-11 ENCOUNTER — Other Ambulatory Visit: Payer: Self-pay | Admitting: Internal Medicine

## 2022-03-02 ENCOUNTER — Ambulatory Visit: Payer: PRIVATE HEALTH INSURANCE | Admitting: Internal Medicine

## 2022-03-08 ENCOUNTER — Other Ambulatory Visit: Payer: Self-pay | Admitting: *Deleted

## 2022-03-08 DIAGNOSIS — N200 Calculus of kidney: Secondary | ICD-10-CM

## 2022-03-10 ENCOUNTER — Ambulatory Visit
Admission: RE | Admit: 2022-03-10 | Discharge: 2022-03-10 | Disposition: A | Discharge status: Home / Self Care | Payer: Medicare Other | Attending: Urology | Admitting: Urology

## 2022-03-10 ENCOUNTER — Ambulatory Visit
Admission: RE | Admit: 2022-03-10 | Discharge: 2022-03-10 | Disposition: A | Discharge status: Home / Self Care | Payer: Medicare Other | Source: Ambulatory Visit | Attending: Urology | Admitting: Urology

## 2022-03-10 DIAGNOSIS — N200 Calculus of kidney: Secondary | ICD-10-CM

## 2022-03-10 DIAGNOSIS — Z9889 Other specified postprocedural states: Secondary | ICD-10-CM | POA: Diagnosis not present

## 2022-03-11 ENCOUNTER — Ambulatory Visit (INDEPENDENT_AMBULATORY_CARE_PROVIDER_SITE_OTHER): Payer: Medicare Other | Admitting: Urology

## 2022-03-11 ENCOUNTER — Encounter: Payer: Self-pay | Admitting: Urology

## 2022-03-11 VITALS — BP 165/77 | HR 69 | Ht 61.0 in | Wt 120.0 lb

## 2022-03-11 DIAGNOSIS — N200 Calculus of kidney: Secondary | ICD-10-CM | POA: Diagnosis not present

## 2022-03-11 DIAGNOSIS — N3281 Overactive bladder: Secondary | ICD-10-CM | POA: Diagnosis not present

## 2022-03-11 NOTE — Progress Notes (Signed)
03/11/2022 2:08 PM   [name] 04-Jan-1951 [id number]  Referring provider: [name], MD [place],  [place] [id number]  Chief Complaint  Patient presents with   Nephrolithiasis    Urologic history: 1.  Bilateral nephrolithiasis -Shockwave lithotripsy 03/2019 right distal ureteral calculus -Bilateral nonobstructing renal calculi -Stone analysis 85% CaOxMono -Metabolic evaluation low urine volume 1.43 L and hypocitraturia -Taking litholyte supplement  2.  Overactive bladder   HPI: 71 y.o. female presents for semiannual follow-up.  She presents today with her husband  No flank/abdominal pain since last visit Stable lower urinary tract symptoms-on no OAB med Is taking LithoLyte for hypocitraturia.  She tried to enter on MyChart and system entered lithium as the supplement was not on their list.  She has never taken lithium and this has been removed   PMH: Past Medical History:  Diagnosis Date   Adenomatous polyps 2010   colon   Allergy    Arthritis    Chicken pox    Complication of anesthesia    took a while to wake up from lithotripsy   GERD (gastroesophageal reflux disease)    Hyperlipidemia    Hypertension    Kidney stone    Pneumonia     Surgical History: Past Surgical History:  Procedure Laterality Date   CATARACT EXTRACTION W/PHACO Left 05/13/2020   Procedure: CATARACT EXTRACTION PHACO AND INTRAOCULAR LENS PLACEMENT ([place]) LEFT;  Surgeon: Debra Koyanagi, MD;  Location: [place];  Service: Ophthalmology;  Laterality: Left;  6.61 0:53.0 12.4%   CATARACT EXTRACTION W/PHACO Right 06/10/2020   Procedure: CATARACT EXTRACTION PHACO AND INTRAOCULAR LENS PLACEMENT ([place]) RIGHT VIVITY TORIC LENS;  Surgeon: Debra Koyanagi, MD;  Location: [place];  Service: Ophthalmology;  Laterality: Right;  9.04 1:12.8 12.4%   COLONOSCOPY  2017   COLONOSCOPY WITH PROPOFOL N/A 01/05/2021   Procedure: COLONOSCOPY WITH  PROPOFOL;  Surgeon: Debra Lame, MD;  Location: [facility];  Service: Endoscopy;  Laterality: N/A;   EXTRACORPOREAL SHOCK WAVE LITHOTRIPSY Left 03/28/2019   Procedure: EXTRACORPOREAL SHOCK WAVE LITHOTRIPSY (ESWL);  Surgeon: Debra Espy, MD;  Location: [facility];  Service: Urology;  Laterality: Left;   SPINE SURGERY      Home Medications:  Allergies as of 03/11/2022       Reactions   Contrast Media [iodinated Contrast Media] Swelling   Iodine Swelling   Milk-related Compounds Diarrhea   Prednisolone Anxiety   Agitation to oral prednisone tapers    Corn-containing Products    headaches   Penicillins    Childhood does not know reaction   Sulfa Antibiotics Hives        Medication List        Accurate as of Mar 11, 2022  2:08 PM. If you have any questions, ask your nurse or doctor.          STOP taking these medications    alendronate 70 MG tablet Commonly known as: FOSAMAX Stopped by: Abbie Sons, MD   Lithium Carbonate Powd Stopped by: Abbie Sons, MD       TAKE these medications    amLODipine 2.5 MG tablet Commonly known as: NORVASC Take 1 tablet (2.5 mg total) by mouth at bedtime.   Cholecalciferol 100 MCG (4000 UT) Caps Take by mouth.   EPINEPHrine 0.3 mg/0.3 mL Soaj injection Commonly known as: EpiPen 2-Pak Inject 0.3 mLs (0.3 mg total) into the muscle as needed for anaphylaxis.   ezetimibe 10 MG tablet Commonly known  as: ZETIA TAKE 1 TABLET BY MOUTH EVERY DAY   meloxicam 7.5 MG tablet Commonly known as: MOBIC Take 1 tablet (7.5 mg total) by mouth as needed.   mupirocin ointment 2 % Commonly known as: BACTROBAN Place 1 application into the nose 2 (two) times daily. As needed   triamcinolone 55 MCG/ACT Aero nasal inhaler Commonly known as: NASACORT Place 2 sprays into the nose as needed.        Allergies:  Allergies  Allergen Reactions   Contrast Media [Iodinated Contrast Media] Swelling   Iodine Swelling   Milk-Related  Compounds Diarrhea   Prednisolone Anxiety    Agitation to oral prednisone tapers    Corn-Containing Products     headaches   Penicillins     Childhood does not know reaction   Sulfa Antibiotics Hives    Family History: Family History  Problem Relation Age of Onset   Cancer Mother    Stroke Mother    Heart disease Mother    Hypertension Mother    Diabetes Mother    Breast cancer Mother 60   Heart disease Father    Hypertension Father     Social History:  reports that she has never smoked. She has never used smokeless tobacco. She reports current alcohol use of about 3.0 standard drinks per week. She reports that she does not use drugs.   Physical Exam: BP (!) 165/77   Pulse 69   Ht '5\' 1"'$  (1.549 m)   Wt 120 lb (54.4 kg)   BMI 22.67 kg/m   Constitutional:  Alert and oriented, No acute distress. HEENT: [place] AT, moist mucus membranes.  Trachea midline, no masses. Cardiovascular: No clubbing, cyanosis, or edema. Respiratory: Normal respiratory effort, no increased work of breathing.   Pertinent Imaging: Images of a KUB performed yesterday were personally reviewed and interpreted.  Stable bilateral renal calculi.  Largest left lower pole measuring ~ 6 mm    Assessment & Plan:    1.  Bilateral nephrolithiasis Stable Asymptomatic We discussed treatment options for her 6 mm lower pole calculus. SWL could be performed however it is possible the fragments may not completely clear.  We discussed ureteroscopy however this would require a ureteral stent which she may have difficulty tolerating with her overactive bladder.  They have elected to continue surveillance.  2.  Overactive bladder Stable  1 year follow-up with [place], MD  [address] [place] [address], [place], [place] [address] [phone]

## 2022-03-17 ENCOUNTER — Ambulatory Visit
Admission: RE | Admit: 2022-03-17 | Discharge: 2022-03-17 | Disposition: A | Discharge status: Home / Self Care | Payer: Medicare Other | Source: Ambulatory Visit | Attending: Internal Medicine | Admitting: Internal Medicine

## 2022-03-17 DIAGNOSIS — Z1231 Encounter for screening mammogram for malignant neoplasm of breast: Secondary | ICD-10-CM | POA: Insufficient documentation

## 2022-03-18 ENCOUNTER — Telehealth: Payer: PRIVATE HEALTH INSURANCE | Admitting: Internal Medicine

## 2022-03-24 ENCOUNTER — Encounter: Payer: Self-pay | Admitting: Internal Medicine

## 2022-03-24 ENCOUNTER — Other Ambulatory Visit (INDEPENDENT_AMBULATORY_CARE_PROVIDER_SITE_OTHER): Payer: Medicare Other

## 2022-03-24 ENCOUNTER — Telehealth (INDEPENDENT_AMBULATORY_CARE_PROVIDER_SITE_OTHER): Payer: Medicare Other | Admitting: Internal Medicine

## 2022-03-24 VITALS — Ht 61.0 in | Wt 120.0 lb

## 2022-03-24 DIAGNOSIS — Z780 Asymptomatic menopausal state: Secondary | ICD-10-CM | POA: Diagnosis not present

## 2022-03-24 DIAGNOSIS — I10 Essential (primary) hypertension: Secondary | ICD-10-CM

## 2022-03-24 DIAGNOSIS — R5383 Other fatigue: Secondary | ICD-10-CM

## 2022-03-24 DIAGNOSIS — N3281 Overactive bladder: Secondary | ICD-10-CM | POA: Diagnosis not present

## 2022-03-24 DIAGNOSIS — E559 Vitamin D deficiency, unspecified: Secondary | ICD-10-CM

## 2022-03-24 DIAGNOSIS — E785 Hyperlipidemia, unspecified: Secondary | ICD-10-CM

## 2022-03-24 DIAGNOSIS — R7301 Impaired fasting glucose: Secondary | ICD-10-CM | POA: Diagnosis not present

## 2022-03-24 DIAGNOSIS — M8580 Other specified disorders of bone density and structure, unspecified site: Secondary | ICD-10-CM

## 2022-03-24 DIAGNOSIS — Z803 Family history of malignant neoplasm of breast: Secondary | ICD-10-CM | POA: Diagnosis not present

## 2022-03-24 DIAGNOSIS — Z86010 Personal history of colonic polyps: Secondary | ICD-10-CM | POA: Diagnosis not present

## 2022-03-24 DIAGNOSIS — N200 Calculus of kidney: Secondary | ICD-10-CM

## 2022-03-24 LAB — COMPREHENSIVE METABOLIC PANEL
ALT: 18 U/L (ref 0–35)
AST: 23 U/L (ref 0–37)
Albumin: 4.3 g/dL (ref 3.5–5.2)
Alkaline Phosphatase: 54 U/L (ref 39–117)
BUN: 16 mg/dL (ref 6–23)
CO2: 28 mEq/L (ref 19–32)
Calcium: 9.3 mg/dL (ref 8.4–10.5)
Chloride: 105 mEq/L (ref 96–112)
Creatinine, Ser: 0.77 mg/dL (ref 0.40–1.20)
GFR: 76.85 mL/min (ref >60.00)
Glucose, Bld: 86 mg/dL (ref 70–99)
Potassium: 4.2 mEq/L (ref 3.5–5.1)
Sodium: 142 mEq/L (ref 135–145)
Total Bilirubin: 0.7 mg/dL (ref 0.2–1.2)
Total Protein: 6.5 g/dL (ref 6.0–8.3)

## 2022-03-24 LAB — CBC WITH DIFFERENTIAL/PLATELET
Basophils Absolute: 0 10*3/uL (ref 0.0–0.1)
Basophils Relative: 0.7 % (ref 0.0–3.0)
Eosinophils Absolute: 0.1 10*3/uL (ref 0.0–0.7)
Eosinophils Relative: 1.6 % (ref 0.0–5.0)
HCT: 41.9 % (ref 36.0–46.0)
Hemoglobin: 14 g/dL (ref 12.0–15.0)
Lymphocytes Relative: 24.9 % (ref 12.0–46.0)
Lymphs Abs: 0.9 10*3/uL (ref 0.7–4.0)
MCHC: 33.4 g/dL (ref 30.0–36.0)
MCV: 93.8 fl (ref 78.0–100.0)
Monocytes Absolute: 0.4 10*3/uL (ref 0.1–1.0)
Monocytes Relative: 10 % (ref 3.0–12.0)
Neutro Abs: 2.4 10*3/uL (ref 1.4–7.7)
Neutrophils Relative %: 62.8 % (ref 43.0–77.0)
Platelets: 172 10*3/uL (ref 150.0–400.0)
RBC: 4.46 Mil/uL (ref 3.87–5.11)
RDW: 13.2 % (ref 11.5–15.5)
WBC: 3.8 10*3/uL — ABNORMAL LOW (ref 4.0–10.5)

## 2022-03-24 LAB — LIPID PANEL
Cholesterol: 217 mg/dL — ABNORMAL HIGH (ref 0–200)
HDL: 67.5 mg/dL (ref >39.00)
LDL Cholesterol: 132 mg/dL — ABNORMAL HIGH (ref 0–99)
NonHDL: 149.69
Total CHOL/HDL Ratio: 3
Triglycerides: 90 mg/dL (ref 0.0–149.0)
VLDL: 18 mg/dL (ref 0.0–40.0)

## 2022-03-24 LAB — MICROALBUMIN / CREATININE URINE RATIO
Creatinine,U: 156.1 mg/dL
Microalb Creat Ratio: 1 mg/g (ref 0.0–30.0)
Microalb, Ur: 1.6 mg/dL (ref 0.0–1.9)

## 2022-03-24 LAB — VITAMIN D 25 HYDROXY (VIT D DEFICIENCY, FRACTURES): VITD: 58.64 ng/mL (ref 30.00–100.00)

## 2022-03-24 LAB — TSH: TSH: 3.32 u[IU]/mL (ref 0.35–5.50)

## 2022-03-24 MED ORDER — DOXYCYCLINE MONOHYDRATE 100 MG PO CAPS: 100.0000 mg | ORAL_CAPSULE | Freq: Two times a day (BID) | ORAL | 1 refills | Status: DC

## 2022-03-24 NOTE — Assessment & Plan Note (Signed)
She has been on an alendronate holiday since Nov 2022.  Will repeat DEXA at Nov 2022

## 2022-03-24 NOTE — Assessment & Plan Note (Signed)
Size of largest stone on left is 6 mm unchanged per urology follow up May 2023.  Continue using litholyte

## 2022-03-24 NOTE — Assessment & Plan Note (Signed)
Managed with fluid restriction

## 2022-03-24 NOTE — Assessment & Plan Note (Signed)
Last colonoscopy was done in 2022 and 5 yr follow up is recommended.

## 2022-03-24 NOTE — Assessment & Plan Note (Signed)
Annual mammogram was normal Mar 15 2022

## 2022-03-24 NOTE — Progress Notes (Unsigned)
Virtual Visit via [place] Note  This visit type was conducted due to national recommendations for restrictions regarding the COVID-19 pandemic (e.g. social distancing).  This format is felt to be most appropriate for this patient at this time.  All issues noted in this document were discussed and addressed.  No physical exam was performed (except for noted visual exam findings with Video Visits).   I connected withNAME@ on 03/24/22 at  9:30 AM EDT by a video enabled telemedicine application and verified that I am speaking with the correct person using two identifiers. Location patient: home Location provider: work or home office Persons participating in the virtual visit: patient, provider  I discussed the limitations, risks, security and privacy concerns of performing an evaluation and management service by telephone and the availability of in person appointments. I also discussed with the patient that there may be a patient responsible charge related to this service. The patient expressed understanding and agreed to proceed.   Reason for visit: follow u p on hypertension hyperlipidemia  HPI:  71 yr old female with history of bilateral non obstructing renal calculi,  history of lithotripsy.  Hypertension hyperlipidemia and osteopenia  treated with with alendronate stopped Nov 2022  Husband Ed no longer on chemo for treatment of  metastatic pancreatic CA.  His personality has returned to baseline with a few episodes of abnormal behavior due to cognitive changes resulting in some unwise financial decisions .  He has declined follow up with me since he is following up with [facility] and [facility]   Patient had to wait a   5 month washout period of alendronate use to have a tooth pulled and a crown replaced on the right side , oral surgery ; done on May 19. With a bone graft.  Son is a Pharmacist, community and thinks the bone graft is looking good.  She is off of pain meds  using an oral rinse ([place]) three  times daily and taking vitamin D 5000 Ius daily (recommended by [facility])  Sleeping better due to less frequent urination which she attributes to use of levaquin and azithromycin perioperatively.  Right maxillary sinus feels better since the tooth was removed.   Using litholyte up to tid to prevent more stone formation , and limiting liquid intake after 7 pm.  In bed by 8:30, most nights able to sleep until 4:30 to void   Cc: found a tick bite on right leg  found in the shower this  morning.  No rash ,  no redness,  no headache.  History of  Ehrlichiosis . Doxycycline refilled.   HTN:  taking amlodipine 2.5 mg not checking at home.   Last reading was high pre oral surgery.    HLD:    treated with Zetia due to intolerance to statins.   ROS: See pertinent positives and negatives per HPI.  Past Medical History:  Diagnosis Date   Adenomatous polyps 2010   colon   Allergy    Arthritis    Chicken pox    Complication of anesthesia    took a while to wake up from lithotripsy   GERD (gastroesophageal reflux disease)    Hyperlipidemia    Hypertension    Kidney stone    Lymphadenopathy, axillary 02/25/2020   Pneumonia     Past Surgical History:  Procedure Laterality Date   CATARACT EXTRACTION W/PHACO Left 05/13/2020   Procedure: CATARACT EXTRACTION PHACO AND INTRAOCULAR LENS PLACEMENT ([place]) LEFT;  Surgeon: [name], MD;  Location:  [place];  Service: Ophthalmology;  Laterality: Left;  6.61 0:53.0 12.4%   CATARACT EXTRACTION W/PHACO Right 06/10/2020   Procedure: CATARACT EXTRACTION PHACO AND INTRAOCULAR LENS PLACEMENT ([place]) RIGHT VIVITY TORIC LENS;  Surgeon: [name], MD;  Location: [place];  Service: Ophthalmology;  Laterality: Right;  9.04 1:12.8 12.4%   COLONOSCOPY  2017   COLONOSCOPY WITH PROPOFOL N/A 01/05/2021   Procedure: COLONOSCOPY WITH PROPOFOL;  Surgeon: [name], MD;  Location: [facility]COPY;  Service: Endoscopy;  Laterality: N/A;    EXTRACORPOREAL SHOCK WAVE LITHOTRIPSY Left 03/28/2019   Procedure: EXTRACORPOREAL SHOCK WAVE LITHOTRIPSY (ESWL);  Surgeon: [name], MD;  Location: [facility];  Service: Urology;  Laterality: Left;   SPINE SURGERY      Family History  Problem Relation Age of Onset   Cancer Mother    Stroke Mother    Heart disease Mother    Hypertension Mother    Diabetes Mother    Breast cancer Mother 31   Heart disease Father    Hypertension Father     SOCIAL HX:  reports that she has never smoked. She has never used smokeless tobacco. She reports current alcohol use of about 3.0 standard drinks per week. She reports that she does not use drugs.   Current Outpatient Medications:    amLODipine (NORVASC) 2.5 MG tablet, Take 1 tablet (2.5 mg total) by mouth at bedtime., Disp: 90 tablet, Rfl: 1   doxycycline (MONODOX) 100 MG capsule, Take 1 capsule (100 mg total) by mouth 2 (two) times daily., Disp: 20 capsule, Rfl: 1   EPINEPHrine (EPIPEN 2-PAK) 0.3 mg/0.3 mL IJ SOAJ injection, Inject 0.3 mLs (0.3 mg total) into the muscle as needed for anaphylaxis., Disp: 1 each, Rfl: 1   ezetimibe (ZETIA) 10 MG tablet, TAKE 1 TABLET BY MOUTH EVERY DAY, Disp: 90 tablet, Rfl: 1   triamcinolone (NASACORT) 55 MCG/ACT AERO nasal inhaler, Place 2 sprays into the nose as needed., Disp: , Rfl:    VITAMIN D, CHOLECALCIFEROL, PO, Take 5,000 Units by mouth daily., Disp: , Rfl:    meloxicam (MOBIC) 7.5 MG tablet, Take 1 tablet (7.5 mg total) by mouth as needed. (Patient not taking: Reported on 03/24/2022), Disp: 90 tablet, Rfl: 1  EXAM:  VITALS per patient if applicable:  GENERAL: alert, oriented, appears well and in no acute distress  HEENT: atraumatic, conjunttiva clear, no obvious abnormalities on inspection of external nose and ears  NECK: normal movements of the head and neck  LUNGS: on inspection no signs of respiratory distress, breathing rate appears normal, no obvious gross SOB, gasping or wheezing  CV: no  obvious cyanosis  MS: moves all visible extremities without noticeable abnormality  PSYCH/NEURO: pleasant and cooperative, no obvious depression or anxiety, speech and thought processing grossly intact  ASSESSMENT AND PLAN:  Discussed the following assessment and plan:  Essential hypertension, benign - Plan: Comp Met (CMET), Urine Microalbumin w/creat. ratio  Hyperlipidemia with target LDL less than 130 - Plan: Lipid Profile  Fatigue, unspecified type - Plan: TSH, CBC with Differential/Platelet  Impaired fasting glucose  Vitamin D deficiency - Plan: VITAMIN D 25 Hydroxy (Vit-D Deficiency, Fractures)  Osteopenia after menopause  Bilateral nephrolithiasis - Plan: Ambulatory referral to Urology  Family history of breast cancer  Overactive bladder  History of colonic polyps  Caregiver with fatigue  Osteopenia after menopause She has been on an alendronate holiday since Nov 2022.  Will repeat DEXA at Nov 2022   Bilateral nephrolithiasis Currently managed with survillance  and litholyte per Dr Marcell Barlow of largest stone on left is 6 mm unchanged per urology follow up May 2023.  Continue using litholyte .  She hs requested a second opinion on whether to have the largest stone lithootripsied, referral to [facility] made  Family history of breast cancer Annual mammogram was normal Mar 15 2022   Hyperlipidemia with target LDL less than 130 Managed with Zetia due to memory loss noted with statins. She has no known avidence of aortic atherosclerosis or CAD to warrant more aggressive treatment which would require PCSK9 inhibitor .  No changes to regimen   Lab Results  Component Value Date   CHOL 217 (H) 03/24/2022   HDL 67.50 03/24/2022   LDLCALC 132 (H) 03/24/2022   LDLDIRECT 173.0 06/06/2018   TRIG 90.0 03/24/2022   CHOLHDL 3 03/24/2022     Overactive bladder Managed with fluid restriction   History of colonic polyps Last colonoscopy was done in 2022 and 5 yr  follow up is recommended.   Caregiver with fatigue She is managing the stress of Ed's diagnosis and prognosis well.   Essential hypertension, benign encouraged to check BP at home given use of virtual visits and recent report of elevated readings at oral surgeon's office     I discussed the assessment and treatment plan with the patient. The patient was provided an opportunity to ask questions and all were answered. The patient agreed with the plan and demonstrated an understanding of the instructions.   The patient was advised to call back or seek an in-person evaluation if the symptoms worsen or if the condition fails to improve as anticipated.   I spent 40 minutes dedicated to the care of this patient on the date of this encounter to include pre-visit review of her medical history,  last imaging study and urology follow up,  last DEXA scan ,  Face-to-face time with the patient , and post visit ordering of testing and therapeutics.    [name], MD

## 2022-03-24 NOTE — Assessment & Plan Note (Signed)
Managed with Zetia dur to memory loss noted with statins.

## 2022-03-24 NOTE — Patient Instructions (Signed)
You  have been above that on the last 3-4 office visits  .  Please check your blood pressure daily for 7 days at home and send me the readings so I can determine if you need to change  your dose of amlodipine  to lower your blood pressure .

## 2022-03-25 ENCOUNTER — Telehealth: Payer: Self-pay

## 2022-03-25 NOTE — Telephone Encounter (Signed)
Hughie Closs, Wyona Almas, CMA Called pt to schedule follow up appt.... Pt stated that she advised Dr. Derrel Nip about getting a 2nd opinion for urology... Pt state that our office called her to schedule the appt with the same urology doctor that she is requesting a 2nd opinion on... Advised pt that I will send a message back... Pt/Pt husband change there mind about seeing urology.Marland KitchenMarland Kitchen

## 2022-03-26 NOTE — Assessment & Plan Note (Signed)
She is managing the stress of Ed's diagnosis and prognosis well.

## 2022-03-26 NOTE — Assessment & Plan Note (Signed)
encouraged to check BP at home given use of virtual visits and recent report of elevated readings at oral surgeon's office

## 2022-04-02 ENCOUNTER — Encounter: Payer: Self-pay | Admitting: Internal Medicine

## 2022-04-02 DIAGNOSIS — L989 Disorder of the skin and subcutaneous tissue, unspecified: Secondary | ICD-10-CM

## 2022-04-04 NOTE — Telephone Encounter (Signed)
Referral pended for approval

## 2022-04-25 ENCOUNTER — Telehealth: Payer: Self-pay | Admitting: Internal Medicine

## 2022-04-25 ENCOUNTER — Other Ambulatory Visit: Payer: Self-pay | Admitting: Internal Medicine

## 2022-04-25 MED ORDER — LEVOFLOXACIN 500 MG PO TABS: 500.0000 mg | ORAL_TABLET | Freq: Every day | ORAL | 0 refills | Status: AC

## 2022-04-25 NOTE — Telephone Encounter (Signed)
Asking for a call back in reference to his wife. He thinks she has a UTI.

## 2022-04-25 NOTE — Telephone Encounter (Signed)
Spoke with pt's husband, he stated that they are out of town in [place] and his wife has developed symptoms of a UTI. Husband stated that she is having lower pelvic cramping like pain and hematuria. They scheduled an appt with urgent care there in [place] for today at 4:30 pm to run a dipstick and hopefully prescribe her some levaquin.

## 2022-04-27 ENCOUNTER — Ambulatory Visit
Admission: RE | Admit: 2022-04-27 | Discharge: 2022-04-27 | Disposition: A | Discharge status: Home / Self Care | Payer: Medicare Other | Source: Home / Self Care | Attending: Urology | Admitting: Urology

## 2022-04-27 ENCOUNTER — Emergency Department: Payer: Medicare Other

## 2022-04-27 ENCOUNTER — Other Ambulatory Visit: Payer: Self-pay

## 2022-04-27 ENCOUNTER — Ambulatory Visit (INDEPENDENT_AMBULATORY_CARE_PROVIDER_SITE_OTHER): Payer: Medicare Other | Admitting: Urology

## 2022-04-27 ENCOUNTER — Encounter: Payer: Self-pay | Admitting: Urology

## 2022-04-27 ENCOUNTER — Emergency Department
Admission: EM | Admit: 2022-04-27 | Discharge: 2022-04-27 | Disposition: A | Discharge status: Home / Self Care | Payer: Medicare Other | Attending: Emergency Medicine | Admitting: Emergency Medicine

## 2022-04-27 ENCOUNTER — Ambulatory Visit
Admission: RE | Admit: 2022-04-27 | Discharge: 2022-04-27 | Disposition: A | Discharge status: Home / Self Care | Payer: Medicare Other | Source: Ambulatory Visit | Attending: Urology | Admitting: Urology

## 2022-04-27 VITALS — BP 148/78 | HR 71 | Ht 61.0 in | Wt 126.0 lb

## 2022-04-27 DIAGNOSIS — N200 Calculus of kidney: Secondary | ICD-10-CM

## 2022-04-27 DIAGNOSIS — R109 Unspecified abdominal pain: Secondary | ICD-10-CM | POA: Diagnosis not present

## 2022-04-27 DIAGNOSIS — I700 Atherosclerosis of aorta: Secondary | ICD-10-CM | POA: Diagnosis not present

## 2022-04-27 DIAGNOSIS — I10 Essential (primary) hypertension: Secondary | ICD-10-CM | POA: Insufficient documentation

## 2022-04-27 DIAGNOSIS — N132 Hydronephrosis with renal and ureteral calculous obstruction: Secondary | ICD-10-CM | POA: Diagnosis not present

## 2022-04-27 DIAGNOSIS — Z981 Arthrodesis status: Secondary | ICD-10-CM | POA: Diagnosis not present

## 2022-04-27 DIAGNOSIS — M4317 Spondylolisthesis, lumbosacral region: Secondary | ICD-10-CM | POA: Diagnosis not present

## 2022-04-27 LAB — CBC
HCT: 40.4 % (ref 36.0–46.0)
Hemoglobin: 13.2 g/dL (ref 12.0–15.0)
MCH: 30.9 pg (ref 26.0–34.0)
MCHC: 32.7 g/dL (ref 30.0–36.0)
MCV: 94.6 fL (ref 80.0–100.0)
Platelets: 174 10*3/uL (ref 150–400)
RBC: 4.27 MIL/uL (ref 3.87–5.11)
RDW: 12.9 % (ref 11.5–15.5)
WBC: 5.1 10*3/uL (ref 4.0–10.5)
nRBC: 0 % (ref 0.0–0.2)

## 2022-04-27 LAB — BASIC METABOLIC PANEL
Anion gap: 6 (ref 5–15)
BUN: 17 mg/dL (ref 8–23)
CO2: 25 mmol/L (ref 22–32)
Calcium: 9.2 mg/dL (ref 8.9–10.3)
Chloride: 109 mmol/L (ref 98–111)
Creatinine, Ser: 0.77 mg/dL (ref 0.44–1.00)
GFR, Estimated: >60 mL/min (ref >60)
Glucose, Bld: 114 mg/dL — ABNORMAL HIGH (ref 70–99)
Potassium: 4.1 mmol/L (ref 3.5–5.1)
Sodium: 140 mmol/L (ref 135–145)

## 2022-04-27 LAB — URINALYSIS, ROUTINE W REFLEX MICROSCOPIC
Bacteria, UA: NONE SEEN
Bilirubin Urine: NEGATIVE
Glucose, UA: NEGATIVE mg/dL
Ketones, ur: NEGATIVE mg/dL
Leukocytes,Ua: NEGATIVE
Nitrite: NEGATIVE
Protein, ur: NEGATIVE mg/dL
Specific Gravity, Urine: 1.015 (ref 1.005–1.030)
pH: 7 (ref 5.0–8.0)

## 2022-04-27 NOTE — Telephone Encounter (Signed)
Pt husband called stating the pt is in the ER and she need a ct of her abdomen and pelvis

## 2022-04-27 NOTE — Telephone Encounter (Signed)
Patient husband DPR stated patient has passed the stone and that after speaking with Dr. Bernardo Heater That they are not proceeding with lithotripsy, but much appreciated your follow up and concern. No pain medication needed at this time but will call back if needed.

## 2022-04-27 NOTE — ED Provider Notes (Signed)
[facility] Provider Note    Event Date/Time   First MD Initiated Contact with Patient 04/27/22 [phone]     (approximate)   History   Chief Complaint Flank Pain   HPI  [name] is a 71 y.o. female with past medical history of hypertension, hyperlipidemia, polyarthralgia, and kidney stones who presents to the ED complaining of flank pain.  Patient reports that for the past 2 days she has been dealing with intermittent pain in her right flank radiating down towards her groin.  She describes the pain as sharp and severe at times, but not exacerbated or alleviated by anything in particular.  She has been feeling nauseous when the pain is severe but she denies any vomiting or diarrhea.  She has dealt with intermittent dysuria for about the past week, notified her PCP that she was having the symptoms and was started on Levaquin for possible UTI.  She has taken 2 days of Levaquin with improvement in dysuria, denies any associated fevers.     Physical Exam   Triage Vital Signs: ED Triage Vitals  Enc Vitals Group     BP 04/27/22 0719 (!) 144/64     Pulse Rate 04/27/22 0719 63     Resp 04/27/22 0719 16     Temp 04/27/22 0719 97.6 F (36.4 C)     Temp Source 04/27/22 0719 Oral     SpO2 04/27/22 0719 98 %     Weight 04/27/22 0720 126 lb (57.2 kg)     Height 04/27/22 0720 '5\' 1"'$  (1.549 m)     Head Circumference --      Peak Flow --      Pain Score 04/27/22 0720 3     Pain Loc --      Pain Edu? --      Excl. in Early? --     Most recent vital signs: Vitals:   04/27/22 0719  BP: (!) 144/64  Pulse: 63  Resp: 16  Temp: 97.6 F (36.4 C)  SpO2: 98%    Constitutional: Alert and oriented. Eyes: Conjunctivae are normal. Head: Atraumatic. Nose: No congestion/rhinnorhea. Mouth/Throat: Mucous membranes are moist.  Cardiovascular: Normal rate, regular rhythm. Grossly normal heart sounds.  2+ radial pulses bilaterally. Respiratory: Normal respiratory effort.   No retractions. Lungs CTAB. Gastrointestinal: Soft and nontender.  No CVA tenderness bilaterally.  No distention. Musculoskeletal: No lower extremity tenderness nor edema.  Neurologic:  Normal speech and language. No gross focal neurologic deficits are appreciated.    ED Results / Procedures / Treatments   Labs (all labs ordered are listed, but only abnormal results are displayed) Labs Reviewed  URINALYSIS, ROUTINE W REFLEX MICROSCOPIC - Abnormal; Notable for the following components:      Result Value   Color, Urine YELLOW (*)    APPearance CLEAR (*)    Hgb urine dipstick SMALL (*)    All other components within normal limits  BASIC METABOLIC PANEL - Abnormal; Notable for the following components:   Glucose, Bld 114 (*)    All other components within normal limits  CBC   RADIOLOGY CT renal reviewed and interpreted by me with obstructing stone at the right UVJ and associated hydronephrosis.  PROCEDURES:  Critical Care performed: No  Procedures   MEDICATIONS ORDERED IN ED: Medications - No data to display   IMPRESSION / MDM / Grandfather / ED COURSE  I reviewed the triage vital signs and the nursing notes.  71 y.o. female with past medical history of hypertension, hyperlipidemia, polyarthralgias, and kidney stones who presents to the ED complaining of 2 days of intermittent right flank pain radiating towards her groin.  Patient's presentation is most consistent with acute presentation with potential threat to life or bodily function.  Differential diagnosis includes, but is not limited to, kidney stone, pyelonephritis, appendicitis, cholecystitis, biliary colic, electrolyte abnormality, AKI.  Patient well-appearing and in no acute distress, vital signs are unremarkable and she has a benign abdominal exam.  CT scan performed from triage is remarkable for distal right ureteral stone measured at 4 mm, which seems to be the likely source  of patient's pain.  No associated infection noted on urinalysis and patient now seems to have passed the stone here in the ED.  She denies any symptoms at this time after passing the stone, remainder of labs are reassuring with no significant anemia, leukocytosis, AKI, or electrolyte abnormality.  Given reassuring work-up, she is appropriate for discharge home with PCP and urology follow-up, was counseled to continue course of Levaquin and to return to the ED for new or worsening symptoms.  She declines any pain or nausea medication, patient agrees with plan.      FINAL CLINICAL IMPRESSION(S) / ED DIAGNOSES   Final diagnoses:  Right flank pain  Kidney stone     Rx / DC Orders   ED Discharge Orders     None        Note:  This document was prepared using Dragon voice recognition software and may include unintentional dictation errors.   Blake Divine, MD 04/27/22 1056

## 2022-04-27 NOTE — Telephone Encounter (Signed)
The ER ordered the CT for Renal stone. FYI patient in ED husband called wanting a CT ordered but this was done at the ER. I have copied the impression below.  IMPRESSION: 1. Obstructing 4 mm calculus at the right ureterovesical junction. 2. Additional nonobstructing bilateral renal calculi. 3. Minimal Aortic Atherosclerosis (ICD10-I70.0).

## 2022-04-27 NOTE — ED Triage Notes (Signed)
Pt c/o right flank/lower back pain since around 2:30am. States she has a hx of kidney stones and this feels similar.

## 2022-04-27 NOTE — ED Notes (Signed)
27 yof with a c/c of right sided flank pain since 2:30 am this morning. Pt does have some pain present especially after urinating.

## 2022-04-27 NOTE — Progress Notes (Signed)
04/27/2022 1:33 PM   [name] December 04, 1950 [id number]  Referring provider: [name], MD [place],  [place] [address]  Urological history: 1.  Nephrolithiasis -Shockwave lithotripsy 03/2019 right distal ureteral calculus -Bilateral nonobstructing renal calculi -Stone analysis 85% CaOxMono -Metabolic evaluation low urine volume 1.43 L and hypocitraturia  2. OAB -Contributing factors of age, vaginal atrophy and hypertension  Chief Complaint  Patient presents with   Nephrolithiasis    HPI: [name] is a 71 y.o. female who was seen in the emergency room earlier this morning for right-sided flank pain.  CT renal stone study noted a obstructing 4 mm calculus at the right UVJ.  WBC count of 5.1, serum creatinine 0.77 and UA with microscopic hematuria in the ED this morning.  She presented to the office this afternoon with stone in hand.  She states she passed it after having the CT scan.  She has not had any further pain or discomfort since the passage of the stone.  She is interested in having her renal stones treated in the future, but she needs to find out her husband's chemotherapy schedule and will let us know.  Patient denies any modifying or aggravating factors.  Patient denies any gross hematuria, dysuria or suprapubic/flank pain.  Patient denies any fevers, chills, nausea or vomiting.    PMH: Past Medical History:  Diagnosis Date   Adenomatous polyps 2010   colon   Allergy    Arthritis    Chicken pox    Complication of anesthesia    took a while to wake up from lithotripsy   GERD (gastroesophageal reflux disease)    Hyperlipidemia    Hypertension    Kidney stone    Lymphadenopathy, axillary 02/25/2020   Pneumonia     Surgical History: Past Surgical History:  Procedure Laterality Date   CATARACT EXTRACTION W/PHACO Left 05/13/2020   Procedure: CATARACT EXTRACTION PHACO AND INTRAOCULAR LENS PLACEMENT ([place]) LEFT;  Surgeon:  [name], MD;  Location: [place] [facility];  Service: Ophthalmology;  Laterality: Left;  6.61 0:53.0 12.4%   CATARACT EXTRACTION W/PHACO Right 06/10/2020   Procedure: CATARACT EXTRACTION PHACO AND INTRAOCULAR LENS PLACEMENT ([place]) RIGHT VIVITY TORIC LENS;  Surgeon: [name], MD;  Location: [facility];  Service: Ophthalmology;  Laterality: Right;  9.04 1:12.8 12.4%   COLONOSCOPY  2017   COLONOSCOPY WITH PROPOFOL N/A 01/05/2021   Procedure: COLONOSCOPY WITH PROPOFOL;  Surgeon: [name], MD;  [facility] ENDOSCOPY;  Service: Endoscopy;  Laterality: N/A;   EXTRACORPOREAL SHOCK WAVE LITHOTRIPSY Left 03/28/2019   Procedure: EXTRACORPOREAL SHOCK WAVE LITHOTRIPSY (ESWL);  Surgeon: [name], MD;  Location: [facility];  Service: Urology;  Laterality: Left;   SPINE SURGERY      Home Medications:  Allergies as of 04/27/2022       Reactions   Contrast Media [iodinated Contrast Media] Swelling   Iodine Swelling   Milk-related Compounds Diarrhea   Prednisolone Anxiety   Agitation to oral prednisone tapers    Corn-containing Products    headaches   Penicillins    Childhood does not know reaction   Sulfa Antibiotics Hives        Medication List        Accurate as of April 27, 2022  1:33 PM. If you have any questions, ask your nurse or doctor.          STOP taking these medications    meloxicam 7.5 MG tablet Commonly known as:  MOBIC Stopped by: Zara Council, PA-C       TAKE these medications    amLODipine 2.5 MG tablet Commonly known as: NORVASC Take 1 tablet (2.5 mg total) by mouth at bedtime.   doxycycline 100 MG capsule Commonly known as: MONODOX Take 1 capsule (100 mg total) by mouth 2 (two) times daily.   EPINEPHrine 0.3 mg/0.3 mL Soaj injection Commonly known as: EpiPen 2-Pak Inject 0.3 mLs (0.3 mg total) into the muscle as needed for anaphylaxis.   ezetimibe 10 MG tablet Commonly known as: ZETIA TAKE 1 TABLET BY MOUTH  EVERY DAY   levofloxacin 500 MG tablet Commonly known as: LEVAQUIN Take 1 tablet (500 mg total) by mouth daily for 7 days.   triamcinolone 55 MCG/ACT Aero nasal inhaler Commonly known as: NASACORT Place 2 sprays into the nose as needed.   VITAMIN D (CHOLECALCIFEROL) PO Take 5,000 Units by mouth daily.        Allergies:  Allergies  Allergen Reactions   Contrast Media [Iodinated Contrast Media] Swelling   Iodine Swelling   Milk-Related Compounds Diarrhea   Prednisolone Anxiety    Agitation to oral prednisone tapers    Corn-Containing Products     headaches   Penicillins     Childhood does not know reaction   Sulfa Antibiotics Hives    Family History: Family History  Problem Relation Age of Onset   Cancer Mother    Stroke Mother    Heart disease Mother    Hypertension Mother    Diabetes Mother    Breast cancer Mother 74   Heart disease Father    Hypertension Father     Social History:  reports that she has never smoked. She has never used smokeless tobacco. She reports current alcohol use of about 3.0 standard drinks of alcohol per week. She reports that she does not use drugs.  ROS: Pertinent ROS in HPI  Physical Exam: BP (!) 148/78   Pulse 71   Ht '5\' 1"'$  (1.549 m)   Wt 126 lb (57.2 kg)   BMI 23.81 kg/m   Constitutional:  Well nourished. Alert and oriented, No acute distress. HEENT: [place] AT, moist mucus membranes.  Trachea midline Cardiovascular: No clubbing, cyanosis, or edema. Respiratory: Normal respiratory effort, no increased work of breathing. Neurologic: Grossly intact, no focal deficits, moving all 4 extremities. Psychiatric: Normal mood and affect.  Laboratory Data: Lab Results  Component Value Date   WBC 5.1 04/27/2022   HGB 13.2 04/27/2022   HCT 40.4 04/27/2022   MCV 94.6 04/27/2022   PLT 174 04/27/2022    Lab Results  Component Value Date   CREATININE 0.77 04/27/2022    Lab Results  Component Value Date   TSH 3.32 03/24/2022        Component Value Date/Time   CHOL 217 (H) 03/24/2022 1057   HDL 67.50 03/24/2022 1057   CHOLHDL 3 03/24/2022 1057   VLDL 18.0 03/24/2022 1057   LDLCALC 132 (H) 03/24/2022 1057    Lab Results  Component Value Date   AST 23 03/24/2022   Lab Results  Component Value Date   ALT 18 03/24/2022    Urinalysis    Component Value Date/Time   COLORURINE YELLOW (A) 04/27/2022 0723   APPEARANCEUR CLEAR (A) 04/27/2022 0723   LABSPEC 1.015 04/27/2022 0723   PHURINE 7.0 04/27/2022 0723   GLUCOSEU NEGATIVE 04/27/2022 0723   GLUCOSEU NEGATIVE 04/28/2021 1358   HGBUR SMALL (A) 04/27/2022 0723   BILIRUBINUR NEGATIVE 04/27/2022 1017  BILIRUBINUR Negative 08/10/2015 [place] 04/27/2022 0723   PROTEINUR NEGATIVE 04/27/2022 0723   UROBILINOGEN 0.2 04/28/2021 1358   NITRITE NEGATIVE 04/27/2022 0723   LEUKOCYTESUR NEGATIVE 04/27/2022 0723  I have reviewed the labs.   Pertinent Imaging: Stool and gas overlying the right renal shadow making it difficult to identify the right lower pole stones, but the left lower pole stones are readily visible on today's KUB.  Some pelvic phleboliths are noted on today's KUB, but no radiopaque calcifications are seen in the region of the UVJ I have independently reviewed the films.    Assessment & Plan:    1.  Nephrolithiasis -Patient will contact us when she is aware of her husband's chemotherapy schedule as she wants to address her renal stones in the upcoming future -I explained that I would not pursue ESWL tomorrow as she is still in acute phase of passing the stone and may still have residual fragments in the right ureter, edema or blood products that may cause hydro  Return for Patient to call to schedule left ESWL the future.  These notes generated with voice recognition software. I apologize for typographical errors.  Zara Council, PA-C  [facility] [address]  [place], [place]  [address] [phone]

## 2022-05-03 LAB — CALCULI, WITH PHOTOGRAPH (CLINICAL LAB)
Calcium Oxalate Monohydrate: 100 %
Weight Calculi: 12 mg

## 2022-05-10 ENCOUNTER — Telehealth: Payer: Self-pay

## 2022-05-10 NOTE — Telephone Encounter (Signed)
Notified pt as advised. Pt states she did get her husbands chemotherapy schedule however she's declining ESWL at this time. She states that she will call back if she changes her mind.

## 2022-05-10 NOTE — Telephone Encounter (Signed)
-----   Message from Nori Riis, PA-C sent at 05/08/2022  1:11 PM EDT ----- Please let [name] know that her stone analysis revealed that she made a calcium oxalate stone.  Was she able to get her husband's treatment schedule for his chemotherapy so she can schedule ESWL?

## 2022-06-13 ENCOUNTER — Encounter: Payer: Self-pay | Admitting: Internal Medicine

## 2022-06-13 ENCOUNTER — Other Ambulatory Visit: Payer: Self-pay

## 2022-06-13 MED ORDER — EZETIMIBE 10 MG PO TABS: 10.0000 mg | ORAL_TABLET | Freq: Every day | ORAL | 1 refills | Status: DC

## 2022-07-21 ENCOUNTER — Encounter: Payer: Self-pay | Admitting: Internal Medicine

## 2022-08-01 DIAGNOSIS — Z23 Encounter for immunization: Secondary | ICD-10-CM | POA: Diagnosis not present

## 2022-08-04 ENCOUNTER — Other Ambulatory Visit: Payer: Self-pay | Admitting: Internal Medicine

## 2022-08-15 DIAGNOSIS — Z23 Encounter for immunization: Secondary | ICD-10-CM | POA: Diagnosis not present

## 2022-09-23 ENCOUNTER — Other Ambulatory Visit: Payer: Self-pay | Admitting: Internal Medicine

## 2022-09-23 ENCOUNTER — Encounter: Payer: Self-pay | Admitting: Internal Medicine

## 2022-09-26 NOTE — Telephone Encounter (Signed)
Chart up to date.

## 2022-09-28 ENCOUNTER — Encounter: Payer: Self-pay | Admitting: Internal Medicine

## 2022-09-28 ENCOUNTER — Ambulatory Visit (INDEPENDENT_AMBULATORY_CARE_PROVIDER_SITE_OTHER): Payer: Medicare Other | Admitting: Internal Medicine

## 2022-09-28 VITALS — BP 130/68 | HR 63 | Temp 98.1°F | Resp 15 | Ht 61.0 in | Wt 126.2 lb

## 2022-09-28 DIAGNOSIS — E538 Deficiency of other specified B group vitamins: Secondary | ICD-10-CM

## 2022-09-28 DIAGNOSIS — R4189 Other symptoms and signs involving cognitive functions and awareness: Secondary | ICD-10-CM | POA: Diagnosis not present

## 2022-09-28 DIAGNOSIS — E785 Hyperlipidemia, unspecified: Secondary | ICD-10-CM

## 2022-09-28 DIAGNOSIS — D126 Benign neoplasm of colon, unspecified: Secondary | ICD-10-CM | POA: Diagnosis not present

## 2022-09-28 DIAGNOSIS — I10 Essential (primary) hypertension: Secondary | ICD-10-CM | POA: Diagnosis not present

## 2022-09-28 DIAGNOSIS — E559 Vitamin D deficiency, unspecified: Secondary | ICD-10-CM | POA: Diagnosis not present

## 2022-09-28 DIAGNOSIS — N200 Calculus of kidney: Secondary | ICD-10-CM | POA: Diagnosis not present

## 2022-09-28 DIAGNOSIS — Z0000 Encounter for general adult medical examination without abnormal findings: Secondary | ICD-10-CM

## 2022-09-28 DIAGNOSIS — R5383 Other fatigue: Secondary | ICD-10-CM

## 2022-09-28 LAB — CBC WITH DIFFERENTIAL/PLATELET
Basophils Absolute: 0 10*3/uL (ref 0.0–0.1)
Basophils Relative: 0.8 % (ref 0.0–3.0)
Eosinophils Absolute: 0.1 10*3/uL (ref 0.0–0.7)
Eosinophils Relative: 1.7 % (ref 0.0–5.0)
HCT: 43.9 % (ref 36.0–46.0)
Hemoglobin: 14.9 g/dL (ref 12.0–15.0)
Lymphocytes Relative: 21.9 % (ref 12.0–46.0)
Lymphs Abs: 0.8 10*3/uL (ref 0.7–4.0)
MCHC: 33.9 g/dL (ref 30.0–36.0)
MCV: 94.2 fl (ref 78.0–100.0)
Monocytes Absolute: 0.4 10*3/uL (ref 0.1–1.0)
Monocytes Relative: 9.9 % (ref 3.0–12.0)
Neutro Abs: 2.4 10*3/uL (ref 1.4–7.7)
Neutrophils Relative %: 65.7 % (ref 43.0–77.0)
Platelets: 181 10*3/uL (ref 150.0–400.0)
RBC: 4.66 Mil/uL (ref 3.87–5.11)
RDW: 13.4 % (ref 11.5–15.5)
WBC: 3.7 10*3/uL — ABNORMAL LOW (ref 4.0–10.5)

## 2022-09-28 LAB — COMPREHENSIVE METABOLIC PANEL
ALT: 16 U/L (ref 0–35)
AST: 22 U/L (ref 0–37)
Albumin: 4.3 g/dL (ref 3.5–5.2)
Alkaline Phosphatase: 53 U/L (ref 39–117)
BUN: 13 mg/dL (ref 6–23)
CO2: 30 mEq/L (ref 19–32)
Calcium: 9.1 mg/dL (ref 8.4–10.5)
Chloride: 107 mEq/L (ref 96–112)
Creatinine, Ser: 0.79 mg/dL (ref 0.40–1.20)
GFR: 74.25 mL/min (ref >60.00)
Glucose, Bld: 98 mg/dL (ref 70–99)
Potassium: 4 mEq/L (ref 3.5–5.1)
Sodium: 143 mEq/L (ref 135–145)
Total Bilirubin: 0.6 mg/dL (ref 0.2–1.2)
Total Protein: 6.6 g/dL (ref 6.0–8.3)

## 2022-09-28 LAB — B12 AND FOLATE PANEL
Folate: >23.8 ng/mL (ref >5.9)
Vitamin B-12: 121 pg/mL — ABNORMAL LOW (ref 211–911)

## 2022-09-28 LAB — VITAMIN D 25 HYDROXY (VIT D DEFICIENCY, FRACTURES): VITD: 44.48 ng/mL (ref 30.00–100.00)

## 2022-09-28 LAB — TSH: TSH: 3.79 u[IU]/mL (ref 0.35–5.50)

## 2022-09-28 NOTE — Patient Instructions (Addendum)
Your next colonoscopy is due in 2022  You had an MRI of the pelvis in 2022 that was normal   You do not need to go to Urgent Care for  UTI unless you cannot get to the office to provide a urine sample .  We will run the urine and follow up with you 48 hours later   I would like you to start checking your blood pressure because it is high today .  Please send me readings in one week.

## 2022-09-28 NOTE — Progress Notes (Unsigned)
Patient ID: [name], female    DOB: Dec 20, 1950  Age: 71 y.o. MRN: [id number]  The patient is here for follow up and  management of other chronic and acute problems.   The risk factors are reflected in the social history.  The roster of all physicians providing medical care to patient - is listed in the Snapshot section of the chart.  Activities of daily living:  The patient is 100% independent in all ADLs: dressing, toileting, feeding as well as independent mobility  Home safety : The patient has smoke detectors in the home. They wear seatbelts.  There are no firearms at home. There is no violence in the home.   There is no risks for hepatitis, STDs or HIV. There is no   history of blood transfusion. They have no travel history to infectious disease endemic areas of the world.  The patient has seen their dentist in the last six month. They have seen their eye doctor in the last year. They admit to slight hearing difficulty with regard to whispered voices and some television programs.  They have deferred audiologic testing in the last year.  They do not  have excessive sun exposure. Discussed the need for sun protection: hats, long sleeves and use of sunscreen if there is significant sun exposure.   Diet: the importance of a healthy diet is discussed. They do have a healthy diet.  The benefits of regular aerobic exercise were discussed. She walks 4 times per week ,  20 minutes.   Depression screen: there are no signs or vegative symptoms of depression- irritability, change in appetite, anhedonia, sadness/tearfullness.  Cognitive assessment: the patient manages all their financial and personal affairs and is actively engaged. They could relate day,date,year and events; recalled 2/3 objects at 3 minutes; performed clock-face test normally.  The following portions of the patient's history were reviewed and updated as appropriate: allergies, current medications, past family history, past  medical history,  past surgical history, past social history  and problem list.  Visual acuity was not assessed per patient preference since she has regular follow up with her ophthalmologist. Hearing and body mass index were assessed and reviewed.   During the course of the visit the patient was educated and counseled about appropriate screening and preventive services including : fall prevention , diabetes screening, nutrition counseling, colorectal cancer screening, and recommended immunizations.    CC: There were no encounter diagnoses.  1) HTN:  has not been checking readings at home .  Thinks the elevation is due to anxiety  related to  caregiver stress.  Her husband's current treatment for pancreatic cancer  causing him to have high fevers and generalized weakness.  He has fallen several times. She is taking amlodipine 2.5 daily   2) nephrolithiasis:   still present in the renal pelvis  by July abdominal  films but not causing her any pain.  Has deferred lithotripsy due to Ed's refusal to be supervised by anyone but her.  Drinking litholyte , citrate enhanced 0  3) Hyperlipidemia:  taking Zetia   4) increased stress,  some memory issues  puts her phone down and forgets where she left it.   5) vitamin D deficiency.  [place] in creased the vitamin d then advised her to stop the vitamin D supplementation   History [name] has a past medical history of Adenomatous polyps (2010), Allergy, Arthritis, Chicken pox, Complication of anesthesia, GERD (gastroesophageal reflux disease), Hyperlipidemia, Hypertension, Kidney stone, Lymphadenopathy, axillary (02/25/2020), and  Pneumonia.   She has a past surgical history that includes Spine surgery; Extracorporeal shock wave lithotripsy (Left, 03/28/2019); Colonoscopy (2017); Cataract extraction w/PHACO (Left, 05/13/2020); Cataract extraction w/PHACO (Right, 06/10/2020); and Colonoscopy with propofol (N/A, 01/05/2021).   Her family history includes Breast  cancer (age of onset: 65) in her mother; Cancer in her mother; Diabetes in her mother; Heart disease in her father and mother; Hypertension in her father and mother; Stroke in her mother.She reports that she has never smoked. She has never used smokeless tobacco. She reports current alcohol use of about 3.0 standard drinks of alcohol per week. She reports that she does not use drugs.  Outpatient Medications Prior to Visit  Medication Sig Dispense Refill   amLODipine (NORVASC) 2.5 MG tablet TAKE 1 TABLET BY MOUTH AT BEDTIME. 90 tablet 2   EPINEPHrine (EPIPEN 2-PAK) 0.3 mg/0.3 mL IJ SOAJ injection Inject 0.3 mLs (0.3 mg total) into the muscle as needed for anaphylaxis. 1 each 1   ezetimibe (ZETIA) 10 MG tablet TAKE 1 TABLET BY MOUTH EVERY DAY 90 tablet 1   triamcinolone (NASACORT) 55 MCG/ACT AERO nasal inhaler Place 2 sprays into the nose as needed.     doxycycline (MONODOX) 100 MG capsule Take 1 capsule (100 mg total) by mouth 2 (two) times daily. 20 capsule 1   VITAMIN D, CHOLECALCIFEROL, PO Take 5,000 Units by mouth daily.     No facility-administered medications prior to visit.    Review of Systems  Objective:  BP 124/88 (BP Location: Left Arm, Patient Position: Sitting, Cuff Size: Small)   Pulse 63   Temp 98.1 F (36.7 C) (Temporal)   Resp 15   Ht '5\' 1"'$  (1.549 m)   Wt 126 lb 3.2 oz (57.2 kg)   SpO2 97%   BMI 23.85 kg/m   Physical Exam  Physical Exam   Assessment & Plan:   Problem List Items Addressed This Visit   None   I have discontinued [name] (VITAMIN D, CHOLECALCIFEROL, PO) and doxycycline. I am also having her maintain her triamcinolone, EPINEPHrine, ezetimibe, and amLODipine.    I provided 40 minutes of  face-to-face time during this encounter reviewing patient's current problems and past surgeries,  recent labs and imaging studies, providing counseling on the above mentioned problems , and coordination  of care .   Follow-up: No follow-ups on  file.   [name], MD

## 2022-09-29 ENCOUNTER — Encounter: Payer: Self-pay | Admitting: Internal Medicine

## 2022-09-29 ENCOUNTER — Telehealth: Payer: Self-pay

## 2022-09-29 DIAGNOSIS — E538 Deficiency of other specified B group vitamins: Secondary | ICD-10-CM | POA: Insufficient documentation

## 2022-09-29 LAB — LIPID PANEL W/REFLEX DIRECT LDL
Cholesterol: 198 mg/dL (ref <200)
HDL: 68 mg/dL (ref >=50)
LDL Cholesterol (Calc): 113 mg/dL (calc) — ABNORMAL HIGH
Non-HDL Cholesterol (Calc): 130 mg/dL (calc) — ABNORMAL HIGH (ref <130)
Total CHOL/HDL Ratio: 2.9 (calc) (ref <5.0)
Triglycerides: 75 mg/dL (ref <150)

## 2022-09-29 NOTE — Telephone Encounter (Signed)
Pt's husband is aware and gave a verbal understanding.

## 2022-09-29 NOTE — Assessment & Plan Note (Signed)
b12 deficiency diagnosed. patient asked to return for B12 injectionsand IF antibody level

## 2022-09-29 NOTE — Assessment & Plan Note (Signed)
Reviewed last colonoscopy and interval for next screening is correct.

## 2022-09-29 NOTE — Assessment & Plan Note (Signed)
Tolerating zetia due to statin intolerance. LDL is 113 by current labs.  No changes today  Lab Results  Component Value Date   CHOL 198 09/28/2022   HDL 68 09/28/2022   LDLCALC 113 (H) 09/28/2022   LDLDIRECT 173.0 06/06/2018   TRIG 75 09/28/2022   CHOLHDL 2.9 09/28/2022

## 2022-09-29 NOTE — Assessment & Plan Note (Signed)

## 2022-09-29 NOTE — Assessment & Plan Note (Signed)
Currently asymptomatic  managed with survillance and litholyte per Dr Bernardo Heater.  Size of largest stone on left is 6 mm unchanged per urology follow up May 2023.  Continue using litholyte .  She has deferred lithotripsy due to [facility] health.  Renal function is normal and unchanged   Lab Results  Component Value Date   CREATININE 0.79 09/28/2022

## 2022-09-29 NOTE — Telephone Encounter (Signed)
Is this okay for the pt to do?

## 2022-09-29 NOTE — Assessment & Plan Note (Signed)
She has not taken Vitamin D in several months per Urology.  Her level is normal   Last vitamin D Lab Results  Component Value Date   VD25OH 44.48 09/28/2022

## 2022-09-29 NOTE — Telephone Encounter (Signed)
Patient's husband, Armanii Urbanik, states he thinks it makes sense to have oral B12 for a month instead of coming in for injections, and then have the patient's B12 checked.  Percell Miller states he would like to have a call back.

## 2022-09-29 NOTE — Assessment & Plan Note (Signed)
Home readings are normal and renal function is normal; she has no proteinuria.   No changes to meds today  Lab Results  Component Value Date   CREATININE 0.79 09/28/2022   Lab Results  Component Value Date   NA 143 09/28/2022   K 4.0 09/28/2022   CL 107 09/28/2022   CO2 30 09/28/2022   Lab Results  Component Value Date   MICROALBUR 1.6 03/24/2022

## 2022-10-13 ENCOUNTER — Ambulatory Visit (INDEPENDENT_AMBULATORY_CARE_PROVIDER_SITE_OTHER): Payer: Medicare Other | Admitting: Dermatology

## 2022-10-13 DIAGNOSIS — D1801 Hemangioma of skin and subcutaneous tissue: Secondary | ICD-10-CM

## 2022-10-13 DIAGNOSIS — L814 Other melanin hyperpigmentation: Secondary | ICD-10-CM | POA: Diagnosis not present

## 2022-10-13 DIAGNOSIS — L821 Other seborrheic keratosis: Secondary | ICD-10-CM

## 2022-10-13 DIAGNOSIS — L578 Other skin changes due to chronic exposure to nonionizing radiation: Secondary | ICD-10-CM

## 2022-10-13 DIAGNOSIS — L719 Rosacea, unspecified: Secondary | ICD-10-CM | POA: Diagnosis not present

## 2022-10-13 DIAGNOSIS — L820 Inflamed seborrheic keratosis: Secondary | ICD-10-CM | POA: Diagnosis not present

## 2022-10-13 NOTE — Patient Instructions (Addendum)
Seborrheic Keratosis  What causes seborrheic keratoses? Seborrheic keratoses are harmless, common skin growths that first appear during adult life.  As time goes by, more growths appear.  Some people may develop a large number of them.  Seborrheic keratoses appear on both covered and uncovered body parts.  They are not caused by sunlight.  The tendency to develop seborrheic keratoses can be inherited.  They vary in color from skin-colored to gray, brown, or even black.  They can be either smooth or have a rough, warty surface.   Seborrheic keratoses are superficial and look as if they were stuck on the skin.  Under the microscope this type of keratosis looks like layers upon layers of skin.  That is why at times the top layer may seem to fall off, but the rest of the growth remains and re-grows.    Treatment Seborrheic keratoses do not need to be treated, but can easily be removed in the office.  Seborrheic keratoses often cause symptoms when they rub on clothing or jewelry.  Lesions can be in the way of shaving.  If they become inflamed, they can cause itching, soreness, or burning.  Removal of a seborrheic keratosis can be accomplished by freezing, burning, or surgery. If any spot bleeds, scabs, or grows rapidly, please return to have it checked, as these can be an indication of a skin cancer.  Cryotherapy Aftercare  Wash gently with soap and water everyday.   Apply Vaseline and Band-Aid daily until healed.    Due to recent changes in healthcare laws, you may see results of your pathology and/or laboratory studies on MyChart before the doctors have had a chance to review them. We understand that in some cases there may be results that are confusing or concerning to you. Please understand that not all results are received at the same time and often the doctors may need to interpret multiple results in order to provide you with the best plan of care or course of treatment. Therefore, we ask that you  please give us 2 business days to thoroughly review all your results before contacting the office for clarification. Should we see a critical lab result, you will be contacted sooner.   If You Need Anything After Your Visit  If you have any questions or concerns for your doctor, please call our main line at [phone] and press option 4 to reach your doctor's medical assistant. If no one answers, please leave a voicemail as directed and we will return your call as soon as possible. Messages left after 4 pm will be answered the following business day.   You may also send us a message via MyChart. We typically respond to MyChart messages within 1-2 business days.  For prescription refills, please ask your pharmacy to contact our office. Our fax number is [phone].  If you have an urgent issue when the clinic is closed that cannot wait until the next business day, you can page your doctor at the number below.    Please note that while we do our best to be available for urgent issues outside of office hours, we are not available 24/7.   If you have an urgent issue and are unable to reach us, you may choose to seek medical care at your doctor's office, retail clinic, urgent care center, or emergency room.  If you have a medical emergency, please immediately call 911 or go to the emergency department.  Pager Numbers  - Dr. Kowalski: [phone]  -   Dr. Moye: [phone]  - Dr. Stewart: [phone]  In the event of inclement weather, please call our main line at [phone] for an update on the status of any delays or closures.  Dermatology Medication Tips: Please keep the boxes that topical medications come in in order to help keep track of the instructions about where and how to use these. Pharmacies typically print the medication instructions only on the boxes and not directly on the medication tubes.   If your medication is too expensive, please contact our office at  [phone] option 4 or send us a message through MyChart.   We are unable to tell what your co-pay for medications will be in advance as this is different depending on your insurance coverage. However, we may be able to find a substitute medication at lower cost or fill out paperwork to get insurance to cover a needed medication.   If a prior authorization is required to get your medication covered by your insurance company, please allow us 1-2 business days to complete this process.  Drug prices often vary depending on where the prescription is filled and some pharmacies may offer cheaper prices.  The website www.goodrx.com contains coupons for medications through different pharmacies. The prices here do not account for what the cost may be with help from insurance (it may be cheaper with your insurance), but the website can give you the price if you did not use any insurance.  - You can print the associated coupon and take it with your prescription to the pharmacy.  - You may also stop by our office during regular business hours and pick up a GoodRx coupon card.  - If you need your prescription sent electronically to a different pharmacy, notify our office through [facility] MyChart or by phone at [phone] option 4.     Si Usted Necesita Algo Despus de Su Visita  Tambin puede enviarnos un mensaje a travs de MyChart. Por lo general respondemos a los mensajes de MyChart en el transcurso de 1 a 2 das hbiles.  Para renovar recetas, por favor pida a su farmacia que se ponga en contacto con nuestra oficina. Nuestro nmero de fax es el [phone].  Si tiene un asunto urgente cuando la clnica est cerrada y que no puede esperar hasta el siguiente da hbil, puede llamar/localizar a su doctor(a) al nmero que aparece a continuacin.   Por favor, tenga en cuenta que aunque hacemos todo lo posible para estar disponibles para asuntos urgentes fuera del horario de oficina, no estamos  disponibles las 24 horas del da, los 7 das de la semana.   Si tiene un problema urgente y no puede comunicarse con nosotros, puede optar por buscar atencin mdica  en el consultorio de su doctor(a), en una clnica privada, en un centro de atencin urgente o en una sala de emergencias.  Si tiene una emergencia mdica, por favor llame inmediatamente al 911 o vaya a la sala de emergencias.  Nmeros de bper  - Dr. Kowalski: [phone]  - Dra. Moye: [phone]  - Dra. Stewart: [phone]  En caso de inclemencias del tiempo, por favor llame a nuestra lnea principal al [phone] para una actualizacin sobre el estado de cualquier retraso o cierre.  Consejos para la medicacin en dermatologa: Por favor, guarde las cajas en las que vienen los medicamentos de uso tpico para ayudarle a seguir las instrucciones sobre dnde y cmo usarlos. Las farmacias generalmente imprimen las instrucciones del medicamento slo en las cajas y   no directamente en los tubos del medicamento.   Si su medicamento es muy caro, por favor, pngase en contacto con nuestra oficina llamando al [phone] y presione la opcin 4 o envenos un mensaje a travs de MyChart.   No podemos decirle cul ser su copago por los medicamentos por adelantado ya que esto es diferente dependiendo de la cobertura de su seguro. Sin embargo, es posible que podamos encontrar un medicamento sustituto a menor costo o llenar un formulario para que el seguro cubra el medicamento que se considera necesario.   Si se requiere una autorizacin previa para que su compaa de seguros cubra su medicamento, por favor permtanos de 1 a 2 das hbiles para completar este proceso.  Los precios de los medicamentos varan con frecuencia dependiendo del lugar de dnde se surte la receta y alguna farmacias pueden ofrecer precios ms baratos.  El sitio web www.goodrx.com tiene cupones para medicamentos de diferentes farmacias. Los precios aqu no  tienen en cuenta lo que podra costar con la ayuda del seguro (puede ser ms barato con su seguro), pero el sitio web puede darle el precio si no utiliz ningn seguro.  - Puede imprimir el cupn correspondiente y llevarlo con su receta a la farmacia.  - Tambin puede pasar por nuestra oficina durante el horario de atencin regular y recoger una tarjeta de cupones de GoodRx.  - Si necesita que su receta se enve electrnicamente a una farmacia diferente, informe a nuestra oficina a travs de MyChart de  o por telfono llamando al [phone] y presione la opcin 4.  

## 2022-10-13 NOTE — Progress Notes (Signed)
New Patient Visit  Subjective  [name] is a 71 y.o. female who presents for the following: Annual Exam. The patient presents for Upper Body Skin Exam (UBSE) for skin cancer screening and mole check.  The patient has spots, moles and lesions to be evaluated, some may be new or changing and the patient has concerns that these could be cancer.   Husband-Dr Manseau with patient   The following portions of the chart were reviewed this encounter and updated as appropriate:   Tobacco  Allergies  Meds  Problems  Med Hx  Surg Hx  Fam Hx     Review of Systems:  No other skin or systemic complaints except as noted in HPI or Assessment and Plan.  Objective  Well appearing patient in no apparent distress; mood and affect are within normal limits.  A focused examination was performed including face, left shoulder . Relevant physical exam findings are noted in the Assessment and Plan.  left shoulder x 2, right zygoma x 1  (3) (3) Stuck-on, waxy, tan-brown papules  -- Discussed benign etiology and prognosis.      nose Mid face erythema with telangiectasias    Assessment & Plan  Inflamed seborrheic keratosis (3) left shoulder x 2, right zygoma x 1  (3) Symptomatic, irritating, patient would like treated.   We will only treat one spot on the right zygoma today, we may treat the additional areas on the right face at the next office visit.  Destruction of lesion - left shoulder x 2, right zygoma x 1  (3) Complexity: simple   Destruction method: cryotherapy   Informed consent: discussed and consent obtained   Timeout:  patient name, date of birth, surgical site, and procedure verified Lesion destroyed using liquid nitrogen: Yes   Region frozen until ice ball extended beyond lesion: Yes   Outcome: patient tolerated procedure well with no complications   Post-procedure details: wound care instructions given    Rosacea nose Rosacea is a chronic progressive skin condition usually  affecting the face of adults, causing redness and/or acne bumps. It is treatable but not curable. It sometimes affects the eyes (ocular rosacea) as well. It may respond to topical and/or systemic medication and can flare with stress, sun exposure, alcohol, exercise, topical steroids (including hydrocortisone/cortisone 10) and some foods.  Daily application of broad spectrum spf 30+ sunscreen to face is recommended to reduce flares.  Counseling for BBL / IPL / Laser and Coordination of Care Discussed the treatment option of Broad Band Light (BBL) Intense Pulsed Light (IPL) / Laser.  Typically we recommend at least 1-3 treatment sessions about 5-8 weeks apart for best results.  The patient's condition may require "maintenance treatments" in the future.  The fee for BBL / laser treatments is $350 per treatment session for the whole face.  A fee can be quoted for other parts of the body. Insurance typically does not pay for BBL/laser treatments and therefore the fee is an out-of-pocket cost.  Seborrheic Keratoses - Stuck-on, waxy, tan-brown papules and/or plaques  - Benign-appearing - Discussed benign etiology and prognosis. - Observe - Call for any changes  Lentigines - Scattered tan macules - Due to sun exposure - Benign-appearing, observe - Recommend daily broad spectrum sunscreen SPF 30+ to sun-exposed areas, reapply every 2 hours as needed. - Call for any changes   Actinic Damage - chronic, secondary to cumulative UV radiation exposure/sun exposure over time - diffuse scaly erythematous macules with underlying dyspigmentation -  Recommend daily broad spectrum sunscreen SPF 30+ to sun-exposed areas, reapply every 2 hours as needed.  - Recommend staying in the shade or wearing long sleeves, sun glasses (UVA+UVB protection) and wide brim hats (4-inch brim around the entire circumference of the hat). - Call for new or changing lesions.   Hemangiomas - Red papules - Discussed benign nature -  Observe - Call for any changes   Return in about 3 months (around 01/12/2023) for ISK.  I, Marye Round, CMA, am acting as scribe for Sarina Ser, MD .  Documentation: I have reviewed the above documentation for accuracy and completeness, and I agree with the above.  Sarina Ser, MD

## 2022-10-22 ENCOUNTER — Encounter: Payer: Self-pay | Admitting: Dermatology

## 2022-11-24 ENCOUNTER — Other Ambulatory Visit: Payer: Self-pay | Admitting: Internal Medicine

## 2022-11-24 DIAGNOSIS — Z780 Asymptomatic menopausal state: Secondary | ICD-10-CM

## 2022-12-05 ENCOUNTER — Ambulatory Visit: Payer: Medicare Other

## 2022-12-05 VITALS — BP 126/73 | Ht 61.0 in | Wt 125.0 lb

## 2022-12-05 DIAGNOSIS — Z1231 Encounter for screening mammogram for malignant neoplasm of breast: Secondary | ICD-10-CM

## 2022-12-05 DIAGNOSIS — Z0000 Encounter for general adult medical examination without abnormal findings: Secondary | ICD-10-CM

## 2022-12-05 NOTE — Progress Notes (Cosign Needed Addendum)
Subjective:   [name] is a 72 y.o. female who presents for Medicare Annual (Subsequent) preventive examination.  Review of Systems    No ROS.  Medicare Wellness Virtual Visit.  Visual/audio telehealth visit, UTA vital signs.   See social history for additional risk factors.         Objective:    Today's Vitals   12/05/22 0935  BP: 126/73  Weight: 125 lb (56.7 kg)  Height: 5' 1"$  (1.549 m)   Body mass index is 23.62 kg/m.     12/05/2022    9:52 AM 04/27/2022    7:22 AM 11/18/2021    8:23 AM 01/05/2021    7:42 AM 11/17/2020    8:48 AM 06/10/2020   12:03 PM 05/13/2020   10:15 AM  Advanced Directives  Does Patient Have a Medical Advance Directive? Yes Yes Yes Yes Yes Yes Yes  Type of Paramedic of [place];Living will [place];Living will [place];Living will  [place];Living will [place];Living will [place];Living will  Does patient want to make changes to medical advance directive? No - Patient declined No - Patient declined No - Patient declined  No - Patient declined No - Patient declined   Copy of [place] in Chart? Yes - validated most recent copy scanned in chart (See row information) No - copy requested Yes - validated most recent copy scanned in chart (See row information)  Yes - validated most recent copy scanned in chart (See row information) Yes - validated most recent copy scanned in chart (See row information) Yes - validated most recent copy scanned in chart (See row information)    Current Medications (verified) Outpatient Encounter Medications as of 12/05/2022  Medication Sig   amLODipine (NORVASC) 2.5 MG tablet TAKE 1 TABLET BY MOUTH AT BEDTIME.   EPINEPHrine (EPIPEN 2-PAK) 0.3 mg/0.3 mL IJ SOAJ injection Inject 0.3 mLs (0.3 mg total) into the muscle as needed for anaphylaxis.   ezetimibe (ZETIA) 10 MG tablet TAKE 1  TABLET BY MOUTH EVERY DAY   triamcinolone (NASACORT) 55 MCG/ACT AERO nasal inhaler Place 2 sprays into the nose as needed.   No facility-administered encounter medications on file as of 12/05/2022.    Allergies (verified) Contrast media [iodinated contrast media], Iodine, Milk-related compounds, Prednisolone, Corn-containing products, Penicillins, and Sulfa antibiotics   History: Past Medical History:  Diagnosis Date   Adenomatous polyps 2010   colon   Allergy    Arthritis    Chicken pox    Complication of anesthesia    took a while to wake up from lithotripsy   GERD (gastroesophageal reflux disease)    Hyperlipidemia    Hypertension    Kidney stone    Lymphadenopathy, axillary 02/25/2020   Pneumonia    Past Surgical History:  Procedure Laterality Date   CATARACT EXTRACTION W/PHACO Left 05/13/2020   Procedure: CATARACT EXTRACTION PHACO AND INTRAOCULAR LENS PLACEMENT ([place]) LEFT;  Surgeon: [name], MD;  Location: [facility];  Service: Ophthalmology;  Laterality: Left;  6.61 0:53.0 12.4%   CATARACT EXTRACTION W/PHACO Right 06/10/2020   Procedure: CATARACT EXTRACTION PHACO AND INTRAOCULAR LENS PLACEMENT ([place]) RIGHT VIVITY TORIC LENS;  Surgeon: [name], MD;  Location: [facility];  Service: Ophthalmology;  Laterality: Right;  9.04 1:12.8 12.4%   COLONOSCOPY  2017   COLONOSCOPY WITH PROPOFOL N/A 01/05/2021   Procedure: COLONOSCOPY WITH PROPOFOL;  Surgeon: [name], MD;  Location: [facility];  Service: Endoscopy;  Laterality: N/A;   EXTRACORPOREAL SHOCK WAVE LITHOTRIPSY Left 03/28/2019   Procedure: EXTRACORPOREAL SHOCK WAVE LITHOTRIPSY (ESWL);  Surgeon: [name], MD;  Location: [facility];  Service: Urology;  Laterality: Left;   SPINE SURGERY     Family History  Problem Relation Age of Onset   Cancer Mother    Stroke Mother    Heart disease Mother    Hypertension Mother    Diabetes Mother    Breast cancer Mother 35   Heart  disease Father    Hypertension Father    Breast cancer Sister    Social History   Socioeconomic History   Marital status: Married    Spouse name: Not on file   Number of children: Not on file   Years of education: Not on file   Highest education level: Not on file  Occupational History   Not on file  Tobacco Use   Smoking status: Never   Smokeless tobacco: Never  Vaping Use   Vaping Use: Never used  Substance and Sexual Activity   Alcohol use: Yes    Alcohol/week: 3.0 standard drinks of alcohol    Types: 3 Glasses of wine per week   Drug use: No   Sexual activity: Yes  Other Topics Concern   Not on file  Social History Narrative   Not on file   Social Determinants of Health   Financial Resource Strain: Low Risk  (12/05/2022)   Overall Financial Resource Strain (CARDIA)    Difficulty of Paying Living Expenses: Not hard at all  Food Insecurity: No Food Insecurity (12/05/2022)   Hunger Vital Sign    Worried About Running Out of Food in the Last Year: Never true    Ran Out of Food in the Last Year: Never true  Transportation Needs: No Transportation Needs (12/05/2022)   PRAPARE - Hydrologist (Medical): No    Lack of Transportation (Non-Medical): No  Physical Activity: Sufficiently Active (12/05/2022)   Exercise Vital Sign    Days of Exercise per Week: 3 days    Minutes of Exercise per Session: 60 min  Stress: No Stress Concern Present (12/05/2022)   [place]    Feeling of Stress : Only a little  Social Connections: Unknown (12/05/2022)   Social Connection and Isolation Panel [NHANES]    Frequency of Communication with Friends and Family: Twice a week    Frequency of Social Gatherings with Friends and Family: Once a week    Attends Religious Services: Not on Advertising copywriter or Organizations: No    Attends Archivist Meetings: Never    Marital Status:  Married    Tobacco Counseling Counseling given: Not Answered   Clinical Intake:  Pre-visit preparation completed: Yes        Diabetes: No  How often do you need to have someone help you when you read instructions, pamphlets, or other written materials from your doctor or pharmacy?: 1 - Never   Interpreter Needed?: No      Activities of Daily Living    12/05/2022    9:40 AM 11/30/2022    8:21 AM  In your present state of health, do you have any difficulty performing the following activities:  Hearing? 0 0  Vision? 1 1  Difficulty concentrating or making decisions? 0 0  Walking or climbing stairs? 0 0  Dressing or  bathing? 0 0  Doing errands, shopping? 0 0  Preparing Food and eating ? N N  Using the Toilet? N N  In the past six months, have you accidently leaked urine? Y Y  Comment Managed with daily pad as needed   Do you have problems with loss of bowel control? N N  Managing your Medications? N N  Managing your Finances? N N  Housekeeping or managing your Housekeeping? N N    Patient Care Team: [name], MD as PCP - General (Internal Medicine)  Indicate any recent Medical Services you may have received from other than [facility] providers in the past year (date may be approximate).     Assessment:   This is a routine wellness examination for Amali.  I connected with  Carollee Massed on 12/05/22 by a audio enabled telemedicine application and verified that I am speaking with the correct person using two identifiers.  Patient Location: Home  Provider Location: Office/Clinic  I discussed the limitations of evaluation and management by telemedicine. The patient expressed understanding and agreed to proceed.   Hearing/Vision screen Hearing Screening - Comments:: Patient is able to hear conversational tones without difficulty. No issues reported. Followed by [facility].  Vision Screening - Comments:: Followed by [facility]  Cataract extraction,  bilateral Wears glasses for close up reading Dry eye; drops in use  Dietary issues and exercise activities discussed: Current Exercise Habits: Home exercise routine, Time (Minutes): 60, Frequency (Times/Week): 3, Weekly Exercise (Minutes/Week): 180, Intensity: Mild  Vegetables mostly  Good water intake   Goals Addressed             This Visit's Progress    Follow up with Primary Care Provider       As needed. Be more active with grandchildren.        Depression Screen    12/05/2022    9:46 AM 09/28/2022    8:17 AM 03/24/2022    9:30 AM 11/18/2021    8:26 AM 09/20/2021    9:36 AM 04/28/2021    1:21 PM 03/18/2021    9:45 AM  PHQ 2/9 Scores  PHQ - 2 Score 0 1 0 0 2 0 1  PHQ- 9 Score    0 4  5    Fall Risk    12/05/2022    9:40 AM 11/30/2022    8:21 AM 09/28/2022    8:17 AM 03/24/2022    9:30 AM 11/18/2021    8:24 AM  Fall Risk   Falls in the past year? 0 0 0 0 0  Number falls in past yr: 0 0 0  0  Injury with Fall? 0 0 0    Risk for fall due to :   No Fall Risks No Fall Risks   Follow up Falls evaluation completed;Falls prevention discussed  Falls evaluation completed Falls evaluation completed Falls evaluation completed    FALL RISK PREVENTION PERTAINING TO THE HOME: Home free of loose throw rugs in walkways, pet beds, electrical cords, etc? Yes  Adequate lighting in your home to reduce risk of falls? Yes   ASSISTIVE DEVICES UTILIZED TO PREVENT FALLS: Life alert? No  Use of a cane, walker or w/c? No  Grab bars in the bathroom? Yes  Shower chair or bench in shower? Yes  Elevated toilet seat or a handicapped toilet? Yes   TIMED UP AND GO: Was the test performed? No .   Cognitive Function:    11/06/2019  9:00 AM 10/26/2017    2:09 PM 10/15/2015    8:41 AM  MMSE - Mini Mental State Exam  Not completed: Unable to complete    Orientation to time  5 5  Orientation to Place  5 5  Registration  3 3  Attention/ Calculation  5 5  Recall  3 3  Language- name 2  objects  2 2  Language- repeat  1 1  Language- follow 3 step command  3 3  Language- read & follow direction  1 1  Write a sentence  1 1  Copy design  1 1  Total score  30 30        12/05/2022   10:00 AM 10/31/2018    9:12 AM 10/12/2016    3:52 PM  6CIT Screen  What Year? 0 points 0 points 0 points  What month? 0 points 0 points 0 points  What time? 0 points 0 points 0 points  Count back from 20  0 points 0 points  Months in reverse 0 points 0 points 0 points  Repeat phrase 0 points 0 points   Total Score  0 points     Immunizations Immunization History  Administered Date(s) Administered   Fluad Quad(high Dose 65+) 06/24/2019, 08/15/2022   Influenza Split 07/26/2012, 08/27/2013   Influenza, High Dose Seasonal PF 08/25/2015, 08/15/2016, 07/15/2018, 07/06/2021   Influenza-Unspecified 08/29/2014, 08/01/2017, 08/07/2018, 07/22/2020   Moderna Sars-Covid-2 Vaccination 11/19/2019, 12/17/2019, 08/16/2020, 01/19/2021   Pfizer Covid-19 Vaccine Bivalent Booster 37yr & up 07/06/2021, 08/01/2022   Pneumococcal Conjugate-13 09/02/2014   Pneumococcal Polysaccharide-23 06/27/2007, 10/07/2015   Respiratory Syncytial Virus Vaccine,Recomb Aduvanted(Arexvy) 07/15/2022   Tdap 12/25/2010, 12/13/2020   Zoster Recombinat (Shingrix) 12/11/2017, 04/21/2018   Zoster, Live 09/26/2013   Screening Tests Health Maintenance  Topic Date Due   COVID-19 Vaccine (7 - 2023-24 season) 12/21/2022 (Originally 09/26/2022)   MAMMOGRAM  03/18/2023   Medicare Annual Wellness (AWV)  12/06/2023   COLONOSCOPY (Pts 45-472yrInsurance coverage will need to be confirmed)  01/05/2026   DTaP/Tdap/Td (3 - Td or Tdap) 12/13/2030   Pneumonia Vaccine 6525Years old  Completed   INFLUENZA VACCINE  Completed   DEXA SCAN  Completed   Hepatitis C Screening  Completed   Zoster Vaccines- Shingrix  Completed   HPV VACCINES  Aged Out    Health Maintenance There are no preventive care reminders to display for this  patient.  Mammogram- ordered. Agrees to schedule. Number provided [phone]  Lung Cancer Screening: (Low Dose CT Chest recommended if Age 74-80ears, 30 pack-year currently smoking OR have quit w/in 15years.) does not qualify.   Hepatitis C Screening: Completed 09/2015.   Vision Screening: Recommended annual ophthalmology exams for early detection of glaucoma and other disorders of the eye.  Dental Screening: Recommended annual dental exams for proper oral hygiene  Community Resource Referral / Chronic Care Management: CRR required this visit?  No   CCM required this visit?  No      Plan:     I have personally reviewed and noted the following in the patient's chart:   Medical and social history Use of alcohol, tobacco or illicit drugs  Current medications and supplements including opioid prescriptions. Patient is not currently taking opioid prescriptions. Functional ability and status Nutritional status Physical activity Advanced directives List of other physicians Hospitalizations, surgeries, and ER visits in previous 12 months Vitals Screenings to include cognitive, depression, and falls Referrals and appointments  In addition, I have reviewed and  discussed with patient certain preventive protocols, quality metrics, and best practice recommendations. A written personalized care plan for preventive services as well as general preventive health recommendations were provided to patient.     [place], LPN   [id number]      I have reviewed the above information and agree with above.   Deborra Medina, MD

## 2022-12-05 NOTE — Addendum Note (Signed)
Addended by: Leta Jungling on: 12/05/2022 10:59 AM   Modules accepted: Orders

## 2022-12-05 NOTE — Patient Instructions (Addendum)
[name] , Thank you for taking time to come for your Medicare Wellness Visit. I appreciate your ongoing commitment to your health goals. Please review the following plan we discussed and let me know if I can assist you in the future.   These are the goals we discussed:  Goals      Follow up with Primary Care Provider     As needed. Be more active with grandchildren.         This is a list of the screening recommended for you and due dates:  Health Maintenance  Topic Date Due   COVID-19 Vaccine (7 - 2023-24 season) 12/21/2022*   Mammogram  03/18/2023   Medicare Annual Wellness Visit  12/06/2023   Colon Cancer Screening  01/05/2026   DTaP/Tdap/Td vaccine (3 - Td or Tdap) 12/13/2030   Pneumonia Vaccine  Completed   Flu Shot  Completed   DEXA scan (bone density measurement)  Completed   Hepatitis C Screening: USPSTF Recommendation to screen - Ages 20-79 yo.  Completed   Zoster (Shingles) Vaccine  Completed   HPV Vaccine  Aged Out  *Topic was postponed. The date shown is not the original due date.    Advanced directives: on file  Conditions/risks identified: none new  Next appointment: Follow up in one year for your annual wellness visit    Preventive Care 65 Years and Older, Female Preventive care refers to lifestyle choices and visits with your health care provider that can promote health and wellness. What does preventive care include? A yearly physical exam. This is also called an annual well check. Dental exams once or twice a year. Routine eye exams. Ask your health care provider how often you should have your eyes checked. Personal lifestyle choices, including: Daily care of your teeth and gums. Regular physical activity. Eating a healthy diet. Avoiding tobacco and drug use. Limiting alcohol use. Practicing safe sex. Taking low-dose aspirin every day. Taking vitamin and mineral supplements as recommended by your health care provider. What happens during an  annual well check? The services and screenings done by your health care provider during your annual well check will depend on your age, overall health, lifestyle risk factors, and family history of disease. Counseling  Your health care provider may ask you questions about your: Alcohol use. Tobacco use. Drug use. Emotional well-being. Home and relationship well-being. Sexual activity. Eating habits. History of falls. Memory and ability to understand (cognition). Work and work Statistician. Reproductive health. Screening  You may have the following tests or measurements: Height, weight, and BMI. Blood pressure. Lipid and cholesterol levels. These may be checked every 5 years, or more frequently if you are over 26 years old. Skin check. Lung cancer screening. You may have this screening every year starting at age 7 if you have a 30-pack-year history of smoking and currently smoke or have quit within the past 15 years. Fecal occult blood test (FOBT) of the stool. You may have this test every year starting at age 3. Flexible sigmoidoscopy or colonoscopy. You may have a sigmoidoscopy every 5 years or a colonoscopy every 10 years starting at age 21. Hepatitis C blood test. Hepatitis B blood test. Sexually transmitted disease (STD) testing. Diabetes screening. This is done by checking your blood sugar (glucose) after you have not eaten for a while (fasting). You may have this done every 1-3 years. Bone density scan. This is done to screen for osteoporosis. You may have this done starting at age 31. Mammogram.  This may be done every 1-2 years. Talk to your health care provider about how often you should have regular mammograms. Talk with your health care provider about your test results, treatment options, and if necessary, the need for more tests. Vaccines  Your health care provider may recommend certain vaccines, such as: Influenza vaccine. This is recommended every year. Tetanus,  diphtheria, and acellular pertussis (Tdap, Td) vaccine. You may need a Td booster every 10 years. Zoster vaccine. You may need this after age 69. Pneumococcal 13-valent conjugate (PCV13) vaccine. One dose is recommended after age 12. Pneumococcal polysaccharide (PPSV23) vaccine. One dose is recommended after age 52. Talk to your health care provider about which screenings and vaccines you need and how often you need them. This information is not intended to replace advice given to you by your health care provider. Make sure you discuss any questions you have with your health care provider. Document Released: 11/06/2015 Document Revised: 06/29/2016 Document Reviewed: 08/11/2015 Elsevier Interactive Patient Education  2017 [place] Prevention in the Home Falls can cause injuries. They can happen to people of all ages. There are many things you can do to make your home safe and to help prevent falls. What can I do on the outside of my home? Regularly fix the edges of walkways and driveways and fix any cracks. Remove anything that might make you trip as you walk through a door, such as a raised step or threshold. Trim any bushes or trees on the path to your home. Use bright outdoor lighting. Clear any walking paths of anything that might make someone trip, such as rocks or tools. Regularly check to see if handrails are loose or broken. Make sure that both sides of any steps have handrails. Any raised decks and porches should have guardrails on the edges. Have any leaves, snow, or ice cleared regularly. Use sand or salt on walking paths during winter. Clean up any spills in your garage right away. This includes oil or grease spills. What can I do in the bathroom? Use night lights. Install grab bars by the toilet and in the tub and shower. Do not use towel bars as grab bars. Use non-skid mats or decals in the tub or shower. If you need to sit down in the shower, use a plastic,  non-slip stool. Keep the floor dry. Clean up any water that spills on the floor as soon as it happens. Remove soap buildup in the tub or shower regularly. Attach bath mats securely with double-sided non-slip rug tape. Do not have throw rugs and other things on the floor that can make you trip. What can I do in the bedroom? Use night lights. Make sure that you have a light by your bed that is easy to reach. Do not use any sheets or blankets that are too big for your bed. They should not hang down onto the floor. Have a firm chair that has side arms. You can use this for support while you get dressed. Do not have throw rugs and other things on the floor that can make you trip. What can I do in the kitchen? Clean up any spills right away. Avoid walking on wet floors. Keep items that you use a lot in easy-to-reach places. If you need to reach something above you, use a strong step stool that has a grab bar. Keep electrical cords out of the way. Do not use floor polish or wax that makes floors slippery. If you  must use wax, use non-skid floor wax. Do not have throw rugs and other things on the floor that can make you trip. What can I do with my stairs? Do not leave any items on the stairs. Make sure that there are handrails on both sides of the stairs and use them. Fix handrails that are broken or loose. Make sure that handrails are as long as the stairways. Check any carpeting to make sure that it is firmly attached to the stairs. Fix any carpet that is loose or worn. Avoid having throw rugs at the top or bottom of the stairs. If you do have throw rugs, attach them to the floor with carpet tape. Make sure that you have a light switch at the top of the stairs and the bottom of the stairs. If you do not have them, ask someone to add them for you. What else can I do to help prevent falls? Wear shoes that: Do not have high heels. Have rubber bottoms. Are comfortable and fit you well. Are closed  at the toe. Do not wear sandals. If you use a stepladder: Make sure that it is fully opened. Do not climb a closed stepladder. Make sure that both sides of the stepladder are locked into place. Ask someone to hold it for you, if possible. Clearly mark and make sure that you can see: Any grab bars or handrails. First and last steps. Where the edge of each step is. Use tools that help you move around (mobility aids) if they are needed. These include: Canes. Walkers. Scooters. Crutches. Turn on the lights when you go into a dark area. Replace any light bulbs as soon as they burn out. Set up your furniture so you have a clear path. Avoid moving your furniture around. If any of your floors are uneven, fix them. If there are any pets around you, be aware of where they are. Review your medicines with your doctor. Some medicines can make you feel dizzy. This can increase your chance of falling. Ask your doctor what other things that you can do to help prevent falls. This information is not intended to replace advice given to you by your health care provider. Make sure you discuss any questions you have with your health care provider. Document Released: 08/06/2009 Document Revised: 03/17/2016 Document Reviewed: 11/14/2014 Elsevier Interactive Patient Education  2017 [employer].

## 2022-12-20 ENCOUNTER — Telehealth: Payer: Self-pay | Admitting: Internal Medicine

## 2022-12-20 MED ORDER — AMLODIPINE BESYLATE 2.5 MG PO TABS: 2.5000 mg | ORAL_TABLET | Freq: Every day | ORAL | 2 refills | Status: DC

## 2022-12-20 NOTE — Telephone Encounter (Signed)
Pt called stating her pharmacy is [facility] now and she would like the amLODipine sent there

## 2022-12-20 NOTE — Telephone Encounter (Signed)
Resent to [facility]

## 2023-01-10 ENCOUNTER — Other Ambulatory Visit: Payer: Self-pay | Admitting: Internal Medicine

## 2023-01-11 ENCOUNTER — Ambulatory Visit: Payer: PRIVATE HEALTH INSURANCE | Admitting: Dermatology

## 2023-01-17 ENCOUNTER — Other Ambulatory Visit (INDEPENDENT_AMBULATORY_CARE_PROVIDER_SITE_OTHER): Payer: Medicare Other

## 2023-01-17 DIAGNOSIS — E538 Deficiency of other specified B group vitamins: Secondary | ICD-10-CM | POA: Diagnosis not present

## 2023-01-20 LAB — INTRINSIC FACTOR ANTIBODIES: Intrinsic Factor: NEGATIVE

## 2023-01-24 DIAGNOSIS — H35371 Puckering of macula, right eye: Secondary | ICD-10-CM | POA: Diagnosis not present

## 2023-01-24 DIAGNOSIS — Z961 Presence of intraocular lens: Secondary | ICD-10-CM | POA: Diagnosis not present

## 2023-01-24 DIAGNOSIS — H43813 Vitreous degeneration, bilateral: Secondary | ICD-10-CM | POA: Diagnosis not present

## 2023-01-25 ENCOUNTER — Encounter: Payer: Self-pay | Admitting: Dermatology

## 2023-01-25 ENCOUNTER — Ambulatory Visit (INDEPENDENT_AMBULATORY_CARE_PROVIDER_SITE_OTHER): Payer: Medicare Other | Admitting: Dermatology

## 2023-01-25 VITALS — BP 139/89 | HR 79

## 2023-01-25 DIAGNOSIS — L820 Inflamed seborrheic keratosis: Secondary | ICD-10-CM

## 2023-01-25 DIAGNOSIS — L821 Other seborrheic keratosis: Secondary | ICD-10-CM

## 2023-01-25 DIAGNOSIS — L918 Other hypertrophic disorders of the skin: Secondary | ICD-10-CM

## 2023-01-25 DIAGNOSIS — L578 Other skin changes due to chronic exposure to nonionizing radiation: Secondary | ICD-10-CM

## 2023-01-25 NOTE — Progress Notes (Signed)
   Follow-Up Visit   Subjective  [name] is a 72 y.o. female who presents for the following: 3 months f/u on spots on her face and shoulders treated with LN2 3 months ago. The patient has lesions on the right axilla to be evaluated, new or changing.   The following portions of the chart were reviewed this encounter and updated as appropriate: medications, allergies, medical history  Review of Systems:  No other skin or systemic complaints except as noted in HPI or Assessment and Plan.  Objective  Well appearing patient in no apparent distress; mood and affect are within normal limits. A focused examination was performed of the following areas: face, shoulders, right arm  Relevant exam findings are noted in the Assessment and Plan.  Assessment & Plan   INFLAMED SEBORRHEIC KERATOSIS Exam: Erythematous keratotic or waxy stuck-on papule or plaque. Symptomatic, irritating, patient would like treated. Benign-appearing.  Call clinic for new or changing lesions.  Prior to procedure, discussed risks of blister formation, small wound, skin dyspigmentation, or rare scar following treatment. Recommend Vaseline ointment to treated areas while healing.  Destruction Procedure Note Destruction method: cryotherapy   Informed consent: discussed and consent obtained   Lesion destroyed using liquid nitrogen: Yes   Outcome: patient tolerated procedure well with no complications   Post-procedure details: wound care instructions given   Locations: right zygoma x 2, right axilla x 1 # of Lesions Treated: 3  SEBORRHEIC KERATOSIS - Stuck-on, waxy, tan-brown papules and/or plaques  - Benign-appearing - Discussed benign etiology and prognosis. - Observe - Call for any changes   ACROCHORDONS (Skin Tags) - Irritated  - Fleshy, skin-colored pedunculated papules - Benign appearing.  - Patient desires removal. Reviewed that this is not covered by insurance and they will be charged a cosmetic fee for  removal. Patient signed non-covered consent.  - Prior to procedure, discussed risks of blister formation, small wound, skin dyspigmentation, or rare scar following cryotherapy.   Destruction Procedure Note Destruction method: cryotherapy   Informed consent: discussed and consent obtained   Lesion destroyed using liquid nitrogen: Yes   Outcome: patient tolerated procedure well with no complications   Post-procedure details: wound care instructions given   Locations: right axiila  # of Lesions Treated: 1  ACTINIC DAMAGE - chronic, secondary to cumulative UV radiation exposure/sun exposure over time - diffuse scaly erythematous macules with underlying dyspigmentation - Recommend daily broad spectrum sunscreen SPF 30+ to sun-exposed areas, reapply every 2 hours as needed.  - Recommend staying in the shade or wearing long sleeves, sun glasses (UVA+UVB protection) and wide brim hats (4-inch brim around the entire circumference of the hat). - Call for new or changing lesions.  Return in about 8 months (around 09/26/2023) for TBSE, hx of ISKs .  IMarye Round, CMA, am acting as scribe for Sarina Ser, MD .   Documentation: I have reviewed the above documentation for accuracy and completeness, and I agree with the above.  Sarina Ser, MD

## 2023-01-25 NOTE — Patient Instructions (Addendum)
Cryotherapy Aftercare  Wash gently with soap and water everyday.   Apply Vaseline and Band-Aid daily until healed.     Due to recent changes in healthcare laws, you may see results of your pathology and/or laboratory studies on MyChart before the doctors have had a chance to review them. We understand that in some cases there may be results that are confusing or concerning to you. Please understand that not all results are received at the same time and often the doctors may need to interpret multiple results in order to provide you with the best plan of care or course of treatment. Therefore, we ask that you please give us 2 business days to thoroughly review all your results before contacting the office for clarification. Should we see a critical lab result, you will be contacted sooner.   If You Need Anything After Your Visit  If you have any questions or concerns for your doctor, please call our main line at [phone] and press option 4 to reach your doctor's medical assistant. If no one answers, please leave a voicemail as directed and we will return your call as soon as possible. Messages left after 4 pm will be answered the following business day.   You may also send us a message via MyChart. We typically respond to MyChart messages within 1-2 business days.  For prescription refills, please ask your pharmacy to contact our office. Our fax number is [phone].  If you have an urgent issue when the clinic is closed that cannot wait until the next business day, you can page your doctor at the number below.    Please note that while we do our best to be available for urgent issues outside of office hours, we are not available 24/7.   If you have an urgent issue and are unable to reach us, you may choose to seek medical care at your doctor's office, retail clinic, urgent care center, or emergency room.  If you have a medical emergency, please immediately call 911 or go to the  emergency department.  Pager Numbers  - Dr. Kowalski: [phone]  - Dr. Moye: [phone]  - Dr. Stewart: [phone]  In the event of inclement weather, please call our main line at [phone] for an update on the status of any delays or closures.  Dermatology Medication Tips: Please keep the boxes that topical medications come in in order to help keep track of the instructions about where and how to use these. Pharmacies typically print the medication instructions only on the boxes and not directly on the medication tubes.   If your medication is too expensive, please contact our office at [phone] option 4 or send us a message through MyChart.   We are unable to tell what your co-pay for medications will be in advance as this is different depending on your insurance coverage. However, we may be able to find a substitute medication at lower cost or fill out paperwork to get insurance to cover a needed medication.   If a prior authorization is required to get your medication covered by your insurance company, please allow us 1-2 business days to complete this process.  Drug prices often vary depending on where the prescription is filled and some pharmacies may offer cheaper prices.  The website www.goodrx.com contains coupons for medications through different pharmacies. The prices here do not account for what the cost may be with help from insurance (it may be cheaper with your insurance), but the website can   give you the price if you did not use any insurance.  - You can print the associated coupon and take it with your prescription to the pharmacy.  - You may also stop by our office during regular business hours and pick up a GoodRx coupon card.  - If you need your prescription sent electronically to a different pharmacy, notify our office through [facility] MyChart or by phone at [phone] option 4.     Si Usted Necesita Algo Despus de Su Visita  Tambin puede  enviarnos un mensaje a travs de MyChart. Por lo general respondemos a los mensajes de MyChart en el transcurso de 1 a 2 das hbiles.  Para renovar recetas, por favor pida a su farmacia que se ponga en contacto con nuestra oficina. Nuestro nmero de fax es el [phone].  Si tiene un asunto urgente cuando la clnica est cerrada y que no puede esperar hasta el siguiente da hbil, puede llamar/localizar a su doctor(a) al nmero que aparece a continuacin.   Por favor, tenga en cuenta que aunque hacemos todo lo posible para estar disponibles para asuntos urgentes fuera del horario de oficina, no estamos disponibles las 24 horas del da, los 7 das de la semana.   Si tiene un problema urgente y no puede comunicarse con nosotros, puede optar por buscar atencin mdica  en el consultorio de su doctor(a), en una clnica privada, en un centro de atencin urgente o en una sala de emergencias.  Si tiene una emergencia mdica, por favor llame inmediatamente al 911 o vaya a la sala de emergencias.  Nmeros de bper  - Dr. Kowalski: [phone]  - Dra. Moye: [phone]  - Dra. Stewart: [phone]  En caso de inclemencias del tiempo, por favor llame a nuestra lnea principal al [phone] para una actualizacin sobre el estado de cualquier retraso o cierre.  Consejos para la medicacin en dermatologa: Por favor, guarde las cajas en las que vienen los medicamentos de uso tpico para ayudarle a seguir las instrucciones sobre dnde y cmo usarlos. Las farmacias generalmente imprimen las instrucciones del medicamento slo en las cajas y no directamente en los tubos del medicamento.   Si su medicamento es muy caro, por favor, pngase en contacto con nuestra oficina llamando al [phone] y presione la opcin 4 o envenos un mensaje a travs de MyChart.   No podemos decirle cul ser su copago por los medicamentos por adelantado ya que esto es diferente dependiendo de la cobertura de su seguro.  Sin embargo, es posible que podamos encontrar un medicamento sustituto a menor costo o llenar un formulario para que el seguro cubra el medicamento que se considera necesario.   Si se requiere una autorizacin previa para que su compaa de seguros cubra su medicamento, por favor permtanos de 1 a 2 das hbiles para completar este proceso.  Los precios de los medicamentos varan con frecuencia dependiendo del lugar de dnde se surte la receta y alguna farmacias pueden ofrecer precios ms baratos.  El sitio web www.goodrx.com tiene cupones para medicamentos de diferentes farmacias. Los precios aqu no tienen en cuenta lo que podra costar con la ayuda del seguro (puede ser ms barato con su seguro), pero el sitio web puede darle el precio si no utiliz ningn seguro.  - Puede imprimir el cupn correspondiente y llevarlo con su receta a la farmacia.  - Tambin puede pasar por nuestra oficina durante el horario de atencin regular y recoger una tarjeta de cupones de GoodRx.  -   Si necesita que su receta se enve electrnicamente a una farmacia diferente, informe a nuestra oficina a travs de MyChart de [facility] o por telfono llamando al [phone] y presione la opcin 4.  

## 2023-01-27 ENCOUNTER — Encounter: Payer: Self-pay | Admitting: Internal Medicine

## 2023-03-13 ENCOUNTER — Other Ambulatory Visit: Payer: Self-pay | Admitting: *Deleted

## 2023-03-13 DIAGNOSIS — N200 Calculus of kidney: Secondary | ICD-10-CM

## 2023-03-15 ENCOUNTER — Encounter: Payer: Self-pay | Admitting: Urology

## 2023-03-15 ENCOUNTER — Ambulatory Visit (INDEPENDENT_AMBULATORY_CARE_PROVIDER_SITE_OTHER): Payer: Medicare Other | Admitting: Urology

## 2023-03-15 ENCOUNTER — Ambulatory Visit
Admission: RE | Admit: 2023-03-15 | Discharge: 2023-03-15 | Disposition: A | Discharge status: Home / Self Care | Payer: Medicare Other | Source: Ambulatory Visit | Attending: Urology | Admitting: Urology

## 2023-03-15 VITALS — BP 145/79 | HR 62 | Ht 61.0 in | Wt 129.0 lb

## 2023-03-15 DIAGNOSIS — N200 Calculus of kidney: Secondary | ICD-10-CM | POA: Diagnosis not present
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
## 2023-03-30 ENCOUNTER — Ambulatory Visit (INDEPENDENT_AMBULATORY_CARE_PROVIDER_SITE_OTHER): Payer: Medicare Other | Admitting: Internal Medicine

## 2023-03-30 ENCOUNTER — Encounter: Payer: Self-pay | Admitting: Internal Medicine

## 2023-03-30 VITALS — BP 114/74 | HR 78 | Temp 98.0°F | Ht 61.75 in | Wt 128.6 lb

## 2023-03-30 DIAGNOSIS — R5383 Other fatigue: Secondary | ICD-10-CM

## 2023-03-30 DIAGNOSIS — I10 Essential (primary) hypertension: Secondary | ICD-10-CM

## 2023-03-30 DIAGNOSIS — E785 Hyperlipidemia, unspecified: Secondary | ICD-10-CM

## 2023-03-30 DIAGNOSIS — N200 Calculus of kidney: Secondary | ICD-10-CM

## 2023-03-30 DIAGNOSIS — R7301 Impaired fasting glucose: Secondary | ICD-10-CM

## 2023-03-30 LAB — COMPREHENSIVE METABOLIC PANEL
ALT: 20 U/L (ref 0–35)
AST: 23 U/L (ref 0–37)
Albumin: 4.3 g/dL (ref 3.5–5.2)
Alkaline Phosphatase: 68 U/L (ref 39–117)
BUN: 23 mg/dL (ref 6–23)
CO2: 29 mEq/L (ref 19–32)
Calcium: 9.2 mg/dL (ref 8.4–10.5)
Chloride: 106 mEq/L (ref 96–112)
Creatinine, Ser: 0.8 mg/dL (ref 0.40–1.20)
GFR: 72.88 mL/min (ref >60.00)
Glucose, Bld: 95 mg/dL (ref 70–99)
Potassium: 4.3 mEq/L (ref 3.5–5.1)
Sodium: 143 mEq/L (ref 135–145)
Total Bilirubin: 0.5 mg/dL (ref 0.2–1.2)
Total Protein: 7 g/dL (ref 6.0–8.3)

## 2023-03-30 LAB — CBC WITH DIFFERENTIAL/PLATELET
Basophils Absolute: 0.1 10*3/uL (ref 0.0–0.1)
Basophils Relative: 1.2 % (ref 0.0–3.0)
Eosinophils Absolute: 0.1 10*3/uL (ref 0.0–0.7)
Eosinophils Relative: 2.6 % (ref 0.0–5.0)
HCT: 42.3 % (ref 36.0–46.0)
Hemoglobin: 13.9 g/dL (ref 12.0–15.0)
Lymphocytes Relative: 23.9 % (ref 12.0–46.0)
Lymphs Abs: 1.1 10*3/uL (ref 0.7–4.0)
MCHC: 32.8 g/dL (ref 30.0–36.0)
MCV: 95.4 fl (ref 78.0–100.0)
Monocytes Absolute: 0.5 10*3/uL (ref 0.1–1.0)
Monocytes Relative: 11 % (ref 3.0–12.0)
Neutro Abs: 2.7 10*3/uL (ref 1.4–7.7)
Neutrophils Relative %: 61.3 % (ref 43.0–77.0)
Platelets: 175 10*3/uL (ref 150.0–400.0)
RBC: 4.44 Mil/uL (ref 3.87–5.11)
RDW: 13.2 % (ref 11.5–15.5)
WBC: 4.5 10*3/uL (ref 4.0–10.5)

## 2023-03-30 LAB — MICROALBUMIN / CREATININE URINE RATIO
Creatinine,U: 138.8 mg/dL
Microalb Creat Ratio: 1 mg/g (ref 0.0–30.0)
Microalb, Ur: 1.4 mg/dL (ref 0.0–1.9)

## 2023-03-30 LAB — HEMOGLOBIN A1C: Hgb A1c MFr Bld: 5.4 % (ref 4.6–6.5)

## 2023-03-30 LAB — LIPID PANEL
Cholesterol: 180 mg/dL (ref 0–200)
HDL: 65.5 mg/dL (ref >39.00)
LDL Cholesterol: 95 mg/dL (ref 0–99)
NonHDL: 114.08
Total CHOL/HDL Ratio: 3
Triglycerides: 93 mg/dL (ref 0.0–149.0)
VLDL: 18.6 mg/dL (ref 0.0–40.0)

## 2023-03-30 LAB — LDL CHOLESTEROL, DIRECT: Direct LDL: 109 mg/dL

## 2023-03-30 MED ORDER — DOXYCYCLINE HYCLATE 100 MG PO TABS: 100.0000 mg | ORAL_TABLET | Freq: Two times a day (BID) | ORAL | 0 refills | Status: DC

## 2023-03-30 NOTE — Progress Notes (Signed)
Subjective:  Patient ID: [name], female    DOB: [birth year]  Age: 72 y.o. MRN: [id number]  CC: The primary encounter diagnosis was Essential hypertension, benign. Diagnoses of Hyperlipidemia with target LDL less than 130, Other fatigue, Impaired fasting glucose, Caregiver with fatigue, and Bilateral nephrolithiasis were also pertinent to this visit.   HPI Otho Darner presents for  Chief Complaint  Patient presents with   Medical Management of Chronic Issues  g  Husband receiving chemo at [facility] ,for pancreatic CA,   which has progressed despite chemotherapy . His side effects of chemo include  sensory and motor neuropathy, cognitive decline,   proximal muscle weakness (inability to stand and use bathroom ) ,  more recently lapses in judgement  resulting in significant financial mistakes .  His oncologist  has initiated discussions about "end of life" due to return of cancer  .  She has become his POA  and has hired an Engineer, production to help on the weekends .  He insists on continuing chemotherapy despite the side effects and progression of disease.  She walks on eggshells because his personality has become quite Jekyll and Hyde-ish.  She has had difficulty dealing with the financial instituations despite the POA   She is not sleeping well except when she is staying at [facility] .  The trips to the [facility] are limited by  her husband's diarrhea.  She declines medication and is using literature but is reading from her I phone.  Her new lenses (post cataract removal ) has a blue light filter. Her eyes have become very sensitive to halogen head lights.   Several family members and close friends have succumbed to CA   Up to date with  dermatology   Has postponed lithotripsy due to the 2 weeks of anticipated recovery . No recent episodes of hematuria .  She  persistent left kidney stone 5 mm .  Has changed diet.  Lots of citrus   BPs M 120/80 at home     Outpatient Medications Prior to  Visit  Medication Sig Dispense Refill   amLODipine (NORVASC) 2.5 MG tablet Take 1 tablet (2.5 mg total) by mouth at bedtime. 90 tablet 2   Cyanocobalamin (B-12) 1000 MCG TABS      EPINEPHrine (EPIPEN 2-PAK) 0.3 mg/0.3 mL IJ SOAJ injection Inject 0.3 mLs (0.3 mg total) into the muscle as needed for anaphylaxis. 1 each 1   ezetimibe (ZETIA) 10 MG tablet TAKE ONE TABLET BY MOUTH ONE TIME DAILY 90 tablet 3   triamcinolone (NASACORT) 55 MCG/ACT AERO nasal inhaler Place 2 sprays into the nose as needed.     vitamin E 200 UNIT capsule Take 200 Units by mouth daily.     No facility-administered medications prior to visit.    Review of Systems;  Patient denies headache, fevers, malaise, unintentional weight loss, skin rash, eye pain, sinus congestion and sinus pain, sore throat, dysphagia,  hemoptysis , cough, dyspnea, wheezing, chest pain, palpitations, orthopnea, edema, abdominal pain, nausea, melena, diarrhea, constipation, flank pain, dysuria, hematuria, urinary  Frequency, nocturia, numbness, tingling, seizures,  Focal weakness, Loss of consciousness,  Tremor, insomnia, depression, anxiety, and suicidal ideation.      Objective:  BP 114/74   Pulse 78   Temp 98 F (36.7 C)   Ht 5' 1.75" (1.568 m)   Wt 128 lb 9.6 oz (58.3 kg)   SpO2 98%   BMI 23.71 kg/m   BP Readings from Last 3 Encounters:  03/30/23 114/74  03/15/23 (!) 145/79  01/25/23 139/89    Wt Readings from Last 3 Encounters:  03/30/23 128 lb 9.6 oz (58.3 kg)  03/15/23 129 lb (58.5 kg)  12/05/22 125 lb (56.7 kg)    Physical Exam Vitals reviewed.  Constitutional:      General: She is not in acute distress.    Appearance: Normal appearance. She is normal weight. She is not ill-appearing, toxic-appearing or diaphoretic.  HENT:     Head: Normocephalic.  Eyes:     General: No scleral icterus.       Right eye: No discharge.        Left eye: No discharge.     Conjunctiva/sclera: Conjunctivae normal.  Musculoskeletal:         General: Normal range of motion.  Skin:    General: Skin is warm and dry.  Neurological:     General: No focal deficit present.     Mental Status: She is alert and oriented to person, place, and time. Mental status is at baseline.  Psychiatric:        Mood and Affect: Mood normal.        Behavior: Behavior normal.        Thought Content: Thought content normal.        Judgment: Judgment normal.     Lab Results  Component Value Date   HGBA1C 5.4 03/30/2023    Lab Results  Component Value Date   CREATININE 0.80 03/30/2023   CREATININE 0.79 09/28/2022   CREATININE 0.77 04/27/2022    Lab Results  Component Value Date   WBC 4.5 03/30/2023   HGB 13.9 03/30/2023   HCT 42.3 03/30/2023   PLT 175.0 03/30/2023   GLUCOSE 95 03/30/2023   CHOL 180 03/30/2023   TRIG 93.0 03/30/2023   HDL 65.50 03/30/2023   LDLDIRECT 109.0 03/30/2023   LDLCALC 95 03/30/2023   ALT 20 03/30/2023   AST 23 03/30/2023   NA 143 03/30/2023   K 4.3 03/30/2023   CL 106 03/30/2023   CREATININE 0.80 03/30/2023   BUN 23 03/30/2023   CO2 29 03/30/2023   TSH 3.79 09/28/2022   HGBA1C 5.4 03/30/2023   MICROALBUR 1.4 03/30/2023    Abdomen 1 view (KUB)  Result Date: 03/20/2023 CLINICAL DATA:  kidney stone EXAM: ABDOMEN - 1 VIEW COMPARISON:  X-ray abdomen 04/27/2022, CT renal 04/27/2022 FINDINGS: The bowel gas pattern is normal. Calcifications measuring up to 5 mm overlying the left renal shadow may represent nephrolithiasis. L5-S1 left posterolateral and interbody fusion. IMPRESSION: Nonobstructive bowel gas pattern. Electronically Signed   By: Tish Frederickson M.D.   On: 03/20/2023 01:42    Assessment & Plan:  .Essential hypertension, benign Assessment & Plan: Home readings are normal and renal function is normal; she has no proteinuria.   No changes to meds today  Lab Results  Component Value Date   CREATININE 0.80 03/30/2023   Lab Results  Component Value Date   NA 143 03/30/2023   K 4.3  03/30/2023   CL 106 03/30/2023   CO2 29 03/30/2023   Lab Results  Component Value Date   MICROALBUR 1.4 03/30/2023   MICROALBUR 1.6 03/24/2022       Orders: -     Comprehensive metabolic panel -     Microalbumin / creatinine urine ratio  Hyperlipidemia with target LDL less than 130 Assessment & Plan: Tolerating zetia due to statin intolerance. LDL is  95 by current labs.  No changes today  Lab Results  Component Value Date   CHOL 180 03/30/2023   HDL 65.50 03/30/2023   LDLCALC 95 03/30/2023   LDLDIRECT 109.0 03/30/2023   TRIG 93.0 03/30/2023   CHOLHDL 3 03/30/2023     Orders: -     Lipid panel -     LDL cholesterol, direct  Other fatigue -     CBC with Differential/Platelet  Impaired fasting glucose -     Comprehensive metabolic panel -     Hemoglobin A1c  Caregiver with fatigue Assessment & Plan: Related to her husband's cognitive  and personality changes during treatment of pancreatic  cancer  . She declines trial of a ntidepressant   Bilateral nephrolithiasis Assessment & Plan: Currently asymptomatic  managed with survillance and litholyte per Dr Lonna Cobb.  Size of largest stone on left is 6 mm unchanged per urology follow up May 2023.  Continue using litholyte .  She has deferred lithotripsy due to [facility] health.  Renal function is normal and unchanged   Lab Results  Component Value Date   CREATININE 0.80 03/30/2023      Other orders -     Doxycycline Hyclate; Take 1 tablet (100 mg total) by mouth 2 (two) times daily.  Dispense: 14 tablet; Refill: 0     I provided 30 minutes of face-to-face time during this encounter reviewing patient's last visit with me, patient's  most recent visit with cardiology,  nephrology,  and neurology,  recent surgical and non surgical procedures, previous  labs and imaging studies, counseling on currently addressed issues,  and post visit ordering to diagnostics and therapeutics .   Follow-up: Return in about 6 months  (around 09/29/2023) for physical.   Sherlene Shams, MD

## 2023-03-30 NOTE — Patient Instructions (Signed)
You are not a candidate for Cologuard because your pre test probability is higher than average  Your next colonoscopy is due in 2027

## 2023-04-01 NOTE — Assessment & Plan Note (Signed)
Home readings are normal and renal function is normal; she has no proteinuria.   No changes to meds today  Lab Results  Component Value Date   CREATININE 0.80 03/30/2023   Lab Results  Component Value Date   NA 143 03/30/2023   K 4.3 03/30/2023   CL 106 03/30/2023   CO2 29 03/30/2023   Lab Results  Component Value Date   MICROALBUR 1.4 03/30/2023   MICROALBUR 1.6 03/24/2022

## 2023-04-01 NOTE — Assessment & Plan Note (Signed)
Related to her husband's cognitive  and personality changes during treatment of pancreatic  cancer  . She declines trial of a ntidepressant

## 2023-04-01 NOTE — Assessment & Plan Note (Signed)
Currently asymptomatic  managed with survillance and litholyte per Dr Lonna Cobb.  Size of largest stone on left is 6 mm unchanged per urology follow up May 2023.  Continue using litholyte .  She has deferred lithotripsy due to [facility] health.  Renal function is normal and unchanged   Lab Results  Component Value Date   CREATININE 0.80 03/30/2023

## 2023-04-01 NOTE — Assessment & Plan Note (Signed)
Tolerating zetia due to statin intolerance. LDL is  95 by current labs.  No changes today  Lab Results  Component Value Date   CHOL 180 03/30/2023   HDL 65.50 03/30/2023   LDLCALC 95 03/30/2023   LDLDIRECT 109.0 03/30/2023   TRIG 93.0 03/30/2023   CHOLHDL 3 03/30/2023

## 2023-04-03 ENCOUNTER — Ambulatory Visit
Admission: RE | Admit: 2023-04-03 | Discharge: 2023-04-03 | Disposition: A | Discharge status: Home / Self Care | Payer: Medicare Other | Source: Ambulatory Visit | Attending: Internal Medicine | Admitting: Internal Medicine

## 2023-04-03 DIAGNOSIS — Z780 Asymptomatic menopausal state: Secondary | ICD-10-CM | POA: Diagnosis not present

## 2023-04-03 DIAGNOSIS — M8580 Other specified disorders of bone density and structure, unspecified site: Secondary | ICD-10-CM | POA: Diagnosis not present

## 2023-04-03 DIAGNOSIS — Z1231 Encounter for screening mammogram for malignant neoplasm of breast: Secondary | ICD-10-CM | POA: Diagnosis not present

## 2023-04-03 DIAGNOSIS — M85852 Other specified disorders of bone density and structure, left thigh: Secondary | ICD-10-CM | POA: Diagnosis not present

## 2023-08-04 DIAGNOSIS — Z23 Encounter for immunization: Secondary | ICD-10-CM | POA: Diagnosis not present

## 2023-08-29 ENCOUNTER — Other Ambulatory Visit: Payer: Self-pay | Admitting: Internal Medicine

## 2023-08-29 DIAGNOSIS — Z23 Encounter for immunization: Secondary | ICD-10-CM | POA: Diagnosis not present

## 2023-09-27 ENCOUNTER — Encounter: Payer: Self-pay | Admitting: Dermatology

## 2023-09-27 ENCOUNTER — Ambulatory Visit: Payer: Medicare Other | Admitting: Dermatology

## 2023-09-27 DIAGNOSIS — I781 Nevus, non-neoplastic: Secondary | ICD-10-CM | POA: Diagnosis not present

## 2023-09-27 DIAGNOSIS — D1801 Hemangioma of skin and subcutaneous tissue: Secondary | ICD-10-CM | POA: Diagnosis not present

## 2023-09-27 DIAGNOSIS — L578 Other skin changes due to chronic exposure to nonionizing radiation: Secondary | ICD-10-CM | POA: Diagnosis not present

## 2023-09-27 DIAGNOSIS — Z1283 Encounter for screening for malignant neoplasm of skin: Secondary | ICD-10-CM

## 2023-09-27 DIAGNOSIS — L821 Other seborrheic keratosis: Secondary | ICD-10-CM

## 2023-09-27 DIAGNOSIS — W908XXA Exposure to other nonionizing radiation, initial encounter: Secondary | ICD-10-CM | POA: Diagnosis not present

## 2023-09-27 DIAGNOSIS — D229 Melanocytic nevi, unspecified: Secondary | ICD-10-CM

## 2023-09-27 DIAGNOSIS — L814 Other melanin hyperpigmentation: Secondary | ICD-10-CM | POA: Diagnosis not present

## 2023-09-27 DIAGNOSIS — I8393 Asymptomatic varicose veins of bilateral lower extremities: Secondary | ICD-10-CM | POA: Diagnosis not present

## 2023-09-27 NOTE — Patient Instructions (Signed)

## 2023-09-27 NOTE — Progress Notes (Signed)
   Follow-Up Visit   Subjective  [name] is a 72 y.o. female who presents for the following: Skin Cancer Screening and Full Body Skin Exam  The patient presents for Total-Body Skin Exam (TBSE) for skin cancer screening and mole check. The patient has spots, moles and lesions to be evaluated, some may be new or changing and the patient may have concern these could be cancer.  The following portions of the chart were reviewed this encounter and updated as appropriate: medications, allergies, medical history  Review of Systems:  No other skin or systemic complaints except as noted in HPI or Assessment and Plan.  Objective  Well appearing patient in no apparent distress; mood and affect are within normal limits.  A full examination was performed including scalp, head, eyes, ears, nose, lips, neck, chest, axillae, abdomen, back, buttocks, bilateral upper extremities, bilateral lower extremities, hands, feet, fingers, toes, fingernails, and toenails. All findings within normal limits unless otherwise noted below.   Relevant physical exam findings are noted in the Assessment and Plan.   Assessment & Plan   SKIN CANCER SCREENING PERFORMED TODAY.  ACTINIC DAMAGE - Chronic condition, secondary to cumulative UV/sun exposure - diffuse scaly erythematous macules with underlying dyspigmentation - Recommend daily broad spectrum sunscreen SPF 30+ to sun-exposed areas, reapply every 2 hours as needed.  - Staying in the shade or wearing long sleeves, sun glasses (UVA+UVB protection) and wide brim hats (4-inch brim around the entire circumference of the hat) are also recommended for sun protection.  - Call for new or changing lesions.  LENTIGINES, SEBORRHEIC KERATOSES, HEMANGIOMAS - Benign normal skin lesions - Benign-appearing - Call for any changes  MELANOCYTIC NEVI - Tan-brown and/or pink-flesh-colored symmetric macules and papules - Benign appearing on exam today - Observation - Call  clinic for new or changing moles - Recommend daily use of broad spectrum spf 30+ sunscreen to sun-exposed areas.   Varicose Veins/Spider Veins - Dilated blue, purple or red veins at the lower extremities - Reassured - Smaller vessels can be treated by sclerotherapy (a procedure to inject a medicine into the veins to make them disappear) if desired, but the treatment is not covered by insurance. Larger vessels may be covered if symptomatic and we would refer to vascular surgeon if treatment desired. Actinic skin damage  Skin cancer screening  Seborrheic keratosis  Lentigo  Melanocytic nevus, unspecified location   Return in about 1 year (around 09/26/2024) for TBSE.  Maylene Roes, CMA, am acting as scribe for Armida Sans, MD .   Documentation: I have reviewed the above documentation for accuracy and completeness, and I agree with the above.  Armida Sans, MD

## 2023-09-29 ENCOUNTER — Encounter: Payer: PRIVATE HEALTH INSURANCE | Admitting: Internal Medicine

## 2023-10-02 ENCOUNTER — Encounter: Payer: PRIVATE HEALTH INSURANCE | Admitting: Internal Medicine

## 2023-10-31 ENCOUNTER — Encounter: Payer: Self-pay | Admitting: Internal Medicine

## 2023-10-31 ENCOUNTER — Ambulatory Visit (INDEPENDENT_AMBULATORY_CARE_PROVIDER_SITE_OTHER): Payer: Medicare Other | Admitting: Internal Medicine

## 2023-10-31 VITALS — BP 136/72 | HR 64 | Ht 60.75 in | Wt 132.8 lb

## 2023-10-31 DIAGNOSIS — Z803 Family history of malignant neoplasm of breast: Secondary | ICD-10-CM | POA: Diagnosis not present

## 2023-10-31 DIAGNOSIS — R5383 Other fatigue: Secondary | ICD-10-CM | POA: Diagnosis not present

## 2023-10-31 DIAGNOSIS — M8580 Other specified disorders of bone density and structure, unspecified site: Secondary | ICD-10-CM

## 2023-10-31 DIAGNOSIS — E559 Vitamin D deficiency, unspecified: Secondary | ICD-10-CM | POA: Diagnosis not present

## 2023-10-31 DIAGNOSIS — Z860101 Personal history of adenomatous and serrated colon polyps: Secondary | ICD-10-CM

## 2023-10-31 DIAGNOSIS — E785 Hyperlipidemia, unspecified: Secondary | ICD-10-CM

## 2023-10-31 DIAGNOSIS — I10 Essential (primary) hypertension: Secondary | ICD-10-CM | POA: Diagnosis not present

## 2023-10-31 DIAGNOSIS — Z780 Asymptomatic menopausal state: Secondary | ICD-10-CM

## 2023-10-31 DIAGNOSIS — Z1231 Encounter for screening mammogram for malignant neoplasm of breast: Secondary | ICD-10-CM

## 2023-10-31 DIAGNOSIS — R7301 Impaired fasting glucose: Secondary | ICD-10-CM | POA: Diagnosis not present

## 2023-10-31 MED ORDER — MUPIROCIN 2 % EX OINT: 1.0000 | TOPICAL_OINTMENT | Freq: Two times a day (BID) | CUTANEOUS | 5 refills | Status: DC

## 2023-10-31 NOTE — Patient Instructions (Signed)
 I will repeat your DEXA this year ; please ask Dr Retta Mac to send me any information he has about the condition of your jaw bone  We can consider Evista as it is not associated with osteonecrosis of the jaw

## 2023-10-31 NOTE — Assessment & Plan Note (Addendum)
 She has been on an alendronate holiday since Nov 2022.   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  Discussed resuming nonbisphosphonate therapy with Evista pending receipt of records from oral surgeon

## 2023-10-31 NOTE — Progress Notes (Signed)
 Patient ID: [name], female    DOB: 06/09/1951  Age: 73 y.o. MRN: [id number]  The patient is here for  management of other chronic and acute problems.   The risk factors are reflected in the social history.   The roster of all physicians providing medical care to patient - is listed in the Snapshot section of the chart.   Activities of daily living:  The patient is 100% independent in all ADLs: dressing, toileting, feeding as well as independent mobility   Home safety : The patient Solomon smoke detectors in the home. They wear seatbelts.  There are no unsecured firearms at home. There is no violence in the home.    There is no risks for hepatitis, STDs or HIV. There is no   history of blood transfusion. They have no travel history to infectious disease endemic areas of the world.   The patient Solomon seen their dentist in the last six month. They have seen their eye doctor in the last year. The patinet  denies slight hearing difficulty with regard to whispered voices and some television programs.  They have deferred audiologic testing in the last year.  They do not  have excessive sun exposure. Discussed the need for sun protection: hats, long sleeves and use of sunscreen if there is significant sun exposure.    Diet: the importance of a healthy diet is discussed. They do have a healthy diet.   The benefits of regular aerobic exercise were discussed. The patient  exercises  3 to 5 days per week  for  60 minutes.    Depression screen: there are no signs or vegative symptoms of depression- irritability, change in appetite, anhedonia, sadness/tearfullness.   The following portions of the patient's history were reviewed and updated as appropriate: allergies, current medications, past family history, past medical history,  past surgical history, past social history  and problem list.   Visual acuity was not assessed per patient preference since the patient Solomon regular follow up with an   ophthalmologist. Hearing and body mass index were assessed and reviewed.    During the course of the visit the patient was educated and counseled about appropriate screening and preventive services including : fall prevention , diabetes screening, nutrition counseling, colorectal cancer screening, and recommended immunizations.    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 [name] been on alendronate   holiday  in Nov 2022   Husband Solomon pancreatic cancer,  she Solomon noted some cognitive changes which have resulted in a few financial mistakes     Review of Symptoms  Patient denies headache, fevers, malaise, unintentional weight loss, skin rash, eye pain, sinus congestion and sinus pain, sore throat, dysphagia,  hemoptysis , cough, dyspnea, wheezing, chest pain, palpitations, orthopnea, edema, abdominal pain, nausea, melena, diarrhea, constipation, flank pain, dysuria, hematuria, urinary  Frequency, nocturia, numbness, tingling, seizures,  Focal weakness, Loss of consciousness,  Tremor, insomnia, depression, anxiety, and suicidal ideation.    Physical Exam:  BP 136/72   Pulse 64   Ht 5' 0.75 (1.543 m)   Wt 132 lb 12.8 oz (60.2 kg)   SpO2 98%   BMI 25.30 kg/m    Physical Exam Vitals reviewed.  Constitutional:      General: She is not in acute distress.    Appearance: Normal appearance. She is well-developed and  normal weight. She is not ill-appearing, toxic-appearing or diaphoretic.  HENT:     Head: Normocephalic.     Right Ear: Tympanic membrane, ear canal and external ear normal. There is no impacted cerumen.     Left Ear: Tympanic membrane, ear canal and external ear normal. There is no impacted cerumen.     Nose: Nose normal.     Mouth/Throat:     Mouth: Mucous membranes are moist.     Pharynx: Oropharynx is clear.  Eyes:     General: No scleral icterus.       Right  eye: No discharge.        Left eye: No discharge.     Conjunctiva/sclera: Conjunctivae normal.     Pupils: Pupils are equal, round, and reactive to light.  Neck:     Thyroid : No thyromegaly.     Vascular: No carotid bruit or JVD.  Cardiovascular:     Rate and Rhythm: Normal rate and regular rhythm.     Heart sounds: Normal heart sounds.  Pulmonary:     Effort: Pulmonary effort is normal. No respiratory distress.     Breath sounds: Normal breath sounds.  Chest:  Breasts:    Breasts are symmetrical.     Right: Normal. No swelling, inverted nipple, mass, nipple discharge, skin change or tenderness.     Left: Normal. No swelling, inverted nipple, mass, nipple discharge, skin change or tenderness.  Abdominal:     General: Bowel sounds are normal.     Palpations: Abdomen is soft. There is no mass.     Tenderness: There is no abdominal tenderness. There is no guarding or rebound.  Musculoskeletal:        General: Normal range of motion.     Cervical back: Normal range of motion and neck supple.  Lymphadenopathy:     Cervical: No cervical adenopathy.     Upper Body:     Right upper body: No supraclavicular, axillary or pectoral adenopathy.     Left upper body: No supraclavicular, axillary or pectoral adenopathy.  Skin:    General: Skin is warm and dry.  Neurological:     General: No focal deficit present.     Mental Status: She is alert and oriented to person, place, and time. Mental status is at baseline.  Psychiatric:        Mood and Affect: Mood normal.        Behavior: Behavior normal.        Thought Content: Thought content normal.        Judgment: Judgment normal.    Assessment and Plan: Encounter for screening mammogram for malignant neoplasm of breast -     3D Screening Mammogram, Left and Right; Future  Essential hypertension, benign -     Comprehensive metabolic panel  Hyperlipidemia with target LDL less than 130 -     Lipid panel -     LDL cholesterol,  direct  Other fatigue -     TSH  Impaired fasting glucose -     Comprehensive metabolic panel -     Hemoglobin A1c  Osteopenia after menopause Assessment & Plan: She Solomon been on an alendronate  holiday since Nov 2022.   repeat DEXA  in 2024 noted some bone loss ,  she Solomon also had bone loss in mandible and maxilla.  Discussed resuming nonbisphosphonate therapy with Evista  pending receipt of records from oral surgeon     Vitamin D  deficiency -     VITAMIN  D 25 Hydroxy (Vit-D Deficiency, Fractures)  History of adenomatous polyp of colon Assessment & Plan: 5 yr follow up is due in 2027 per 2022 colonoscopy (Wohl,  TA's found)   Family history of breast cancer Assessment & Plan: Annual mammogram was normal  June 2024 .  Discussed use of Evista  both for breast cancer prevention and osteoporosis .  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 She declines trial of antidepressant   Other orders -     Mupirocin ; Place 1 Application into the nose 2 (two) times daily. As needed  Dispense: 22 g; Refill: 5    No follow-ups on file.  Verneita LITTIE Kettering, MD

## 2023-11-01 LAB — VITAMIN D 25 HYDROXY (VIT D DEFICIENCY, FRACTURES): VITD: 40.75 ng/mL (ref 30.00–100.00)

## 2023-11-01 LAB — COMPREHENSIVE METABOLIC PANEL
ALT: 19 U/L (ref 0–35)
AST: 23 U/L (ref 0–37)
Albumin: 4.5 g/dL (ref 3.5–5.2)
Alkaline Phosphatase: 59 U/L (ref 39–117)
BUN: 12 mg/dL (ref 6–23)
CO2: 30 meq/L (ref 19–32)
Calcium: 9.5 mg/dL (ref 8.4–10.5)
Chloride: 103 meq/L (ref 96–112)
Creatinine, Ser: 0.74 mg/dL (ref 0.40–1.20)
GFR: 79.7 mL/min (ref >60.00)
Glucose, Bld: 87 mg/dL (ref 70–99)
Potassium: 4 meq/L (ref 3.5–5.1)
Sodium: 141 meq/L (ref 135–145)
Total Bilirubin: 0.5 mg/dL (ref 0.2–1.2)
Total Protein: 7.2 g/dL (ref 6.0–8.3)

## 2023-11-01 LAB — LIPID PANEL
Cholesterol: 209 mg/dL — ABNORMAL HIGH (ref 0–200)
HDL: 60.5 mg/dL (ref >39.00)
LDL Cholesterol: 128 mg/dL — ABNORMAL HIGH (ref 0–99)
NonHDL: 148.91
Total CHOL/HDL Ratio: 3
Triglycerides: 105 mg/dL (ref 0.0–149.0)
VLDL: 21 mg/dL (ref 0.0–40.0)

## 2023-11-01 LAB — TSH: TSH: 3.5 u[IU]/mL (ref 0.35–5.50)

## 2023-11-01 LAB — HEMOGLOBIN A1C: Hgb A1c MFr Bld: 5.7 % (ref 4.6–6.5)

## 2023-11-01 LAB — LDL CHOLESTEROL, DIRECT: Direct LDL: 124 mg/dL

## 2023-11-01 NOTE — Assessment & Plan Note (Addendum)
 5 yr follow up is due in 2027 per 2022 colonoscopy (Wohl,  TA's found)

## 2023-11-01 NOTE — Assessment & Plan Note (Signed)
Related to her husband's cognitive  and personality changes during treatment of pancreatic  cancer  . She declines trial of a ntidepressant

## 2023-11-01 NOTE — Assessment & Plan Note (Addendum)
 Annual mammogram was normal  June 2024 .  Discussed use of Evista both for breast cancer prevention and osteoporosis .

## 2023-11-02 MED ORDER — RALOXIFENE HCL 60 MG PO TABS: 60.0000 mg | ORAL_TABLET | Freq: Every day | ORAL | 3 refills | Status: DC

## 2023-11-02 NOTE — Addendum Note (Signed)
 Addended by: Sherlene Shams on: 11/02/2023 02:44 PM   Modules accepted: Orders

## 2023-11-03 ENCOUNTER — Encounter: Payer: Self-pay | Admitting: *Deleted

## 2023-12-05 ENCOUNTER — Telehealth: Payer: Self-pay | Admitting: Internal Medicine

## 2023-12-05 NOTE — Telephone Encounter (Signed)
What information was you requesting from pt's oral surgeon?

## 2023-12-05 NOTE — Telephone Encounter (Signed)
Copied from CRM [id number]. Topic: General - Other >> Dec 05, 2023  9:11 AM Gurney Maxin H wrote: Reason for CRM: Patient states oral surgeon called to notify her they received text message from Dr. Darrick Huntsman requesting information but office not sure what information she wants. Please give their office a call to verify what information Dr. Darrick Huntsman is requesting.   Dr. Kalman Jewels Oral Surgeon [phone]

## 2023-12-05 NOTE — Telephone Encounter (Signed)
Spoke with Dr. Elwyn Lade office and let them know what records we are needing. Tresa Endo stated that she will get them faxed over to [place] today.

## 2023-12-07 ENCOUNTER — Other Ambulatory Visit: Payer: Self-pay | Admitting: Internal Medicine

## 2024-01-16 ENCOUNTER — Ambulatory Visit (INDEPENDENT_AMBULATORY_CARE_PROVIDER_SITE_OTHER): Payer: PRIVATE HEALTH INSURANCE

## 2024-01-16 VITALS — BP 136/72 | HR 64 | Ht 60.0 in | Wt 132.0 lb

## 2024-01-16 DIAGNOSIS — Z0000 Encounter for general adult medical examination without abnormal findings: Secondary | ICD-10-CM | POA: Diagnosis not present

## 2024-01-16 NOTE — Patient Instructions (Signed)
 [name] , Thank you for taking time to come for your Medicare Wellness Visit. I appreciate your ongoing commitment to your health goals. Please review the following plan we discussed and let me know if I can assist you in the future.   This is a list of the screening recommended for you and due dates:  Health Maintenance  Topic Date Due   COVID-19 Vaccine (8 - Moderna risk 2024-25 season) 02/02/2024   Mammogram  04/02/2024   Medicare Annual Wellness Visit  01/15/2025   Colon Cancer Screening  01/05/2026   DTaP/Tdap/Td vaccine (3 - Td or Tdap) 12/13/2030   Pneumonia Vaccine  Completed   Flu Shot  Completed   DEXA scan (bone density measurement)  Completed   Hepatitis C Screening  Completed   Zoster (Shingles) Vaccine  Completed   HPV Vaccine  Aged Out    Advanced directives: (Declined) Advance directive discussed with you today. Even though you declined this today, please call our office should you change your mind, and we can give you the proper paperwork for you to fill out.  Next Medicare Annual Wellness Visit scheduled for next year: Yes

## 2024-01-16 NOTE — Progress Notes (Signed)
 Subjective:   [name] is a 73 y.o. who presents for a Medicare Wellness preventive visit.  Visit Complete: Virtual I connected with  Otho Darner on 01/16/24 by a audio enabled telemedicine application and verified that I am speaking with the correct person using two identifiers.  Patient Location: Home  Provider Location: Home Office  I discussed the limitations of evaluation and management by telemedicine. The patient expressed understanding and agreed to proceed.  Vital Signs: Because this visit was a virtual/telehealth visit, some criteria may be missing or patient reported. Any vitals not documented were not able to be obtained and vitals that have been documented are patient reported.  VideoDeclined- This patient declined Librarian, academic. Therefore the visit was completed with audio only.  Persons Participating in Visit: Patient.  AWV Questionnaire: No: Patient Medicare AWV questionnaire was not completed prior to this visit.  Cardiac Risk Factors include: advanced age (>19men, >23 women);hypertension;dyslipidemia     Objective:    Today's Vitals   01/16/24 1302  BP: 136/72  Pulse: 64  Weight: 132 lb (59.9 kg)  Height: 5' (1.524 m)   Body mass index is 25.78 kg/m.     01/16/2024    1:40 PM 12/05/2022    9:52 AM 04/27/2022    7:22 AM 11/18/2021    8:23 AM 01/05/2021    7:42 AM 11/17/2020    8:48 AM 06/10/2020   12:03 PM  Advanced Directives  Does Patient Have a Medical Advance Directive? Yes Yes Yes Yes Yes Yes Yes  Type of Special educational needs teacher of [place];Living will Healthcare Power of [place];Living will Healthcare Power of [place];Living will  Healthcare Power of [place];Living will Healthcare Power of [place];Living will  Does patient want to make changes to medical advance directive?  No - Patient declined No - Patient declined No - Patient declined  No - Patient declined No - Patient declined  Copy of  Healthcare Power of Attorney in Chart?  Yes - validated most recent copy scanned in chart (See row information) No - copy requested Yes - validated most recent copy scanned in chart (See row information)  Yes - validated most recent copy scanned in chart (See row information) Yes - validated most recent copy scanned in chart (See row information)    Current Medications (verified) Outpatient Encounter Medications as of 01/16/2024  Medication Sig   amLODipine (NORVASC) 2.5 MG tablet TAKE 1 TABLET BY MOUTH AT BEDTIME   Cyanocobalamin (B-12) 1000 MCG TABS    ezetimibe (ZETIA) 10 MG tablet TAKE ONE TABLET BY MOUTH ONE TIME DAILY   mupirocin ointment (BACTROBAN) 2 % Place 1 Application into the nose 2 (two) times daily. As needed   raloxifene (EVISTA) 60 MG tablet Take 1 tablet (60 mg total) by mouth daily.   triamcinolone (NASACORT) 55 MCG/ACT AERO nasal inhaler Place 2 sprays into the nose as needed.   vitamin E 200 UNIT capsule Take 200 Units by mouth daily.   doxycycline (VIBRA-TABS) 100 MG tablet Take 1 tablet (100 mg total) by mouth 2 (two) times daily. (Patient not taking: Reported on 09/27/2023)   EPINEPHrine (EPIPEN 2-PAK) 0.3 mg/0.3 mL IJ SOAJ injection Inject 0.3 mLs (0.3 mg total) into the muscle as needed for anaphylaxis. (Patient not taking: Reported on 10/31/2023)   No facility-administered encounter medications on file as of 01/16/2024.    Allergies (verified) Contrast media [iodinated contrast media], Iodine, Milk-related compounds, Prednisolone, Penicillins, and Sulfa antibiotics   History:  Past Medical History:  Diagnosis Date   Adenomatous polyps 2010   colon   Allergy    Arthritis    Cataract June & July 2021   Chicken pox    Complication of anesthesia    took a while to wake up from lithotripsy   GERD (gastroesophageal reflux disease)    Hyperlipidemia    Hypertension    Kidney stone    Lymphadenopathy, axillary 02/25/2020   Pneumonia    Past Surgical History:   Procedure Laterality Date   CATARACT EXTRACTION W/PHACO Left 05/13/2020   Procedure: CATARACT EXTRACTION PHACO AND INTRAOCULAR LENS PLACEMENT (IOC) LEFT;  Surgeon: Lockie Mola, MD;  Location: [facility];  Service: Ophthalmology;  Laterality: Left;  6.61 0:53.0 12.4%   CATARACT EXTRACTION W/PHACO Right 06/10/2020   Procedure: CATARACT EXTRACTION PHACO AND INTRAOCULAR LENS PLACEMENT (IOC) RIGHT VIVITY TORIC LENS;  Surgeon: Lockie Mola, MD;  Location: [facility];  Service: Ophthalmology;  Laterality: Right;  9.04 1:12.8 12.4%   COLONOSCOPY  2017   COLONOSCOPY WITH PROPOFOL N/A 01/05/2021   Procedure: COLONOSCOPY WITH PROPOFOL;  Surgeon: Midge Minium, MD;  Location: [facility];  Service: Endoscopy;  Laterality: N/A;   EXTRACORPOREAL SHOCK WAVE LITHOTRIPSY Left 03/28/2019   Procedure: EXTRACORPOREAL SHOCK WAVE LITHOTRIPSY (ESWL);  Surgeon: Vanna Scotland, MD;  Location: [facility];  Service: Urology;  Laterality: Left;   EYE SURGERY  Cataract surgery, L & R eyes   SPINE SURGERY     Family History  Problem Relation Age of Onset   Cancer Mother    Stroke Mother    Heart disease Mother    Hypertension Mother    Diabetes Mother    Breast cancer Mother 42   Heart disease Father    Hypertension Father    Breast cancer Sister    Cancer Sister    Hypertension Sister    Heart disease Brother    Hypertension Brother    Stroke Brother    Varicose Veins Sister    Social History   Socioeconomic History   Marital status: Married    Spouse name: Not on file   Number of children: Not on file   Years of education: Not on file   Highest education level: Bachelor's degree (e.g., BA, AB, BS)  Occupational History   Not on file  Tobacco Use   Smoking status: Never   Smokeless tobacco: Never  Vaping Use   Vaping status: Never Used  Substance and Sexual Activity   Alcohol use: Yes    Alcohol/week: 1.0 standard drink of alcohol    Types: 1 Glasses of  wine per week    Comment: Wine or beer occasionally   Drug use: No   Sexual activity: Yes    Birth control/protection: None  Other Topics Concern   Not on file  Social History Narrative   Not on file   Social Drivers of Health   Financial Resource Strain: Low Risk  (01/16/2024)   Overall Financial Resource Strain (CARDIA)    Difficulty of Paying Living Expenses: Not hard at all  Food Insecurity: No Food Insecurity (01/16/2024)   Hunger Vital Sign    Worried About Running Out of Food in the Last Year: Never true    Ran Out of Food in the Last Year: Never true  Transportation Needs: No Transportation Needs (01/16/2024)   PRAPARE - Administrator, Civil Service (Medical): No    Lack of Transportation (Non-Medical): No  Physical Activity: Insufficiently Active (01/16/2024)  Exercise Vital Sign    Days of Exercise per Week: 7 days    Minutes of Exercise per Session: 20 min  Stress: No Stress Concern Present (01/16/2024)   [employer] of Occupational Health - Occupational Stress Questionnaire    Feeling of Stress : Only a little  Social Connections: Moderately Integrated (01/16/2024)   Social Connection and Isolation Panel [NHANES]    Frequency of Communication with Friends and Family: More than three times a week    Frequency of Social Gatherings with Friends and Family: Once a week    Attends Religious Services: 1 to 4 times per year    Active Member of [employer] or Organizations: No    Attends Engineer, structural: Never    Marital Status: Married    Tobacco Counseling Counseling given: Yes    Clinical Intake:  Pre-visit preparation completed: Yes  Pain : No/denies pain     BMI - recorded: 25.78 Nutritional Status: BMI 25 -29 Overweight Nutritional Risks: None Diabetes: No  Lab Results  Component Value Date   HGBA1C 5.7 10/31/2023   HGBA1C 5.4 03/30/2023     How often do you need to have someone help you when you read instructions,  pamphlets, or other written materials from your doctor or pharmacy?: 1 - Never  Interpreter Needed?: No  Information entered by :: Alia T/CMA   Activities of Daily Living     01/16/2024    1:37 PM 01/15/2024    8:26 AM  In your present state of health, do you have any difficulty performing the following activities:  Hearing? 0 0  Vision? 0 0  Difficulty concentrating or making decisions? 0 0  Walking or climbing stairs? 1 0  Dressing or bathing? 0 0  Doing errands, shopping? 0 0  Preparing Food and eating ? N N  Using the Toilet? N N  In the past six months, have you accidently leaked urine? N Y  Do you have problems with loss of bowel control? N N  Managing your Medications? N N  Managing your Finances? N N  Housekeeping or managing your Housekeeping? N N    Patient Care Team: Sherlene Shams, MD as PCP - General (Internal Medicine)  Indicate any recent Medical Services you may have received from other than [facility] providers in the past year (date may be approximate).     Assessment:   This is a routine wellness examination for Coleta.  Hearing/Vision screen Hearing Screening - Comments:: Pt denies hearing def Vision Screening - Comments:: Pt denies vision def   Goals Addressed             This Visit's Progress    Follow up with Primary Care Provider   On track    As needed. Be more active with grandchildren.      Patient Stated       Change diet being healthy       Depression Screen     01/16/2024    1:16 PM 10/31/2023    3:44 PM 03/30/2023    8:03 AM 12/05/2022    9:46 AM 09/28/2022    8:17 AM 03/24/2022    9:30 AM 11/18/2021    8:26 AM  PHQ 2/9 Scores  PHQ - 2 Score 1 0 0 0 1 0 0  PHQ- 9 Score 3  4    0    Fall Risk     01/16/2024    1:15 PM 01/15/2024    8:26 AM  12/03/2023   12:22 PM 10/31/2023    3:44 PM 03/30/2023    8:03 AM  Fall Risk   Falls in the past year? 0 0 0 0 0  Number falls in past yr: 0   0 0  Injury with Fall? 0   0 0  Risk for fall due  to : No Fall Risks   No Fall Risks No Fall Risks  Follow up Falls prevention discussed;Falls evaluation completed   Falls evaluation completed Falls evaluation completed    MEDICARE RISK AT HOME:  Medicare Risk at Home Any stairs in or around the home?: (Patient-Rptd) Yes If so, are there any without handrails?: Yes Home free of loose throw rugs in walkways, pet beds, electrical cords, etc?: Yes Adequate lighting in your home to reduce risk of falls?: (Patient-Rptd) Yes Life alert?: (Patient-Rptd) No Use of a cane, walker or w/c?: Yes Grab bars in the bathroom?: (Patient-Rptd) Yes Shower chair or bench in shower?: (Patient-Rptd) Yes Elevated toilet seat or a handicapped toilet?: (Patient-Rptd) Yes  TIMED UP AND GO:  Was the test performed?  No  Cognitive Function: 6CIT completed    11/06/2019    9:00 AM 10/26/2017    2:09 PM 10/15/2015    8:41 AM  MMSE - Mini Mental State Exam  Not completed: Unable to complete    Orientation to time  5 5  Orientation to Place  5 5  Registration  3 3  Attention/ Calculation  5 5  Recall  3 3  Language- name 2 objects  2 2  Language- repeat  1 1  Language- follow 3 step command  3 3  Language- read & follow direction  1 1  Write a sentence  1 1  Copy design  1 1  Total score  30 30        01/16/2024    1:55 PM 12/05/2022   10:00 AM 10/31/2018    9:12 AM 10/12/2016    3:52 PM  6CIT Screen  What Year? 0 points 0 points 0 points 0 points  What month? 0 points 0 points 0 points 0 points  What time? 0 points 0 points 0 points 0 points  Count back from 20 0 points  0 points 0 points  Months in reverse 0 points 0 points 0 points 0 points  Repeat phrase 0 points 0 points 0 points   Total Score 0 points  0 points     Immunizations Immunization History  Administered Date(s) Administered   Fluad Quad(high Dose 65+) 06/24/2019, 08/15/2022   Influenza Split 07/26/2012, 08/27/2013   Influenza, High Dose Seasonal PF 08/25/2015, 08/15/2016,  07/15/2018, 07/06/2021   Influenza-Unspecified 08/29/2014, 08/01/2017, 08/07/2018, 07/22/2020, 08/29/2023   Moderna Covid-19 Fall Seasonal Vaccine 39yrs & older 08/04/2023   Moderna Sars-Covid-2 Vaccination 11/19/2019, 12/17/2019, 08/16/2020, 01/19/2021   Pfizer Covid-19 Vaccine Bivalent Booster 85yrs & up 07/06/2021, 08/01/2022   Pneumococcal Conjugate-13 09/02/2014   Pneumococcal Polysaccharide-23 06/27/2007, 10/07/2015   Respiratory Syncytial Virus Vaccine,Recomb Aduvanted(Arexvy) 07/15/2022   Tdap 12/25/2010, 12/13/2020   Zoster Recombinant(Shingrix) 12/11/2017, 04/21/2018   Zoster, Live 09/26/2013    Screening Tests Health Maintenance  Topic Date Due   COVID-19 Vaccine (8 - Moderna risk 2024-25 season) 02/02/2024   MAMMOGRAM  04/02/2024   Medicare Annual Wellness (AWV)  01/15/2025   Colonoscopy  01/05/2026   DTaP/Tdap/Td (3 - Td or Tdap) 12/13/2030   Pneumonia Vaccine 43+ Years old  Completed   INFLUENZA VACCINE  Completed   DEXA SCAN  Completed   Hepatitis C Screening  Completed   Zoster Vaccines- Shingrix  Completed   HPV VACCINES  Aged Out    Health Maintenance  There are no preventive care reminders to display for this patient.  Health Maintenance Items Addressed: See Nurse Notes  Additional Screening:  Vision Screening: Recommended annual ophthalmology exams for early detection of glaucoma and other disorders of the eye.  Dental Screening: Recommended annual dental exams for proper oral hygiene  Community Resource Referral / Chronic Care Management: CRR required this visit?  No   CCM required this visit?  No     Plan:     I have personally reviewed and noted the following in the patient's chart:   Medical and social history Use of alcohol, tobacco or illicit drugs  Current medications and supplements including opioid prescriptions. Patient is not currently taking opioid prescriptions. Functional ability and status Nutritional status Physical  activity Advanced directives List of other physicians Hospitalizations, surgeries, and ER visits in previous 12 months Vitals Screenings to include cognitive, depression, and falls Referrals and appointments  In addition, I have reviewed and discussed with patient certain preventive protocols, quality metrics, and best practice recommendations. A written personalized care plan for preventive services as well as general preventive health recommendations were provided to patient.     Arta Silence, CMA   01/16/2024   After Visit Summary: (MyChart) Due to this being a telephonic visit, the after visit summary with patients personalized plan was offered to patient via MyChart   Notes: Nothing significant to report at this time.

## 2024-01-29 DIAGNOSIS — Z961 Presence of intraocular lens: Secondary | ICD-10-CM | POA: Diagnosis not present

## 2024-01-29 DIAGNOSIS — H04123 Dry eye syndrome of bilateral lacrimal glands: Secondary | ICD-10-CM | POA: Diagnosis not present

## 2024-01-29 DIAGNOSIS — H43813 Vitreous degeneration, bilateral: Secondary | ICD-10-CM | POA: Diagnosis not present

## 2024-01-29 DIAGNOSIS — H35371 Puckering of macula, right eye: Secondary | ICD-10-CM | POA: Diagnosis not present

## 2024-02-02 DIAGNOSIS — Z23 Encounter for immunization: Secondary | ICD-10-CM | POA: Diagnosis not present

## 2024-02-08 ENCOUNTER — Encounter: Payer: Self-pay | Admitting: Internal Medicine

## 2024-03-11 ENCOUNTER — Ambulatory Visit
Admission: RE | Admit: 2024-03-11 | Discharge: 2024-03-11 | Disposition: A | Discharge status: Home / Self Care | Source: Ambulatory Visit | Attending: Urology | Admitting: Urology

## 2024-03-11 DIAGNOSIS — N200 Calculus of kidney: Secondary | ICD-10-CM

## 2024-03-12 ENCOUNTER — Ambulatory Visit (INDEPENDENT_AMBULATORY_CARE_PROVIDER_SITE_OTHER): Admitting: Urology

## 2024-03-12 ENCOUNTER — Encounter: Payer: Self-pay | Admitting: Urology

## 2024-03-12 VITALS — BP 144/75 | HR 73 | Ht 65.0 in | Wt 132.0 lb

## 2024-03-12 DIAGNOSIS — N200 Calculus of kidney: Secondary | ICD-10-CM | POA: Diagnosis not present

## 2024-03-12 NOTE — Progress Notes (Signed)
 03/12/2024 1:35 PM   [name] 1950/12/02 [id number]  Referring provider: Thersia Flax, MD [address] [place],  [place] [address]  Chief Complaint  Patient presents with   Nephrolithiasis    Urologic history: 1.  Bilateral nephrolithiasis Shockwave lithotripsy 03/2019 right distal ureteral calculus CT showed bilateral non-obstructing renal calculi with a small 2-3 mm calculus overlying the right lower pole and a cluster of non-obstructing left lower pole calculi, largest measuring approximately 5 mm. Stone analysis 85% CaOxMono Metabolic evaluation low urine volume 1.43 L and hypocitraturia Taking litholyte supplement  2.  Overactive bladder   HPI: 73 y.o. female presents for annual follow-up.   Since her last visit, she has had no recurrent renal colic and has done well. She has significantly increased her water intake and is also drinking homemade lemonade regularly.  Continues taking LithoLyte   PMH: Past Medical History:  Diagnosis Date   Adenomatous polyps 2010   colon   Allergy    Arthritis    Cataract June & July 2021   Chicken pox    Complication of anesthesia    took a while to wake up from lithotripsy   GERD (gastroesophageal reflux disease)    Hyperlipidemia    Hypertension    Kidney stone    Lymphadenopathy, axillary 02/25/2020   Pneumonia     Surgical History: Past Surgical History:  Procedure Laterality Date   CATARACT EXTRACTION W/PHACO Left 05/13/2020   Procedure: CATARACT EXTRACTION PHACO AND INTRAOCULAR LENS PLACEMENT (IOC) LEFT;  Surgeon: Annell Kidney, MD;  Location: [facility];  Service: Ophthalmology;  Laterality: Left;  6.61 0:53.0 12.4%   CATARACT EXTRACTION W/PHACO Right 06/10/2020   Procedure: CATARACT EXTRACTION PHACO AND INTRAOCULAR LENS PLACEMENT (IOC) RIGHT VIVITY TORIC LENS;  Surgeon: Annell Kidney, MD;  Location: [facility];  Service: Ophthalmology;  Laterality: Right;   9.04 1:12.8 12.4%   COLONOSCOPY  2017   COLONOSCOPY WITH PROPOFOL  N/A 01/05/2021   Procedure: COLONOSCOPY WITH PROPOFOL ;  Surgeon: Marnee Sink, MD;  Location: [facility];  Service: Endoscopy;  Laterality: N/A;   EXTRACORPOREAL SHOCK WAVE LITHOTRIPSY Left 03/28/2019   Procedure: EXTRACORPOREAL SHOCK WAVE LITHOTRIPSY (ESWL);  Surgeon: Dustin Gimenez, MD;  Location: [facility] OR[facility];  Service: Urology;  Laterality: Left;   EYE SURGERY  Cataract surgery, L & R eyes   SPINE SURGERY      Home Medications:  Allergies as of 03/12/2024       Reactions   Contrast Media [iodinated Contrast Media] Swelling   Iodine Swelling   Milk-related Compounds Diarrhea   Prednisolone Anxiety   Agitation to oral prednisone  tapers    Penicillins    Childhood does not know reaction   Sulfa Antibiotics Hives        Medication List        Accurate as of Mar 12, 2024  1:35 PM. If you have any questions, ask your nurse or doctor.          STOP taking these medications    doxycycline  100 MG tablet Commonly known as: VIBRA -TABS   EPINEPHrine  0.3 mg/0.3 mL Soaj injection Commonly known as: EpiPen  2-Pak       TAKE these medications    amLODipine  2.5 MG tablet Commonly known as: NORVASC  TAKE 1 TABLET BY MOUTH AT BEDTIME   B-12 1000 MCG Tabs   ezetimibe  10 MG tablet Commonly known as: ZETIA  TAKE ONE TABLET BY MOUTH ONE TIME DAILY   hydrochlorothiazide 50 MG tablet Commonly known  as: HYDRODIURIL Take 50 mg by mouth daily.   mupirocin  ointment 2 % Commonly known as: BACTROBAN  Place 1 Application into the nose 2 (two) times daily. As needed   raloxifene  60 MG tablet Commonly known as: EVISTA  Take 1 tablet (60 mg total) by mouth daily.   triamcinolone 55 MCG/ACT Aero nasal inhaler Commonly known as: NASACORT Place 2 sprays into the nose as needed.   vitamin E 200 UNIT capsule Take 200 Units by mouth daily.        Allergies:  Allergies  Allergen Reactions   Contrast Media  [Iodinated Contrast Media] Swelling   Iodine Swelling   Milk-Related Compounds Diarrhea   Prednisolone Anxiety    Agitation to oral prednisone  tapers    Penicillins     Childhood does not know reaction   Sulfa Antibiotics Hives    Family History: Family History  Problem Relation Age of Onset   Cancer Mother    Stroke Mother    Heart disease Mother    Hypertension Mother    Diabetes Mother    Breast cancer Mother 69   Heart disease Father    Hypertension Father    Breast cancer Sister    Cancer Sister    Hypertension Sister    Heart disease Brother    Hypertension Brother    Stroke Brother    Varicose Veins Sister     Social History:  reports that she has never smoked. She has never used smokeless tobacco. She reports current alcohol use of about 1.0 standard drink of alcohol per week. She reports that she does not use drugs.   Physical Exam: BP (!) 144/75   Pulse 73   Ht 5\' 5"  (1.651 m)   Wt 132 lb (59.9 kg)   BMI 21.97 kg/m   Constitutional:  Alert and oriented, No acute distress. HEENT: [place] AT Respiratory: Normal respiratory effort, no increased work of breathing.   Pertinent Imaging: Images of a KUB performed today were personally reviewed and interpreted.  There is a moderate amount off stool and bowel gas obscuring the right renal outline.  The right renal calculus is not visualized.  Left lower pole calcifications are stable compared with KUB May 2024   Assessment & Plan:    1.  Bilateral nephrolithiasis Stable, remains asymptomatic Desires to continue surveillance and will follow-up in 1 year with KUB   Geraline Knapp, MD  [facility] [address], [address] [place], [place] [address] [phone]

## 2024-03-14 ENCOUNTER — Ambulatory Visit: Payer: Medicare Other | Admitting: Urology

## 2024-03-15 ENCOUNTER — Ambulatory Visit: Payer: Self-pay | Admitting: Urology

## 2024-04-03 ENCOUNTER — Ambulatory Visit
Admission: RE | Admit: 2024-04-03 | Discharge: 2024-04-03 | Disposition: A | Discharge status: Home / Self Care | Source: Ambulatory Visit | Attending: Internal Medicine | Admitting: Internal Medicine

## 2024-04-03 DIAGNOSIS — Z1231 Encounter for screening mammogram for malignant neoplasm of breast: Secondary | ICD-10-CM | POA: Diagnosis not present

## 2024-05-29 ENCOUNTER — Other Ambulatory Visit: Payer: Self-pay | Admitting: Internal Medicine

## 2024-06-18 ENCOUNTER — Ambulatory Visit: Payer: Self-pay

## 2024-06-18 NOTE — Telephone Encounter (Signed)
 FYI Only or Action Required?: FYI only for provider.  Patient was last seen in primary care on 10/31/2023 by Marylynn Verneita CROME, MD.  Called Nurse Triage reporting Rash.  Symptoms began several weeks ago.  Interventions attempted: OTC medications: A&D Rash Cream.  Symptoms are: unchanged.  Triage Disposition: See PCP When Office is Open (Within 3 Days)  Patient/caregiver understands and will follow disposition?: YesCopied from CRM #[id number]. Topic: Clinical - Red Word Triage >> Jun 18, 2024  9:58 AM Mesmerise C wrote: [facility] that prompted transfer to Nurse Triage: Patient stated she has a rash on belly and chest believed it was chiggers when gardening about a month ago, been spreading to her neck, shoulders, and arms no improvement with medication Reason for Disposition  Mild widespread rash  (Exception: Heat rash lasting 3 days or less.)  Answer Assessment - Initial Assessment Questions Stated she noticed a bite on her L side abdomen in mid July, slowly progressing. Last week has been getting worse. A&D medicated rash ointment 2-2.5 hours of relief then itchiness starts again. Very itchy at night.   1. CALLER DIAGNOSIS: What do you think is causing the rash? (e.g., athlete's foot, chickenpox, hives, impetigo)     Thought it was chiggers, now unknown  2. LOCALIZED OR WIDESPREAD:  Is the rash all over (widespread) or mostly just in one area of the body (localized)?      Started on arms from elbow to wrist, now neck to left shoulder, L side above naval.   3. NEW MEDICINES: Are you taking any new medicine?     No   4. APPEARANCE of RASH: What does the rash look like? What color is it?  Note: It is difficult to assess rash color in people with darker-colored skin. When this situation occurs, simply ask the caller to describe what they see.     Raised red spots  5. FEVER: Do you have a fever? If Yes, ask: What is your temperature, how was it measured, and when did it start?      No  Answer Assessment - Initial Assessment Questions 1. APPEARANCE of RASH: What does the rash look like? (e.g., blisters, dry flaky skin, red spots, redness, sores)     *No Answer* 2. SIZE: How big are the spots? (e.g., tip of pen, eraser, coin; inches, centimeters)     *No Answer* 3. LOCATION: Where is the rash located?     *No Answer* 4. COLOR: What color is the rash? (Note: It is difficult to assess rash color in people with darker-colored skin. When this situation occurs, simply ask the caller to describe what they see.)     *No Answer* 5. ONSET: When did the rash begin?     *No Answer* 6. FEVER: Do you have a fever? If Yes, ask: What is your temperature, how was it measured, and when did it start?     *No Answer* 7. ITCHING: Does the rash itch? If Yes, ask: How bad is the itch? (Scale 1-10; or mild, moderate, severe)     *No Answer* 8. CAUSE: What do you think is causing the rash?     *No Answer* 9. MEDICINE FACTORS: Have you started any new medicines within the last 2 weeks? (e.g., antibiotics)      *No Answer* 10. OTHER SYMPTOMS: Do you have any other symptoms? (e.g., dizziness, headache, sore throat, joint pain)       *No Answer* 11. PREGNANCY: Is there any chance you are pregnant?  When was your last menstrual period?       *No Answer*  Protocols used: Rash - Guideline Selection-A-AH, Rash or Redness - [facility]

## 2024-06-21 ENCOUNTER — Ambulatory Visit

## 2024-06-21 VITALS — BP 122/82 | HR 88 | Temp 99.1°F | Ht 61.5 in | Wt 130.0 lb

## 2024-06-21 DIAGNOSIS — L282 Other prurigo: Secondary | ICD-10-CM

## 2024-06-21 MED ORDER — HYDROXYZINE PAMOATE 25 MG PO CAPS: 25.0000 mg | ORAL_CAPSULE | Freq: Every day | ORAL | 0 refills | Status: AC | PRN

## 2024-06-21 MED ORDER — EPINEPHRINE 0.3 MG/0.3ML IJ SOAJ: 0.3000 mg | INTRAMUSCULAR | 0 refills | Status: AC | PRN

## 2024-06-21 MED ORDER — TRIAMCINOLONE ACETONIDE 0.1 % EX CREA: 1.0000 | TOPICAL_CREAM | Freq: Two times a day (BID) | CUTANEOUS | 0 refills | Status: DC

## 2024-06-21 NOTE — Progress Notes (Signed)
 Acute Office Visit  Subjective:    Patient ID: [name], female    DOB: 01/08/1951, 73 y.o.   MRN: [id number]  Chief Complaint  Patient presents with   Rash   Patient is in today for following acute concerns:   HPI Discussed the use of AI scribe software for clinical note transcription with the patient, who gave verbal consent to proceed.  History of Present Illness [name] is a 73 year old female who presents with a widespread itchy rash following a suspected insect bite.  In mid-July, she was gardening and noticed a bite on the left side of her abdomen about a week later. She began treating it with hydrocortisone cream twice daily, A&D cream prn.  Despite this treatment, the rash began to spread in August, moving from her chest to under her bra, and eventually spreading to her neck, ears, left shoulder, and down her arm to her hand. The rash is described as very itchy and worsens with heat exposure.  She has a history of a negative reaction to prednisone , which caused mood changes and anger. She has also experienced an allergic reaction to iodine in the past, resulting in swollen eyes immediately after exposure. 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 She is cautious when working in her yard, wearing long sleeves and using bug spray. She has not been hospitalized for anaphylactic reactions but has been prescribed an EpiPen  in the past due to a severe reaction to iodine.   She reports a history of dairy allergy, which causes severe abdominal pain and diarrhea, sometimes leading to syncope. She avoids dairy and is cautious about food at restaurants..  She is sensitive to medications and rarely takes Tylenol  or other medications. She has a history of kidney stones and felt overmedicated during treatment. She is cautious about medication use due to past experiences of errors in her medical chart, such as being  incorrectly noted for lithium treatment.  ROS As per HPI    Objective:    BP 122/82 (BP Location: Right Arm, Patient Position: Sitting, Cuff Size: Normal)   Pulse 88   Temp 99.1 F (37.3 C) (Oral)   Ht 5' 1.5 (1.562 m)   Wt 130 lb (59 kg)   SpO2 98%   BMI 24.17 kg/m    Physical Exam Constitutional:      General: She is not in acute distress.    Appearance: Normal appearance.  HENT:     Head: Normocephalic and atraumatic.     Right Ear: Tympanic membrane normal.     Left Ear: Tympanic membrane normal.     Nose: No congestion.     Mouth/Throat:     Mouth: Mucous membranes are moist.     Pharynx: Oropharynx is clear.  Neck:     Thyroid : No thyroid  mass or thyroid  tenderness.  Cardiovascular:     Rate and Rhythm: Normal rate and regular rhythm.  Pulmonary:     Effort: Pulmonary effort is normal.     Breath sounds: Normal breath sounds. No wheezing or rales.  Abdominal:     General: Bowel sounds are normal.     Palpations: Abdomen is soft.     Tenderness: There is no guarding.  Musculoskeletal:     Cervical back: Neck supple. No rigidity.     Right lower leg: No edema.     Left lower leg:  No edema.  Skin:    General: Skin is warm.     Findings: Rash (diffuse, erythematous, finely papular lesions with confluent pattern. No obvious vesiculation, pustulation or scaling) present.  Neurological:     Mental Status: She is alert and oriented to person, place, and time.  Psychiatric:        Mood and Affect: Mood normal.        Behavior: Behavior normal.        No results found for any visits on 06/21/24.     Assessment & Plan:  Patient presents of evaluation of rash. Rash morphology, distribution suggestive of miliaria rubra, but other differentials includes viral exanthems, allergic contact dermatitis, arthropod bite. Of note prescribed hydrochlorothiazide in the past but has not been taking this medication. Does not recall who prescribed it or when this was  prescribed. Hydrochlorothiazide may have been prescribed by her urologist to reduce risk of nephrolithiasis. Defer to PCP for HTN and chronic disease management.   Pruritic rash Assessment & Plan: Has tried OTC hydrocortisone cream without significant improvement, recommend Kenalog  (triamcinolone ) cream, twice a day for up to 14 days on eruptions. Not a candidate for oral steroid due to h/o mood change on prednisone  previously).   To help with pruritus recommend hydroxyzine  25 mg once daily prn (only use for severe pruritus, to be taken as needed, with caution due to potential drowsiness).  Prescribe EpiPen  for emergency use in case of anaphylactic symptoms such as difficulty breathing, swelling, or widespread rash. ED evaluation if above symptoms were to occur.   Advise avoiding sun exposure and using gentle, fragrance-free skincare products.  Recommend calamine lotion and cooling techniques, such as ice packs wrapped in cloth, to alleviate symptoms. Avoid excessive heat, humidity, wear breathable clothing.   Instruct to follow up with primary care physician or dermatologist if symptoms do not improve or worsen. Advise cutting nails short to prevent scratching during sleep. Recommend taking ibuprofen with food if needed for pain, but avoid other NSAIDs.  Orders: -     Triamcinolone  Acetonide; Apply 1 Application topically 2 (two) times daily.  Dispense: 30 g; Refill: 0 -     EPINEPHrine ; Inject 0.3 mg into the muscle as needed for anaphylaxis.  Dispense: 1 each; Refill: 0 -     hydrOXYzine  Pamoate; Take 1 capsule (25 mg total) by mouth daily as needed.  Dispense: 15 capsule; Refill: 0    Return if symptoms worsen or fail to improve.  Luke Shade, MD

## 2024-06-21 NOTE — Assessment & Plan Note (Addendum)
 Has tried OTC hydrocortisone cream without significant improvement, recommend Kenalog  (triamcinolone ) cream, twice a day for up to 14 days on eruptions. Not a candidate for oral steroid due to h/o mood change on prednisone  previously).   To help with pruritus recommend hydroxyzine  25 mg once daily prn (only use for severe pruritus, to be taken as needed, with caution due to potential drowsiness).  Prescribe EpiPen  for emergency use in case of anaphylactic symptoms such as difficulty breathing, swelling, or widespread rash. ED evaluation if above symptoms were to occur.   Advise avoiding sun exposure and using gentle, fragrance-free skincare products.  Recommend calamine lotion and cooling techniques, such as ice packs wrapped in cloth, to alleviate symptoms. Avoid excessive heat, humidity, wear breathable clothing.   Instruct to follow up with primary care physician or dermatologist if symptoms do not improve or worsen. Advise cutting nails short to prevent scratching during sleep. Recommend taking ibuprofen with food if needed for pain, but avoid other NSAIDs.

## 2024-06-21 NOTE — Patient Instructions (Addendum)
-   Reduce Itching and Swelling Apply a cold compress: Place a clean, cool, damp cloth or an ice pack (wrapped in a towel) on the rash for 10-15 minutes at a time, several times a day.  - Use anti-itch creams:      Use steroid cream/ kenalog   to the rash, twice a day for 7-14 days. Since you have had history of reaction to oral Prednisone  I do not recommend trying oral steroid.      You can also try calamine lotion on the bites 1-2 times daily to help with itching.     Try oral allergy medicine: Hydroxyzine  25 mg, every 8 hourly as needed to help with itchiness. This medication can make you tired, sleepy so be careful with driving. Only take it for extreme itchiness.   - Avoid Scratching Try not to scratch the bites, as this can make them worse and increase the risk of infection. Keep fingernails short and clean.  - Pain Relief If the bite is painful, you may use acetaminophen  (Tylenol ) or ibuprofen (Advil, take it with food, no more than every 8 hourly) as needed.   - When to Seek Immediate Help Go to the emergency room or call 911 if you have (use epi pen as soon as the following happens): Trouble breathing or swallowing Swelling of your lips, tongue, or face Dizziness or fainting  - Prevent Future Bites Use insect repellent when outdoors. Wear long sleeves and pants in areas with many insects. Keep windows and doors closed or use screens.

## 2024-06-26 ENCOUNTER — Ambulatory Visit

## 2024-06-26 DIAGNOSIS — R21 Rash and other nonspecific skin eruption: Secondary | ICD-10-CM | POA: Diagnosis not present

## 2024-06-26 MED ORDER — CLOBETASOL PROPIONATE 0.05 % EX OINT: TOPICAL_OINTMENT | CUTANEOUS | 5 refills | Status: AC

## 2024-06-26 MED ORDER — TRIAMCINOLONE ACETONIDE 0.1 % EX OINT: TOPICAL_OINTMENT | CUTANEOUS | 2 refills | Status: DC

## 2024-06-26 NOTE — Patient Instructions (Signed)
 - Start Triamcinolone  0.1% ointment twice daily to affected areas of skin. Stop once resolved and restart as needed for flares. Avoid use on face, armpits, groin unless otherwise indicated. - For worse stubborn areas start Clobetasol  to affected area of skin twice daily. Stop once resolved and restart as needed for flares. Avoid use on face, armpits, groin unless otherwise indicated.   Steroid Use  We prescribed you a topical steroid at today's visit.   General application instructions: -Apply this to any affected skin areas, twice (2 times) daily, for two (2) weeks -If the areas are better, you can stop -Re-start the topical steroid if the areas come back, or flare -If the areas don't get better after two weeks, we sometimes recommend taking a break for one (1) week, before restarting for another two (2) weeks. Repeat as needed  The most common side effects of topical steroid medications include changes in skin pigment and thinning of the skin. If the steroid is only applied to affected areas of the skin, these effects rarely occur unless the steroid is used for a very long time (years without stopping).   If we prescribed you a strong steroid, please avoid applying to face, groin, or neck, unless we tell you otherwise. We will include more detail in your prescription instructions.    Skin Care and Sun Protection  Your skin plays an important role in keeping the entire body healthy. Below are some tips on how to try and maximize skin health from the outside in.  Bathing  Bathe in mildly warm water every 1 to 2 days, followed by light drying and an application of a thick moisturizer cream or ointment, preferably one that comes in a tub.  Recommended body soaps/washes: - Cerave Hydrating Cleanser Bar - Dove Sensitive Skin Fragrance Free Beauty Bar - Aveeno Active Naturals Skin Relief Body Wash, Fragrance Free - Free & Clear (vanicream) liquid cleanser  Moisturizer  Body  moisturizer: Apply a moisturizer throughout the day and after bathing.  When you moisturize after bathing, this locks in the moisture.  This can lead to softer and smoother skin.  Body moisturizers come in ointments, creams, and lotions.  If you have dry skin, we recommend the use of ointments or creams rather than lotions.  In other words, something you scoop out of a jar rather than squirted out.  Ointments and creams are thicker and thus provide better moisturization.    Recommended creams for all over: - Vanicream cream - CeraVe Moisturizing Cream - Eucerin Original Healing Soothing Repair Cream  Recommended ointments: greasy, but do the best job at moisturization - Plain Vaseline (petroleum jelly) - CeraVe Healing ointment - Aquaphor Healing ointment  Face moisturizers: For your face, look for something that is labeled as non-comedogenic (won't clog pores) and oil-free. Your moisturizer for the day should have SPF 30 or higher in it as well, but your moisturizer for night can be without SPF. Some good examples are: - CeraVe Moisturizing Cream (can be used as a face moisturizer) - La Roche-Posay Toleriane Double Repair Facial Moisturizer with SPF 30 (my favorite for day time) - CeraVe AM (has SPF 30) - CeraVe PM  Sunscreen  Who needs sunscreen? Everyone. Sunscreen use can help prevent skin cancer by protecting you from the sun's harmful ultraviolet rays. Anyone can get skin cancer, regardless of age, gender or race. In fact, it is estimated that one in five Americans will develop skin cancer in their lifetime.  Sunscreen alone cannot  fully protect you. In addition to wearing sunscreen, dermatologists recommend taking the following steps to protect your skin and find skin cancer early:  Seek shade when appropriate, remembering that the sun's rays are strongest between 10 a.m. and 2 p.m. If your shadow is shorter than you are, seek shade. Dress to protect yourself from the sun by wearing a  lightweight long-sleeved shirt, pants, a wide-brimmed hat and sunglasses, when possible.  Use extra caution near water, snow and sand as they reflect the damaging rays of the sun, which can increase your chance of sunburn.  Get vitamin D  safely through a healthy diet that may include vitamin supplements. Don't seek the sun. Avoid tanning beds. Ultraviolet light from the sun and tanning beds can cause skin cancer and wrinkling. If you want to look tan, you may wish to use a self-tanning product, but continue to use sunscreen with it.  When should I use sunscreen? Every day you go outside--even if you're just walking to and from your form of transportation. The sun emits harmful UV rays year-round. Even on cloudy days, up to 80 percent of the sun's harmful UV rays can penetrate your skin. Snow, sand and water increase the need for sunscreen because they reflect the sun's rays.  How much sunscreen should I use, and how often should I apply it? Most people only apply 25-50 percent of the recommended amount of sunscreen. Apply enough sunscreen to cover all exposed skin. Most adults need about 1 ounce -- or enough to fill a shot glass -- to fully cover their body.  Don't forget to apply to the tops of your feet, your neck, your ears and the top of your head. Apply sunscreen to dry skin 15 minutes before going outdoors.  Skin cancer also can form on the lips. To protect your lips, apply a lip balm or lipstick that contains sunscreen with an SPF of 30 or higher.  When outdoors, reapply sunscreen approximately every two hours, or after swimming or sweating, according to the directions on the bottle.   Broad-spectrum sunscreens protect against both UVA and UVB rays. What is the difference between the rays? Sunlight consists of two types of harmful rays that reach the earth -- UVA rays and UVB rays. Overexposure to either can lead to skin cancer. In addition to causing skin cancer, here's what each of these rays  do:  UVA rays (or aging rays) can prematurely age your skin, causing wrinkles and age spots, and can pass through window glass. UVB rays (or burning rays) are the primary cause of sunburn and are blocked by window glass  There is no safe way to tan. Every time you tan, you damage your skin. As this damage builds, you speed up the aging of your skin and increase your risk for all types of skin cancer.  What is the difference between chemical and physical sunscreens? Chemical sunscreens work like a sponge, absorbing the sun's rays. They contain one or more of the following active ingredients: oxybenzone, avobenzone, octisalate, octocrylene, homosalate and octinoxate. These formulations tend to be easier to rub into the skin without leaving a white residue.   Physical sunscreens work like a shield, sitting sit on the surface of your skin and deflecting the sun's rays. They contain the active ingredients zinc oxide and/or titanium dioxide. Use this sunscreen if you have sensitive skin.   What type of sunscreen should I use? The best type of sunscreen is the one you will use again and  again. Just make sure it offers broad-spectrum (UVA and UVB) protection, has an SPF of 30+, and is water-resistant. The kind of sunscreen you use is a matter of personal choice, and may vary depending on the area of the body to be protected. Available sunscreen options include lotions, creams, gels, ointments, wax sticks and sprays.  Recommended physical sunscreens for face: - Neutrogena Sheer Zinc - Aveeno Positively Mineral Sensitive - CeraVe Hydrating Mineral (also has a tinted version) - La Roche-Posay Anthelios Mineral Face (comes as a cream, lotion, light fluid, and there is also a tinted version).  - EltaMD UV Clear (also has a tinted version)  Recommended physical sunscreens for body: - Neutrogena Sheer Zinc Dry-Touch Sunscreen Sensitive Skin Lotion Broad Spectrum SPF 50 - Aveeno Positively Mineral Sensitive  Skin Sunscreen Broad Spectrum SPF 50 - La Roche-Posay Anthelios SPF 50 Mineral Sunscreen - Gentle Lotion - CeraVe Hydrating Mineral Sunscreen SPF 50  Recommended chemical sunscreens for face: - Anthelios UV Correct Face Sunscreen SPF 70 with Niacinamide - Neutrogena Clear Face Oil-Free SPF 50 with Helioplex - Neutrogena Sport Face Oil-Free SPF 70+ with Helioplex - Aveeno Protect + Hydrate Sunscreen For Face SPF 70 - La Roche-Posay Anthelios Light Fluid Sunscreen for Face SPF 60  Recommended chemical sunscreens for body: - Neutrogena Ultra Sheer Dry-Touch Sunscreen SPF 70 - Aveeno Protect + Hydrate Broad Spectrum All-Day Hydration SPF 60 (comes in a big pump) - La Roche-Posay Anthelios Melt-In Milk Sunscreen SPF 60  Recommended UPF Clothing - Coolibar  - Solbari  - Wallaroo hats  - Materials engineer (On [employer])     Due to recent changes in healthcare laws, you may see results of your pathology and/or laboratory studies on MyChart before the doctors have had a chance to review them. We understand that in some cases there may be results that are confusing or concerning to you. Please understand that not all results are received at the same time and often the doctors may need to interpret multiple results in order to provide you with the best plan of care or course of treatment. Therefore, we ask that you please give us  2 business days to thoroughly review all your results before contacting the office for clarification. Should we see a critical lab result, you will be contacted sooner.   If You Need Anything After Your Visit  If you have any questions or concerns for your doctor, please call our main line at [phone] and press option 4 to reach your doctor's medical assistant. If no one answers, please leave a voicemail as directed and we will return your call as soon as possible. Messages left after 4 pm will be answered the following business day.   You may also send us  a message via MyChart.  We typically respond to MyChart messages within 1-2 business days.  For prescription refills, please ask your pharmacy to contact our office. Our fax number is [phone].  If you have an urgent issue when the clinic is closed that cannot wait until the next business day, you can page your doctor at the number below.    Please note that while we do our best to be available for urgent issues outside of office hours, we are not available 24/7.   If you have an urgent issue and are unable to reach us , you may choose to seek medical care at your doctor's office, retail clinic, urgent care center, or emergency room.  If you have a medical emergency, please immediately call 911  or go to the emergency department.  Pager Numbers  - Dr. Hester: [phone]  - Dr. Jackquline: [phone]  - Dr. Claudene: [phone]   - Dr. Raymund: [phone]  In the event of inclement weather, please call our main line at [phone] for an update on the status of any delays or closures.  Dermatology Medication Tips: Please keep the boxes that topical medications come in in order to help keep track of the instructions about where and how to use these. Pharmacies typically print the medication instructions only on the boxes and not directly on the medication tubes.   If your medication is too expensive, please contact our office at [phone] option 4 or send us  a message through MyChart.   We are unable to tell what your co-pay for medications will be in advance as this is different depending on your insurance coverage. However, we may be able to find a substitute medication at lower cost or fill out paperwork to get insurance to cover a needed medication.   If a prior authorization is required to get your medication covered by your insurance company, please allow us  1-2 business days to complete this process.  Drug prices often vary depending on where the prescription is filled and some pharmacies may  offer cheaper prices.  The website www.goodrx.com contains coupons for medications through different pharmacies. The prices here do not account for what the cost may be with help from insurance (it may be cheaper with your insurance), but the website can give you the price if you did not use any insurance.  - You can print the associated coupon and take it with your prescription to the pharmacy.  - You may also stop by our office during regular business hours and pick up a GoodRx coupon card.  - If you need your prescription sent electronically to a different pharmacy, notify our office through [facility] or by phone at [phone] option 4.     Si Usted Necesita Algo Despus de Su Visita  Tambin puede enviarnos un mensaje a travs de Clinical cytogeneticist. Por lo general respondemos a los mensajes de MyChart en el transcurso de 1 a 2 das hbiles.  Para renovar recetas, por favor pida a su farmacia que se ponga en contacto con nuestra oficina. Randi lakes de fax es [place] [phone].  Si tiene un asunto urgente cuando la clnica est cerrada y que no puede esperar hasta el siguiente da hbil, puede llamar/localizar a su doctor(a) al nmero que aparece a continuacin.   Por favor, tenga en cuenta que aunque hacemos todo lo posible para estar disponibles para asuntos urgentes fuera del horario de [place], no estamos disponibles las 24 horas del da, los 7 [address]  [address] de la [place].   Si tiene un problema urgente y no puede comunicarse con nosotros, puede optar por buscar atencin mdica  en el consultorio de su doctor(a), en una clnica privada, en un centro de atencin urgente o en una sala de emergencias.  Si tiene Engineer, drilling, por favor llame inmediatamente al 911 o vaya a la sala de emergencias.  Nmeros de bper  - Dr. Hester: [phone]  - Dra. Jackquline: [phone]  - Dr. Claudene: [phone]  - Dra. Kitts: [phone]  En caso de inclemencias del [place], por favor llame  a nuestra lnea principal al [phone] para una actualizacin sobre el estado de cualquier retraso o cierre.  Consejos para la medicacin en dermatologa: Por favor, guarde las cajas en las que vienen los medicamentos  de uso tpico para ayudarle a seguir las [employer] dnde y cmo usarlos. Las farmacias generalmente imprimen las instrucciones del medicamento slo en las cajas y no directamente en los tubos del [place].   Si su medicamento es muy caro, por favor, pngase en contacto con landry rieger llamando al [phone] y presione la opcin 4 o envenos un mensaje a travs de Clinical cytogeneticist.   No podemos decirle cul ser su copago por los medicamentos por adelantado ya que esto es diferente dependiendo de la cobertura de su seguro. Sin embargo, es posible que podamos encontrar un medicamento sustituto a Audiological scientist un formulario para que el seguro cubra el medicamento que se considera necesario.   Si se requiere una autorizacin previa para que su compaa de seguros malta su medicamento, por favor permtanos de 1 a 2 das hbiles para completar este proceso.  Los precios de los medicamentos varan con frecuencia dependiendo del Environmental consultant de dnde se surte la receta y alguna farmacias pueden ofrecer precios ms baratos.  El sitio web www.goodrx.com tiene cupones para medicamentos de Health and safety inspector. Los precios aqu no tienen en cuenta lo que podra costar con la ayuda del seguro (puede ser ms barato con su seguro), pero el sitio web puede darle el precio si no utiliz Tourist information centre manager.  - Puede imprimir el cupn correspondiente y llevarlo con su receta a la farmacia.  - Tambin puede pasar por nuestra oficina durante el horario de atencin regular y Education officer, museum una tarjeta de cupones de GoodRx.  - Si necesita que su receta se enve electrnicamente a una farmacia diferente, informe a nuestra oficina a travs de MyChart de [place] o por telfono llamando al [phone] y  presione la opcin 4.

## 2024-06-26 NOTE — Progress Notes (Signed)
   Follow Up Visit   Subjective  [name] is a 73 y.o. female who presents for the following: Rash on torso, up to neck. Since mid July. Noticed after working out in her [employer] garden. Spreading. Thinks started after bug bite.  Triamcinolone  0.1% cream, prescribed by PCP, helps with the itching but still continuing to get new areas. Using twice daily since June 21, 2024. Itching, burns at times.  No new soaps, lotions, laundry detergent. No new medications. Has been on amlodipine  for several years.   The following portions of the chart were reviewed this encounter and updated as appropriate: medications, allergies, medical history  Review of Systems:  No other skin or systemic complaints except as noted in HPI or Assessment and Plan.  Objective  Well appearing patient in no apparent distress; mood and affect are within normal limits.  A focused examination was performed of the following areas: Torso, face, neck  Erythematous scaly plaques of chest Xerosis, mild erythema of arms    Assessment & Plan     Rash of uncertain etiology Ddx: New onset eczema vs ID reaction (started after bug bite) vs dermal hypersensitivity vs less likely CTCL  - Undiagnosed new problem with uncertain prognosis  - Rash improving today after 3 days of triamcinolone . Will continue and add on clobetasol .  If not improved at next visit can consider bx. Discussed can consider alternate BP med given amlodipine 's association w/ eczema  - Differential diagnosis, treatment options, prognosis, risk/ benefit, and side effects of treatment were discussed with the patient.  - Start Triamcinolone  0.1% ointment twice daily to affected areas of skin. Stop once resolved and restart as needed for flares. Avoid use on face, armpits, groin unless otherwise indicated. - For worse stubborn areas start Clobetasol  0.05% ointment to affected area of skin twice daily. Stop once resolved and restart as needed for flares.  Avoid use on face, armpits, groin unless otherwise indicated.   Topical steroids (such as triamcinolone , fluocinolone, fluocinonide, mometasone, clobetasol , halobetasol, betamethasone, hydrocortisone) can cause thinning and lightening of the skin if they are used for too long in the same area. Your physician has selected the right strength medicine for your problem and area affected on the body. Please use your medication only as directed by your physician to prevent side effects.    Return for Rash Follow Up in 4-6 weeks.  I, Jill Parcell, CMA, am acting as scribe for Lauraine JAYSON Kanaris, MD.   Documentation: I have reviewed the above documentation for accuracy and completeness, and I agree with the above.  Lauraine JAYSON Kanaris, MD

## 2024-06-28 ENCOUNTER — Encounter: Payer: Self-pay | Admitting: Internal Medicine

## 2024-07-11 ENCOUNTER — Telehealth: Payer: Self-pay | Admitting: Internal Medicine

## 2024-07-11 DIAGNOSIS — Z23 Encounter for immunization: Secondary | ICD-10-CM | POA: Diagnosis not present

## 2024-07-11 NOTE — Telephone Encounter (Signed)
 Pt received Flu vax today, dropped off that info for Dr Marylynn to view. It is in her color folder up front

## 2024-07-12 MED ORDER — NEBIVOLOL HCL 5 MG PO TABS: 5.0000 mg | ORAL_TABLET | Freq: Every day | ORAL | 0 refills | Status: DC

## 2024-07-12 NOTE — Telephone Encounter (Signed)
 Information updated in the chart. Pt stated she didn't want paperwork back.

## 2024-07-31 ENCOUNTER — Ambulatory Visit

## 2024-07-31 ENCOUNTER — Other Ambulatory Visit: Payer: Self-pay | Admitting: Internal Medicine

## 2024-07-31 DIAGNOSIS — L2084 Intrinsic (allergic) eczema: Secondary | ICD-10-CM | POA: Diagnosis not present

## 2024-07-31 MED ORDER — TRIAMCINOLONE ACETONIDE 0.1 % EX CREA: TOPICAL_CREAM | CUTANEOUS | 2 refills | Status: AC

## 2024-07-31 NOTE — Progress Notes (Signed)
    Subjective   [name] is a 73 y.o. female who presents for the following: Rash. Patient is established patient   Today patient reports: Patient states that rash had improved with triamcinolone . Still with intermittent flares but states clears with 2-3 days of triamcinolone . She states that is flared again about two days ago due to heat. She has an active rash on her neck, chest, and bilateral arms. She is currently using Triamcinolone , Clobetasol , and mupirocin  PRN.   Review of Systems:    No other skin or systemic complaints except as noted in HPI or Assessment and Plan.  The following portions of the chart were reviewed this encounter and updated as appropriate: medications, allergies, medical history  Relevant Medical History:  n/a   Objective  Well appearing patient in no apparent distress; mood and affect are within normal limits. Examination was performed of the: Sun Exposed Exam: Scalp, head, eyes, ears, nose, lips, neck, upper extremities, hands, fingers, fingernails  Examination notable for: faint erythematous papules on upper extremities  Examination limited by: Clothing and Patient deferred removal       Assessment & Plan   Rash of uncertain etiology - favor atopic dermatitis  Vs less likely ID reaction, ACD, medication reaction (amlodipine ), CTCL  - Previously discussed association between eczema and amlodipine . Prefers not to switch medication which is reasonable given well controlled with topical steroid prn. Discussed if flaring significantly in future could consider systemic med vs biopsy.  Chronic and persistent condition with duration or expected duration over one year. Condition is symptomatic and bothersome to patient. Patient is flaring and not currently at treatment goal.   - Diagnosis, treatment options, prognosis, risk/ benefit, and side effects of treatment were discussed with the patient.  - Reviewed benign but chronic nature of disease. - Discussed  dry skin care at length, recommended avoidance of fragrances, short showers with luke- warm water, no scrubbing, an unscented moisturizing soap (e.g. Dove sensitive skin) limited to the groin and axillae, and frequent emollient use (Eucerin, Aquaphor, Cerave, Vanicream, Vaseline). - Discussed treatment with topical steroids, non steroidal topicals, systemics (dupixent, tralokinumab, nemolizumab, rinvoq)  - Reviewed proper use of topical steroids to minimize the risk of steroid-induced skin changes.  - Also discussed appropriate dry skin care including daily warm baths with gentle soap, followed by liberal bland moisturizer application.  - Continue Triamcinolone  0.1% ointment twice daily to affected areas of skin. Stop once resolved and restart as needed for flares. Avoid use on face, armpits, groin unless otherwise indicated. - For worse stubborn areas start Clobetasol  0.05% ointment to affected area of skin twice daily. Stop once resolved and restart as needed for flares. Avoid use on face, armpits, groin unless otherwise indicated. - Discussed patch testing if rash becomes more prominent on the face or if it stops reacting to Triamcinolone .  Level of service outlined above   Procedures, orders, diagnosis for this visit:  INTRINSIC ATOPIC DERMATITIS    Intrinsic atopic dermatitis  Other orders -     Triamcinolone  Acetonide; Apply 7 gram twice daily to affected areas of skin. Stop once resolved and restart as needed for flares. Avoid use on face, armpits, groin unless otherwise indicated.  Dispense: 454 g; Refill: 2    Return to clinic: Return if symptoms worsen or fail to improve.  Documentation: I have reviewed the above documentation for accuracy and completeness, and I agree with the above.  Lauraine JAYSON Kanaris, MD

## 2024-07-31 NOTE — Patient Instructions (Signed)

## 2024-08-24 DIAGNOSIS — Z23 Encounter for immunization: Secondary | ICD-10-CM | POA: Diagnosis not present

## 2024-09-26 ENCOUNTER — Ambulatory Visit: Payer: PRIVATE HEALTH INSURANCE | Admitting: Dermatology

## 2024-10-08 ENCOUNTER — Other Ambulatory Visit: Payer: Self-pay | Admitting: Internal Medicine

## 2024-10-27 ENCOUNTER — Other Ambulatory Visit: Payer: Self-pay | Admitting: Internal Medicine

## 2024-10-30 NOTE — Telephone Encounter (Unsigned)
 Copied from CRM #[id number]. Topic: Appointments - Scheduling Inquiry for Clinic >> Oct 30, 2024 10:16 AM [name] wrote: Reason for CRM: Patient did go ahead and schedule an appt with Dr. Tullo for first avail 12/02/24. Can we proceed with her refill request?

## 2024-10-30 NOTE — Telephone Encounter (Signed)
 LMTCB

## 2024-11-05 ENCOUNTER — Other Ambulatory Visit: Payer: Self-pay | Admitting: Internal Medicine

## 2024-11-05 ENCOUNTER — Emergency Department
Admission: EM | Admit: 2024-11-05 | Discharge: 2024-11-05 | Disposition: A | Discharge status: Home / Self Care | Attending: Emergency Medicine | Admitting: Emergency Medicine

## 2024-11-05 ENCOUNTER — Other Ambulatory Visit: Payer: Self-pay

## 2024-11-05 DIAGNOSIS — R55 Syncope and collapse: Secondary | ICD-10-CM | POA: Insufficient documentation

## 2024-11-05 DIAGNOSIS — I10 Essential (primary) hypertension: Secondary | ICD-10-CM | POA: Insufficient documentation

## 2024-11-05 DIAGNOSIS — E876 Hypokalemia: Secondary | ICD-10-CM | POA: Insufficient documentation

## 2024-11-05 DIAGNOSIS — E778 Other disorders of glycoprotein metabolism: Secondary | ICD-10-CM | POA: Insufficient documentation

## 2024-11-05 LAB — CBC WITH DIFFERENTIAL/PLATELET
Abs Immature Granulocytes: 0.02 K/uL (ref 0.00–0.07)
Basophils Absolute: 0 K/uL (ref 0.0–0.1)
Basophils Relative: 0 %
Eosinophils Absolute: 0 K/uL (ref 0.0–0.5)
Eosinophils Relative: 0 %
HCT: 38.2 % (ref 36.0–46.0)
Hemoglobin: 12.5 g/dL (ref 12.0–15.0)
Immature Granulocytes: 0 %
Lymphocytes Relative: 4 %
Lymphs Abs: 0.3 K/uL — ABNORMAL LOW (ref 0.7–4.0)
MCH: 32 pg (ref 26.0–34.0)
MCHC: 32.7 g/dL (ref 30.0–36.0)
MCV: 97.7 fL (ref 80.0–100.0)
Monocytes Absolute: 0.2 K/uL (ref 0.1–1.0)
Monocytes Relative: 3 %
Neutro Abs: 7.1 K/uL (ref 1.7–7.7)
Neutrophils Relative %: 93 %
Platelets: 129 K/uL — ABNORMAL LOW (ref 150–400)
RBC: 3.91 MIL/uL (ref 3.87–5.11)
RDW: 12.7 % (ref 11.5–15.5)
WBC: 7.7 K/uL (ref 4.0–10.5)
nRBC: 0 % (ref 0.0–0.2)

## 2024-11-05 LAB — COMPREHENSIVE METABOLIC PANEL WITH GFR
ALT: 17 U/L (ref 0–44)
AST: 21 U/L (ref 15–41)
Albumin: 3.5 g/dL (ref 3.5–5.0)
Alkaline Phosphatase: 60 U/L (ref 38–126)
Anion gap: 9 (ref 5–15)
BUN: 17 mg/dL (ref 8–23)
CO2: 21 mmol/L — ABNORMAL LOW (ref 22–32)
Calcium: 7.2 mg/dL — ABNORMAL LOW (ref 8.9–10.3)
Chloride: 112 mmol/L — ABNORMAL HIGH (ref 98–111)
Creatinine, Ser: 0.71 mg/dL (ref 0.44–1.00)
GFR, Estimated: >60 mL/min
Glucose, Bld: 166 mg/dL — ABNORMAL HIGH (ref 70–99)
Potassium: 3.2 mmol/L — ABNORMAL LOW (ref 3.5–5.1)
Sodium: 141 mmol/L (ref 135–145)
Total Bilirubin: 0.2 mg/dL (ref 0.0–1.2)
Total Protein: 5.5 g/dL — ABNORMAL LOW (ref 6.5–8.1)

## 2024-11-05 LAB — TROPONIN T, HIGH SENSITIVITY: Troponin T High Sensitivity: <15 ng/L (ref 0–19)

## 2024-11-05 MED ORDER — POTASSIUM CHLORIDE 20 MEQ PO PACK
40.0000 meq | PACK | Freq: Once | ORAL | Status: AC
  Administered 2024-11-05: 40 meq via ORAL
  Filled 2024-11-05: qty 2

## 2024-11-05 NOTE — Discharge Instructions (Addendum)
 Please utilize any home health help that is offered to you as it seems you may not be getting enough nourishment.  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  The goal should be at least a multivitamin, calcium supplementation pill, and 2-3 Ensure/boost shakes per day. Please follow-up with your primary care physician for a repeat of your metabolic panel

## 2024-11-05 NOTE — ED Provider Notes (Signed)
 "  [facility] Provider Note   Event Date/Time   First MD Initiated Contact with Patient 11/05/24 1105     (approximate) History  Near Syncope HPI [name] is a 74 y.o. female with a stated past medical history of hyperlipidemia, hypertension, and kidney stones in the past who presents complaining of lightheadedness, bradycardia, and hypotension that was found by home health nurse prior to arrival.  Patient states that she believes this was secondary to a dairy allergy after she ate a muffin.  Patient states that this has occurred in the past after consuming dairy and resolved spontaneously.  Patient denies any loss of consciousness.  Patient received small amount of IV fluids in transport with EMS and was found to have normal vital signs upon arrival.  Patient denies any other complaints at this time ROS: Patient currently denies any vision changes, tinnitus, difficulty speaking, facial droop, sore throat, chest pain, shortness of breath, abdominal pain, nausea/vomiting/diarrhea, dysuria, or weakness/numbness/paresthesias in any extremity   Physical Exam  Triage Vital Signs: ED Triage Vitals  Encounter Vitals Group     BP      Girls Systolic BP Percentile      Girls Diastolic BP Percentile      Boys Systolic BP Percentile      Boys Diastolic BP Percentile      Pulse      Resp      Temp      Temp src      SpO2      Weight      Height      Head Circumference      Peak Flow      Pain Score      Pain Loc      Pain Education      Exclude from Growth Chart    Most recent vital signs: Vitals:   11/05/24 1200 11/05/24 1300  BP: 127/64 97/62  Pulse: 74 71  Resp:  16  Temp:    SpO2: 100% 100%   General: Awake, oriented x4. CV:  Good peripheral perfusion. Resp:  Normal effort. Abd:  No distention. Other:  Elderly well-developed, well-nourished Caucasian female resting comfortably in no acute distress ED Results / Procedures / Treatments  Labs (all  labs ordered are listed, but only abnormal results are displayed) Labs Reviewed  COMPREHENSIVE METABOLIC PANEL WITH GFR - Abnormal; Notable for the following components:      Result Value   Potassium 3.2 (*)    Chloride 112 (*)    CO2 21 (*)    Glucose, Bld 166 (*)    Calcium 7.2 (*)    Total Protein 5.5 (*)    All other components within normal limits  CBC WITH DIFFERENTIAL/PLATELET - Abnormal; Notable for the following components:   Platelets 129 (*)    Lymphs Abs 0.3 (*)    All other components within normal limits  TROPONIN T, HIGH SENSITIVITY  TROPONIN T, HIGH SENSITIVITY   EKG ED ECG REPORT I, Artist MARLA Kerns, the attending physician, personally viewed and interpreted this ECG. Date: 11/05/2024 EKG Time: 1130 Rate: 73 Rhythm: normal sinus rhythm QRS Axis: normal Intervals: normal ST/T Wave abnormalities: normal Narrative Interpretation: no evidence of acute ischemia PROCEDURES: Critical Care performed: No Procedures MEDICATIONS ORDERED IN ED: Medications  potassium chloride  (KLOR-CON ) packet 40 mEq (40 mEq Oral Given 11/05/24 1334)   IMPRESSION / MDM / ASSESSMENT AND PLAN / ED COURSE  I reviewed the triage vital  signs and the nursing notes.                             The patient is on the cardiac monitor to evaluate for evidence of arrhythmia and/or significant heart rate changes. Patient's presentation is most consistent with acute presentation with potential threat to life or bodily function. Patient is a 74 year old female with the above-stated past medical history presents complaining of presyncopal symptoms with low blood pressure and heart rate found by home health nurse was prior to arrival DDx: ACS, arrhythmia, dehydration, vasovagal reaction Plan: CBC, CMP, troponin, EKG  On laboratory evaluation, patient's metabolic panel concerning for mild malnutrition.  Patient has mild hypokalemia to 3.2, decreased calcium to 7.2, decreased protein to 5.5.  Patient  does admit that she has had decreased p.o. intake as she has been taking care of her husband with terminal cancer.  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  Patient was given strict return precautions and all questions were answered prior to discharge  Dispo: Discharge home with PCP follow-up   FINAL CLINICAL IMPRESSION(S) / ED DIAGNOSES   Final diagnoses:  Near syncope  Hypokalemia  Hypocalcemia  Hypoproteinemia   Rx / DC Orders   ED Discharge Orders     None      Note:  This document was prepared using Dragon voice recognition software and may include unintentional dictation errors.   Maritsa Hunsucker K, MD 11/05/24 1524  "

## 2024-11-05 NOTE — ED Triage Notes (Signed)
 Pt BIB [facility] from home for near syncopal episode. EMS reports initially hypotensive 90/38, bradycardic HR 50, improved to 134/79, HR 74 after 500 mL bolus. Reports initially pale, clammy, improved en route.   Pt reports increased stress at home d/t spouse on hospice/end of life and she is primary caretaker, has not slept for several days.

## 2024-11-05 NOTE — ED Notes (Signed)
AVS provided to and discussed with patient and family member at bedside. Pt verbalizes understanding of discharge instructions and denies any questions or concerns at this time. Pt has ride home. Pt ambulated out of department independently with steady gait.  

## 2024-11-08 ENCOUNTER — Telehealth: Payer: Self-pay

## 2024-11-08 NOTE — Transitions of Care (Post Inpatient/ED Visit) (Signed)
 "  11/08/2024  Name: [name] MRN: [id number] DOB: [birth year]  Today's TOC FU Call Status: Today's TOC FU Call Status:: Successful TOC FU Call Completed TOC FU Call Complete Date: 11/08/24  Patient's Name and Date of Birth confirmed. Name, DOB  Transition Care Management Follow-up Telephone Call Date of Discharge: 11/07/24 Discharge Facility: Other [facility]) Name of Other (Non[facility]) Discharge Facility: [facility] Type of Discharge: Inpatient Admission Primary Inpatient Discharge Diagnosis:: altered mental status How have you been since you were released from the hospital?: Better Any questions or concerns?: No  Items Reviewed: Did you receive and understand the discharge instructions provided?: Yes Medications obtained,verified, and reconciled?: Yes (Medications Reviewed) Any new allergies since your discharge?: No Dietary orders reviewed?: Yes Do you have support at home?: No  Medications Reviewed Today: Medications Reviewed Today     Reviewed by Emmitt Pan, LPN (Licensed Practical Nurse) on 11/08/24 at 1015  Med List Status: <None>   Medication Order Taking? Sig Documenting Provider Last Dose Status Informant  amLODipine  (NORVASC ) 2.5 MG tablet [id number] Yes TAKE 1 TABLET BY MOUTH  daily AT BEDTIME Marylynn Verneita CROME, MD  Active   clobetasol  ointment (TEMOVATE ) 0.05 % [id number] Yes Apply 1 gram topically to affected area of skin twice daily. Stop once resolved and restart as needed for flares. Avoid use on face, armpits, groin unless otherwise indicated. Raymund Lauraine JAYSON, MD  Active   Cyanocobalamin  (B-12) 1000 MCG TABS [id number] Yes  [provider]  Active   EPINEPHrine  0.3 mg/0.3 mL IJ SOAJ injection [id number] Yes Inject 0.3 mg into the muscle as needed for anaphylaxis. Bair, Kalpana, MD  Active   ezetimibe  (ZETIA ) 10 MG tablet [id number] Yes TAKE ONE TABLET BY MOUTH ONE TIME DAILY Marylynn Verneita CROME, MD  Active   hydrOXYzine  (VISTARIL ) 25 MG capsule [id number]  Yes Take 1 capsule (25 mg total) by mouth daily as needed. Bair, Luke, MD  Active   mupirocin  ointment (BACTROBAN ) 2 % [id number] Yes Place 1 Application into the nose 2 (two) times daily. As needed Marylynn Verneita CROME, MD  Active   nebivolol  (BYSTOLIC ) 5 MG tablet [id number]  Take 1 tablet (5 mg total) by mouth daily.  Patient not taking: Reported on 11/08/2024   Marylynn Verneita CROME, MD  Active   raloxifene  (EVISTA ) 60 MG tablet [id number] Yes TAKE ONE TABLET BY MOUTH ONCE A DAY Marylynn Verneita CROME, MD  Active   triamcinolone  (NASACORT ) 55 MCG/ACT AERO nasal inhaler [id number] Yes Place 2 sprays into the nose as needed. [provider]  Active Self  triamcinolone  cream (KENALOG ) 0.1 % [id number] Yes Apply 7 gram twice daily to affected areas of skin. Stop once resolved and restart as needed for flares. Avoid use on face, armpits, groin unless otherwise indicated. Raymund Lauraine JAYSON, MD  Active   triamcinolone  ointment (KENALOG ) 0.1 % [id number] Yes Apply 7 grams twice daily to affected areas of skin. Stop once resolved and restart as needed for flares. Avoid use on face, armpits, groin unless otherwise indicated. Raymund Lauraine JAYSON, MD  Active   vitamin E 200 UNIT capsule [id number] Yes Take 200 Units by mouth daily. [provider]  Active             Home Care and Equipment/Supplies: Were Home Health Services Ordered?: NA Any new equipment or medical supplies ordered?: NA  Functional Questionnaire: Do you need assistance with bathing/showering or dressing?: No Do you need assistance with meal preparation?: No Do you need assistance with eating?:  No Do you have difficulty maintaining continence: No Do you need assistance with getting out of bed/getting out of a chair/moving?: No Do you have difficulty managing or taking your medications?: No  Follow up appointments reviewed: PCP Follow-up appointment confirmed?: Yes Date of PCP follow-up appointment?: 11/12/24 Follow-up Provider:  [facility] Follow-up appointment confirmed?: NA Do you need transportation to your follow-up appointment?: No Do you understand care options if your condition(s) worsen?: Yes-patient verbalized understanding    SIGNATURE Julian Lemmings, LPN [facility] Nurse Health Advisor Direct Dial [phone]  "

## 2024-11-12 ENCOUNTER — Encounter: Payer: Self-pay | Admitting: Internal Medicine

## 2024-11-12 ENCOUNTER — Telehealth: Admitting: Internal Medicine

## 2024-11-12 VITALS — Ht 61.0 in | Wt 135.0 lb

## 2024-11-12 DIAGNOSIS — R5383 Other fatigue: Secondary | ICD-10-CM | POA: Diagnosis not present

## 2024-11-12 DIAGNOSIS — Z09 Encounter for follow-up examination after completed treatment for conditions other than malignant neoplasm: Secondary | ICD-10-CM

## 2024-11-12 MED ORDER — SERTRALINE HCL 25 MG PO TABS: 25.0000 mg | ORAL_TABLET | Freq: Every day | ORAL | 3 refills | Status: AC

## 2024-11-12 NOTE — Progress Notes (Unsigned)
 Virtual Visit via Caregility   Note   This format is felt to be most appropriate for this patient at this time.  All issues noted in this document were discussed and addressed.  No physical exam was performed (except for noted visual exam findings with Video Visits).   I connected withNAME@ on 11/12/24 at  5:00 PM EST by a video enabled telemedicine application or telephone and verified that I am speaking with the correct person using two identifiers. Location patient: home Location provider: work or home office Persons participating in the virtual visit: patient, provider  I discussed the limitations, risks, security and privacy concerns of performing an evaluation and management service by telephone and the availability of in person appointments. I also discussed with the patient that there may be a patient responsible charge related to this service. The patient expressed understanding and agreed to proceed.  Interactive audio and video telecommunications were attempted between this provider and patient, however failed, due to patient having technical difficulties OR patient did not have access to video capability.  We continued and completed visit with audio only. ***  Reason for visit: hospital follow up  HPI: Colin was was admitted to [facility] on 11/05/2024 for 2 episodes of intermittent altered mental status with delayed speech and word finding difficulties with full return to baseline concerning for delirium in the setting of severe fatigue.  Patient experienced 2 episodes of slow speech, word finding difficulties, general confusion, and drowsiness lasting between a few minutes to an hour in the past 24 hours. With report from daughter, this was really one event lasting around 4 hours and then after sleep, her mother was at her baseline. Patient can remember this event and states she was struggling to find the correct words due to fatigue, in a video she is heard speaking fluently, but stutters on  finding the correct word for a few seconds, then circles back to what she wants to say   CT head was done and noted Bilateral white matter hypodensities noted suggesting moderate chronic microvascular ischemic changes. Intracranial atherosclerosis. Moderate brain volume loss particularly of the bilateral medial temporal lobes. No hydrocephalus or evidence of NPH   MRI noted These are nonspecific but commonly seen with chronic microvascular ischemic disease. 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 Frontal and parietal predominant parenchymal volume loss   Initial infectious, metabolic, and stroke work-up of CTH WO and MRI Brain WWO completed at [facility] negative. No acute abnormalities note, but chronic microvascular ischemic disease and frontoparietal predominant parenchymal volume loss was noted on MRI Brain. Transferred to [facility] for further assessment and work-up. NIHSS 0 on arrival on [facility]. 24 hour continuous EEG administered starting 1/14 for attempt to capture episode to rule in/out seizure, results notable for Intermittent delta slowing maximal over the temporal regions independently and generalized rhythmic delta activity most likely indicating non-specific mild degree of encephalopathy. No epileptiform discharges or seizures noted. At this time, episodes thought to be most likely 2/2 exhaustion/sleep deprivation and stress triggering acute delirium iso patient with moderate to severe frontoparietal predominant parenchymal volume loss/atrophy.   Since discharge from [facility] family agrees that her mental status has improved. Patient refuses to believe that she was incoherent  ROS: See pertinent positives and negatives per HPI.  Past Medical History:  Diagnosis Date   Adenomatous polyps 2010   colon   Allergy see chart   Arthritis see chart   Cataract June & July 2021   Chicken pox  Complication of anesthesia    took a while to wake up from lithotripsy   GERD  (gastroesophageal reflux disease) see chart   Hyperlipidemia    Lumbar Fusion   Hypertension see chart   Kidney stone    Lymphadenopathy, axillary 02/25/2020   Pneumonia     Past Surgical History:  Procedure Laterality Date   CATARACT EXTRACTION W/PHACO Left 05/13/2020   Procedure: CATARACT EXTRACTION PHACO AND INTRAOCULAR LENS PLACEMENT (IOC) LEFT;  Surgeon: Mittie Gaskin, MD;  Location: [facility];  Service: Ophthalmology;  Laterality: Left;  6.61 0:53.0 12.4%   CATARACT EXTRACTION W/PHACO Right 06/10/2020   Procedure: CATARACT EXTRACTION PHACO AND INTRAOCULAR LENS PLACEMENT (IOC) RIGHT VIVITY TORIC LENS;  Surgeon: Mittie Gaskin, MD;  Location: [facility];  Service: Ophthalmology;  Laterality: Right;  9.04 1:12.8 12.4%   COLONOSCOPY  2017   COLONOSCOPY WITH PROPOFOL  N/A 01/05/2021   Procedure: COLONOSCOPY WITH PROPOFOL ;  Surgeon: Jinny Carmine, MD;  Location: [facility];  Service: Endoscopy;  Laterality: N/A;   EXTRACORPOREAL SHOCK WAVE LITHOTRIPSY Left 03/28/2019   Procedure: EXTRACORPOREAL SHOCK WAVE LITHOTRIPSY (ESWL);  Surgeon: Penne Knee, MD;  Location: [facility];  Service: Urology;  Laterality: Left;   EYE SURGERY  Cataract surgery, L & R eyes   SPINE SURGERY  March 31, 2011   Lumbar Spine Surgery L 4-5    Family History  Problem Relation Age of Onset   Cancer Mother    Stroke Mother    Heart disease Mother    Hypertension Mother    Diabetes Mother    Breast cancer Mother 6   Heart disease Father    Hypertension Father    Breast cancer Sister    Cancer Sister    Hypertension Sister    Heart disease Brother    Hypertension Brother    Stroke Brother    Varicose Veins Sister     SOCIAL HX: ***  Current Medications[1]  EXAM:  VITALS per patient if applicable:  GENERAL: alert, oriented, appears well and in no acute distress  HEENT: atraumatic, conjunttiva clear, no obvious abnormalities on inspection of external nose  and ears  NECK: normal movements of the head and neck  LUNGS: on inspection no signs of respiratory distress, breathing rate appears normal, no obvious gross SOB, gasping or wheezing  CV: no obvious cyanosis  MS: moves all visible extremities without noticeable abnormality  PSYCH/NEURO: pleasant and cooperative, no obvious depression or anxiety, speech and thought processing grossly intact  ASSESSMENT AND PLAN: There are no diagnoses linked to this encounter.    I discussed the assessment and treatment plan with the patient. The patient was provided an opportunity to ask questions and all were answered. The patient agreed with the plan and demonstrated an understanding of the instructions.   The patient was advised to call back or seek an in-person evaluation if the symptoms worsen or if the condition fails to improve as anticipated.   I spent 30 minutes dedicated to the care of this patient on the date of this encounter to include pre-visit review of patient's medical history,  including recent ER visit, imaging studies and labs, face-to-face time with the patient , and post visit ordering of testing and therapeutics.    Verneita LITTIE Kettering, MD     [1]  Current Outpatient Medications:    amLODipine  (NORVASC ) 2.5 MG tablet, TAKE 1 TABLET BY MOUTH  daily AT BEDTIME, Disp: 90 tablet, Rfl: 1   Cholecalciferol (VITAMIN D3) 1000 units CAPS, ,  Disp: , Rfl:    clobetasol  ointment (TEMOVATE ) 0.05 %, Apply 1 gram topically to affected area of skin twice daily. Stop once resolved and restart as needed for flares. Avoid use on face, armpits, groin unless otherwise indicated., Disp: 60 g, Rfl: 5   Cyanocobalamin  (B-12) 1000 MCG TABS, , Disp: , Rfl:    EPINEPHrine  0.3 mg/0.3 mL IJ SOAJ injection, Inject 0.3 mg into the muscle as needed for anaphylaxis., Disp: 1 each, Rfl: 0   ezetimibe  (ZETIA ) 10 MG tablet, TAKE ONE TABLET BY MOUTH ONE TIME DAILY, Disp: 90 tablet, Rfl: 1   hydrOXYzine  (VISTARIL ) 25  MG capsule, Take 1 capsule (25 mg total) by mouth daily as needed., Disp: 15 capsule, Rfl: 0   Melatonin (MELATONIN EXTRA STRENGTH) 10 MG TABS, , Disp: , Rfl:    raloxifene  (EVISTA ) 60 MG tablet, TAKE ONE TABLET BY MOUTH ONCE A DAY, Disp: 90 tablet, Rfl: 3   triamcinolone  (NASACORT ) 55 MCG/ACT AERO nasal inhaler, Place 2 sprays into the nose as needed., Disp: , Rfl:    triamcinolone  cream (KENALOG ) 0.1 %, Apply 7 gram twice daily to affected areas of skin. Stop once resolved and restart as needed for flares. Avoid use on face, armpits, groin unless otherwise indicated., Disp: 454 g, Rfl: 2   vitamin E 200 UNIT capsule, Take 200 Units by mouth daily., Disp: , Rfl:

## 2024-11-13 DIAGNOSIS — Z09 Encounter for follow-up examination after completed treatment for conditions other than malignant neoplasm: Secondary | ICD-10-CM | POA: Insufficient documentation

## 2024-11-13 NOTE — Assessment & Plan Note (Addendum)
 Resulting in sleep deprivation and delirium . She has been unable or unwilling to delegate responsibilities and as a result is overwhelmed and not functioning well.  She is willing to begin a trial of sertraline .

## 2024-11-13 NOTE — Assessment & Plan Note (Addendum)
 Patient is stable post discharge from [facility] hospitalization for  delirium  from Jan 13 to Jan 15   and has no new issues or questions about discharge plans at the visit today for hospital follow up.  I have reviewed the records from the hospital admission in detail with patient today. And completed a medication reconciliation.

## 2024-11-15 ENCOUNTER — Other Ambulatory Visit: Payer: Self-pay | Admitting: Internal Medicine

## 2024-11-19 ENCOUNTER — Inpatient Hospital Stay: Admitting: Internal Medicine

## 2024-12-02 ENCOUNTER — Ambulatory Visit: Admitting: Internal Medicine

## 2024-12-03 ENCOUNTER — Ambulatory Visit: Payer: PRIVATE HEALTH INSURANCE | Admitting: Dermatology

## 2025-03-12 ENCOUNTER — Ambulatory Visit: Admitting: Urology
